# Patient Record
Sex: Male | Born: 1945 | Race: White | Hispanic: No | Marital: Single | Smoking: Former smoker
Health system: Southern US, Community
[De-identification: ages and names within clinical notes are randomized; demographics above are authoritative.]

## PROBLEM LIST (undated history)

## (undated) DIAGNOSIS — N200 Calculus of kidney: Secondary | ICD-10-CM

## (undated) DIAGNOSIS — I639 Cerebral infarction, unspecified: Secondary | ICD-10-CM

## (undated) DIAGNOSIS — I2510 Atherosclerotic heart disease of native coronary artery without angina pectoris: Secondary | ICD-10-CM

## (undated) DIAGNOSIS — I472 Ventricular tachycardia, unspecified: Secondary | ICD-10-CM

## (undated) DIAGNOSIS — I10 Essential (primary) hypertension: Secondary | ICD-10-CM

## (undated) DIAGNOSIS — Z95810 Presence of automatic (implantable) cardiac defibrillator: Secondary | ICD-10-CM

## (undated) DIAGNOSIS — Z9889 Other specified postprocedural states: Secondary | ICD-10-CM

## (undated) DIAGNOSIS — I4891 Unspecified atrial fibrillation: Secondary | ICD-10-CM

## (undated) HISTORY — PX: CARDIAC CATHETERIZATION: SHX172

## (undated) HISTORY — DX: Atherosclerotic heart disease of native coronary artery without angina pectoris: I25.10

## (undated) HISTORY — PX: CORONARY ANGIOPLASTY: SHX604

## (undated) HISTORY — PX: ICD IMPLANT: EP1208

---

## 2007-03-10 HISTORY — PX: OTHER SURGICAL HISTORY: SHX169

## 2014-02-06 DEATH — deceased

## 2014-10-16 HISTORY — PX: MITRAL VALVE REPAIR: SHX2039

## 2015-01-21 ENCOUNTER — Encounter: Payer: Self-pay | Admitting: *Deleted

## 2015-01-21 ENCOUNTER — Encounter: Payer: Medicare Other | Attending: Internal Medicine | Admitting: *Deleted

## 2015-01-21 VITALS — BP 110/60 | HR 43 | Ht 69.4 in | Wt 184.6 lb

## 2015-01-21 DIAGNOSIS — Z9889 Other specified postprocedural states: Secondary | ICD-10-CM | POA: Diagnosis not present

## 2015-01-21 NOTE — Progress Notes (Signed)
Cardiac Individual Treatment Plan  Patient Details  Name: Darius Miller MRN: [id number] Date of Birth: [birth year] Referring Provider:  Alwyn Pea, MD  Initial Encounter Date: Date: 01/21/15  Visit Diagnosis: s/p Mitral Valve repair  Patient's Home Medications on Admission:  Current outpatient prescriptions:  .  aspirin 81 MG tablet, Take 81 mg by mouth daily., Disp: , Rfl:  .  atorvastatin (LIPITOR) 40 MG tablet, Take 40 mg by mouth daily., Disp: , Rfl:  .  dabigatran (PRADAXA) 150 MG CAPS capsule, Take 150 mg by mouth 2 (two) times daily., Disp: , Rfl:  .  digoxin (LANOXIN) 0.125 MG tablet, Take 0.25 mg by mouth daily., Disp: , Rfl:  .  furosemide (LASIX) 40 MG tablet, Take 40 mg by mouth., Disp: , Rfl:  .  lisinopril (PRINIVIL,ZESTRIL) 2.5 MG tablet, Take 2.5 mg by mouth daily., Disp: , Rfl:  .  metoprolol succinate (TOPROL-XL) 25 MG 24 hr tablet, Take 12.5 mg by mouth daily., Disp: , Rfl:   Past Medical History: Past Medical History  Diagnosis Date  . Coronary artery disease     Tobacco Use: History  Smoking status  . Former Smoker -- 2.00 packs/day for 20 years  . Types: Cigarettes  Smokeless tobacco  . Not on file    Labs: Recent Review Flowsheet Data    There is no flowsheet data to display.       Exercise Target Goals: Date: 01/21/15  Exercise Program Goal: Individual exercise prescription set with THRR, safety & activity barriers. Participant demonstrates ability to understand and report RPE using BORG scale, to self-measure pulse accurately, and to acknowledge the importance of the exercise prescription.  Exercise Prescription Goal: Starting with aerobic activity 30 plus minutes a day, 3 days per week for initial exercise prescription. Provide home exercise prescription and guidelines that participant acknowledges understanding prior to discharge.  Activity Barriers & Risk Stratification:     Activity Barriers & Risk Stratification - 01/21/15  1454    Activity Barriers & Risk Stratification   Activity Barriers Arthritis   Risk Stratification High      6 Minute Walk:     6 Minute Walk      01/21/15 1342       6 Minute Walk   Phase Initial     Distance 1185 feet     Walk Time 6 minutes     Resting HR 43 bpm     Resting BP 110/60 mmHg     Max Ex. HR 84 bpm     Max Ex. BP 114/62 mmHg     RPE 11     Symptoms No        Initial Exercise Prescription:     Initial Exercise Prescription - 01/21/15 1300    Date of Initial Exercise Prescription   Date 01/21/15   Treadmill   MPH 2   Grade 0   Minutes 10   Bike   Level 0.4   Watts 15   Minutes 10   Recumbant Bike   Level 3   RPM 40   Watts 25   Minutes 15   NuStep   Level 3   Watts 25   Minutes 15   Arm Ergometer   Level 1   Watts 8   Minutes 10   Arm/Foot Ergometer   Level 4   Watts 15   Minutes 10   Cybex   Level 2   RPM 50   Minutes 10  Recumbant Elliptical   Level 1   RPM 40   Watts 10   Minutes 10   Elliptical   Level 1   Speed 3   Minutes 1   REL-XR   Level 2   Watts 35   Minutes 10   Prescription Details   Frequency (times per week) 3   Duration Progress to 30 minutes of continuous aerobic without signs/symptoms of physical distress   Intensity   THRR REST +  20   Ratings of Perceived Exertion 11-15   Progression Continue progressive overload as per policy without signs/symptoms or physical distress.   Resistance Training   Training Prescription Yes   Weight 2   Reps 10-15      Exercise Prescription Changes:   Discharge Exercise Prescription (Final Exercise Prescription Changes):   Nutrition:  Target Goals: Understanding of nutrition guidelines, daily intake of sodium 1500mg , cholesterol 200mg , calories 30% from fat and 7% or less from saturated fats, daily to have 5 or more servings of fruits and vegetables.  Biometrics:     Pre Biometrics - 01/21/15 1335    Pre Biometrics   Height 5' 9.4" (1.763 m)    Weight 184 lb 9.6 oz (83.734 kg)   Waist Circumference 39.75 inches   Hip Circumference 40 inches   Waist to Hip Ratio 0.99 %   BMI (Calculated) 27       Nutrition Therapy Plan and Nutrition Goals:     Nutrition Therapy & Goals - 01/21/15 1458    Nutrition Therapy   Drug/Food Interactions --  Darius Miller prefers not to meet individually with the Cardiac Rehab registered dietician.       Nutrition Discharge: Rate Your Plate Scores:   Nutrition Goals Re-Evaluation:   Psychosocial: Target Goals: Acknowledge presence or absence of depression, maximize coping skills, provide positive support system. Participant is able to verbalize types and ability to use techniques and skills needed for reducing stress and depression.  Initial Review & Psychosocial Screening:     Initial Psych Review & Screening - 01/21/15 1458    Initial Review   Current issues with Current Stress Concerns   Source of Stress Concerns Retirement/disability   Comments D[place] reported he was giving his wife info to relay when he was in the hospital since they were selling their house in [place] to move closer to [place] and [place]. He had to sell his mother's dining room set since moved from a 3 bedroom home to a 2 bedroom condo since he didn't feel he could keep up with the yeardwork etc. 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    Family Dynamics   Good Support System? Yes   Barriers   Psychosocial barriers to participate in program The patient should benefit from training in stress management and relaxation.   Screening Interventions   Interventions Encouraged to exercise      Quality of Life Scores:   PHQ-9:     Recent Review Flowsheet Data    Depression screen [facility] 2/9 01/21/2015   Decreased Interest 0   Down, Depressed, Hopeless 0   PHQ - 2 Score 0   Altered sleeping 0   Tired, decreased energy 3   Change in appetite 0   Feeling bad or  failure about yourself  0   Trouble concentrating 0   Moving slowly or fidgety/restless 0   Suicidal thoughts 0   PHQ-9 Score 3  Difficult doing work/chores Somewhat difficult      Psychosocial Evaluation and Intervention:   Psychosocial Re-Evaluation:   Vocational Rehabilitation: Provide vocational rehab assistance to qualifying candidates.   Vocational Rehab Evaluation & Intervention:     Vocational Rehab - 01/21/15 1405    Initial Vocational Rehab Evaluation & Intervention   Assessment shows need for Vocational Rehabilitation (p) No      Education: Education Goals: Education classes will be provided on a weekly basis, covering required topics. Participant will state understanding/return demonstration of topics presented.  Learning Barriers/Preferences:     Learning Barriers/Preferences - 01/21/15 1455    Learning Barriers/Preferences   Learning Barriers None   Learning Preferences None      Education Topics: General Nutrition Guidelines/Fats and Fiber: -Group instruction provided by verbal, written material, models and posters to present the general guidelines for heart healthy nutrition. Gives an explanation and review of dietary fats and fiber.   Controlling Sodium/Reading Food Labels: -Group verbal and written material supporting the discussion of sodium use in heart healthy nutrition. Review and explanation with models, verbal and written materials for utilization of the food label.   Exercise Physiology & Risk Factors: - Group verbal and written instruction with models to review the exercise physiology of the cardiovascular system and associated critical values. Details cardiovascular disease risk factors and the goals associated with each risk factor.   Aerobic Exercise & Resistance Training: - Gives group verbal and written discussion on the health impact of inactivity. On the components of aerobic and resistive training programs and the benefits of  this training and how to safely progress through these programs.   Flexibility, Balance, General Exercise Guidelines: - Provides group verbal and written instruction on the benefits of flexibility and balance training programs. Provides general exercise guidelines with specific guidelines to those with heart or lung disease. Demonstration and skill practice provided.   Stress Management: - Provides group verbal and written instruction about the health risks of elevated stress, cause of high stress, and healthy ways to reduce stress.   Depression: - Provides group verbal and written instruction on the correlation between heart/lung disease and depressed mood, treatment options, and the stigmas associated with seeking treatment.   Anatomy & Physiology of the Heart: - Group verbal and written instruction and models provide basic cardiac anatomy and physiology, with the coronary electrical and arterial systems. Review of: AMI, Angina, Valve disease, Heart Failure, Cardiac Arrhythmia, Pacemakers, and the ICD.   Cardiac Procedures: - Group verbal and written instruction and models to describe the testing methods done to diagnose heart disease. Reviews the outcomes of the test results. Describes the treatment choices: Medical Management, Angioplasty, or Coronary Bypass Surgery.   Cardiac Medications: - Group verbal and written instruction to review commonly prescribed medications for heart disease. Reviews the medication, class of the drug, and side effects. Includes the steps to properly store meds and maintain the prescription regimen.   Go Sex-Intimacy & Heart Disease, Get SMART - Goal Setting: - Group verbal and written instruction through game format to discuss heart disease and the return to sexual intimacy. Provides group verbal and written material to discuss and apply goal setting through the application of the S.M.A.R.T. Method.   Other Matters of the Heart: - Provides group  verbal, written materials and models to describe Heart Failure, Angina, Valve Disease, and Diabetes in the realm of heart disease. Includes description of the disease process and treatment options available to the cardiac patient.   Exercise &  Equipment Safety: - Individual verbal instruction and demonstration of equipment use and safety with use of the equipment.          Cardiac Rehab from 01/21/2015 in [facility]   Date  01/21/15   Educator  C. Amabel Stmarie,.RN   Instruction Review Code  2- meets goals/outcomes      Infection Prevention: - Provides verbal and written material to individual with discussion of infection control including proper hand washing and proper equipment cleaning during exercise session.      Cardiac Rehab from 01/21/2015 in [facility] Cardiac Rehab   Date  01/21/15   Educator  C. Woody Kronberg,RN   Instruction Review Code  2- meets goals/outcomes      Falls Prevention: - Provides verbal and written material to individual with discussion of falls prevention and safety.      Cardiac Rehab from 01/21/2015 in [facility] Cardiac [facility]   Date  01/21/15   Educator  C. EnterkinRN   Instruction Review Code  2- meets goals/outcomes      Diabetes: - Individual verbal and written instruction to review signs/symptoms of diabetes, desired ranges of glucose level fasting, after meals and with exercise. Advice that pre and post exercise glucose checks will be done for 3 sessions at entry of program.    Knowledge Questionnaire Score:     Knowledge Questionnaire Score - 01/21/15 1452    Knowledge Questionnaire Score   Pre Score 26      Personal Goals and Risk Factors at Admission:   Personal Goals and Risk Factors Review:    Personal Goals Discharge (Final Personal Goals and Risk Factors Review):     Comments:  Orientation/Medical Review appt 6 minute walk test done today with Fayrene Fearing on the telemetry monitor during that time and when we were explaining the program to  him. Spent over 30 minutes with him. Heart rate was 43 upon arrival so faxed info to Dr. Juliann Pares plus gave Fayrene Fearing a copy to carry to his MD since Trevonne reports he gets dizzy at times when he is leaning over tying his shoe etc.

## 2015-01-21 NOTE — Progress Notes (Signed)
Daily Session Note  Patient Details  Name: Darius Miller MRN: [id number] Date of Birth: January 12, 1946 Referring Provider:  Yolonda Kida, MD  Encounter Date: 01/21/2015  Check In:     Session Check In - 01/21/15 1452    Check-In   Staff Present Gerlene Burdock RN, BSN;Renee Dillard Essex MS, ACSM CEP Exercise Physiologist   ER physicians immediately available to respond to emergencies See telemetry face sheet for immediately available ER MD   Medication changes reported     No   Fall or balance concerns reported    No   Warm-up and Cool-down Not performed (comment)  6 min walk test done in the hallway.   VAD Patient? No   Pain Assessment   Currently in Pain? No/denies         Goals Met:  Personal goals reviewed No report of cardiac concerns or symptoms  Goals Unmet:  Not Applicable  Goals Comments: Orientation/Medical Review appt 6 minute walk test done today with Jeneen Rinks on the telemetry monitor during that time and when we were explaining the program to him. Spent over 30 minutes with him.    Dr. Emily Filbert is Medical Director for [facility] and [facility].

## 2015-01-21 NOTE — Patient Instructions (Signed)
Patient Instructions  Patient Details  Name: Darius Miller MRN: [id number] Date of Birth: [birth year] Referring Provider:  Alwyn Pea, MD  Below are the personal goals you chose as well as exercise and nutrition goals. Our goal is to help you keep on track towards obtaining and maintaining your goals. We will be discussing your progress on these goals with you throughout the program.  Initial Exercise Prescription:     Initial Exercise Prescription - 01/21/15 1300    Date of Initial Exercise Prescription   Date 01/21/15   Treadmill   MPH 2   Grade 0   Minutes 10   Bike   Level 0.4   Watts 15   Minutes 10   Recumbant Bike   Level 3   RPM 40   Watts 25   Minutes 15   NuStep   Level 3   Watts 25   Minutes 15   Arm Ergometer   Level 1   Watts 8   Minutes 10   Arm/Foot Ergometer   Level 4   Watts 15   Minutes 10   Cybex   Level 2   RPM 50   Minutes 10   Recumbant Elliptical   Level 1   RPM 40   Watts 10   Minutes 10   Elliptical   Level 1   Speed 3   Minutes 1   REL-XR   Level 2   Watts 35   Minutes 10   Prescription Details   Frequency (times per week) 3   Duration Progress to 30 minutes of continuous aerobic without signs/symptoms of physical distress   Intensity   THRR REST +  20   Ratings of Perceived Exertion 11-15   Progression Continue progressive overload as per policy without signs/symptoms or physical distress.   Resistance Training   Training Prescription Yes   Weight 2   Reps 10-15      Exercise Goals: Frequency: Be able to perform aerobic exercise three times per week working toward 3-5 days per week.  Intensity: Work with a perceived exertion of 11 (fairly light) - 15 (hard) as tolerated. Follow your new exercise prescription and watch for changes in prescription as you progress with the program. Changes will be reviewed with you when they are made.  Duration: You should be able to do 30 minutes of continuous aerobic  exercise in addition to a 5 minute warm-up and a 5 minute cool-down routine.  Nutrition Goals: Your personal nutrition goals will be established when you do your nutrition analysis with the dietician.  The following are nutrition guidelines to follow: Cholesterol < 200mg /day Sodium < 1500mg /day Fiber: Men over 50 yrs - 30 grams per day  Personal Goals:   Tobacco Use Initial Evaluation: History  Smoking status  . Former Smoker -- 2.00 packs/day for 20 years  . Types: Cigarettes  Smokeless tobacco  . Not on file    Copy of goals given to participant.

## 2015-01-23 ENCOUNTER — Encounter: Payer: Medicare Other | Admitting: *Deleted

## 2015-01-23 DIAGNOSIS — Z9889 Other specified postprocedural states: Secondary | ICD-10-CM | POA: Diagnosis not present

## 2015-01-23 NOTE — Progress Notes (Signed)
Daily Session Note  Patient Details  Name: Maxwel Meadowcroft MRN: [id number] Date of Birth: Mar 28, 1945 Referring Provider:  Yolonda Kida, MD  Encounter Date: 01/23/2015  Check In:     Session Check In - 01/23/15 1603    Check-In   Staff Present Candiss Norse MS, ACSM CEP Exercise Physiologist;Carroll Enterkin RN, BSN;Diane Joya Gaskins RN, BSN   ER physicians immediately available to respond to emergencies See telemetry face sheet for immediately available ER MD   Medication changes reported     No   Fall or balance concerns reported    No   Warm-up and Cool-down Performed on first and last piece of equipment   VAD Patient? No   Pain Assessment   Currently in Pain? No/denies   Pain Score 0-No pain         Goals Met:  Exercise tolerated well No report of cardiac concerns or symptoms Strength training completed today  Goals Unmet:  Not Applicable  Goals Comments:  Patient here today for education and exercise.  Tolerated session well.    Dr. Emily Filbert is Medical Director for [place] and [facility].

## 2015-01-24 DIAGNOSIS — Z9889 Other specified postprocedural states: Secondary | ICD-10-CM | POA: Diagnosis not present

## 2015-01-24 NOTE — Progress Notes (Signed)
Daily Session Note  Patient Details  Name: Darius Miller MRN: [id number] Date of Birth: Nov 17, 1945 Referring Provider:  Yolonda Kida, MD  Encounter Date: 01/24/2015  Check In:     Session Check In - 01/24/15 1609    Check-In   Staff Present Lestine Box BS, ACSM EP-C, Exercise Physiologist;Carroll Enterkin RN, BSN;Other   ER physicians immediately available to respond to emergencies See telemetry face sheet for immediately available ER MD   Medication changes reported     No   Fall or balance concerns reported    No   Warm-up and Cool-down Performed on first and last piece of equipment   VAD Patient? No   Pain Assessment   Currently in Pain? No/denies         Goals Met:  Proper associated with RPD/PD & O2 Sat Exercise tolerated well No report of cardiac concerns or symptoms Strength training completed today  Goals Unmet:  Not Applicable  Goals Comments:   Dr. Emily Filbert is Medical Director for [place] and [facility].

## 2015-01-28 ENCOUNTER — Encounter: Payer: Medicare Other | Admitting: *Deleted

## 2015-01-28 DIAGNOSIS — Z9889 Other specified postprocedural states: Secondary | ICD-10-CM | POA: Diagnosis not present

## 2015-01-28 NOTE — Progress Notes (Signed)
Daily Session Note  Patient Details  Name: Darius Miller MRN: [id number] Date of Birth: Jan 28, 1946 Referring Provider:  Yolonda Kida, MD  Encounter Date: 01/28/2015  Check In:     Session Check In - 01/28/15 1607    Check-In   Staff Present Heath Lark, RN, BSN, CCRP;Talha Iser, RN, BSN;Mary Kellie Shropshire, RN   ER physicians immediately available to respond to emergencies See telemetry face sheet for immediately available ER MD   Medication changes reported     No   Fall or balance concerns reported    No   Warm-up and Cool-down Performed on first and last piece of equipment   VAD Patient? No   Pain Assessment   Currently in Pain? No/denies         Goals Met:  Proper associated with RPD/PD & O2 Sat Exercise tolerated well  Goals Unmet:  Not Applicable  Goals Comments: MD told him to take 1/4 of Metoprolol and 1/2 of Digoxin.    Dr. Emily Filbert is Medical Director for [place] and [facility].

## 2015-01-30 ENCOUNTER — Encounter: Payer: Medicare Other | Admitting: *Deleted

## 2015-01-30 DIAGNOSIS — Z9889 Other specified postprocedural states: Secondary | ICD-10-CM

## 2015-01-30 NOTE — Progress Notes (Signed)
Daily Session Note  Patient Details  Name: Darius Miller MRN: [id number] Date of Birth: 1946-01-08 Referring Provider:  Yolonda Kida, MD  Encounter Date: 01/30/2015  Check In:     Session Check In - 01/30/15 1605    Check-In   Staff Present Gerlene Burdock, RN, BSN;Darric Plante Joya Gaskins, RN, BSN   ER physicians immediately available to respond to emergencies See telemetry face sheet for immediately available ER MD   Medication changes reported     Yes   Fall or balance concerns reported    No   Warm-up and Cool-down Performed on first and last piece of equipment   VAD Patient? No   Pain Assessment   Currently in Pain? No/denies         Goals Met:  Exercise tolerated well No report of cardiac concerns or symptoms Strength training completed today with no weights.  Patient stated the 2 pound weights bothered his leg and hip from the last session due to arthritis in his knees.    Goals Unmet:  Not Applicable  Goals Comments: Patient reports having stopped Digoxin.  MD discontinued this med.  Patient states his breathing is better at night since he started this program.    Dr. Emily Filbert is Medical Director for [place] and [facility].

## 2015-02-04 DIAGNOSIS — Z9889 Other specified postprocedural states: Secondary | ICD-10-CM | POA: Diagnosis not present

## 2015-02-04 NOTE — Progress Notes (Signed)
Daily Session Note  Patient Details  Name: Darius Miller MRN: [id number] Date of Birth: 11-Jul-1945 Referring Provider:  Yolonda Kida, MD  Encounter Date: 02/04/2015  Check In:     Session Check In - 02/04/15 1628    Check-In   Staff Present Heath Lark, RN, BSN, CCRP;Carroll Enterkin, RN, BSN;Bradee Common, BS, ACSM EP-C, Exercise Physiologist   ER physicians immediately available to respond to emergencies See telemetry face sheet for immediately available ER MD   Medication changes reported     No   Fall or balance concerns reported    No   Warm-up and Cool-down Performed on first and last piece of equipment   VAD Patient? No   Pain Assessment   Currently in Pain? No/denies         Goals Met:  Proper associated with RPD/PD & O2 Sat Exercise tolerated well No report of cardiac concerns or symptoms Strength training completed today  Goals Unmet:  Not Applicable  Goals Comments:   Dr. Emily Filbert is Medical Director for [place] and [facility].

## 2015-02-06 ENCOUNTER — Encounter: Payer: Medicare Other | Admitting: *Deleted

## 2015-02-06 DIAGNOSIS — Z9889 Other specified postprocedural states: Secondary | ICD-10-CM

## 2015-02-06 NOTE — Progress Notes (Signed)
Daily Session Note  Patient Details  Name: Darius Miller MRN: [id number] Date of Birth: February 05, 1946 Referring Provider:  Yolonda Kida, MD  Encounter Date: 02/06/2015  Check In:     Session Check In - 02/06/15 1613    Check-In   Staff Present Gerlene Burdock, RN, Drusilla Kanner, MS, ACSM CEP, Exercise Physiologist;Diane Joya Gaskins, RN, BSN   ER physicians immediately available to respond to emergencies See telemetry face sheet for immediately available ER MD   Medication changes reported     No   Fall or balance concerns reported    No   Warm-up and Cool-down Performed on first and last piece of equipment   VAD Patient? No   Pain Assessment   Currently in Pain? No/denies   Multiple Pain Sites No         Goals Met:  Independence with exercise equipment Exercise tolerated well Personal goals reviewed No report of cardiac concerns or symptoms Strength training completed today  Goals Unmet:  Not Applicable  Goals Comments: Patient completed exercise prescription and all exercise goals during rehab session. The exercise was tolerated well and the patient is progressing in the program.    Dr. Emily Filbert is Medical Director for [place] and [facility].

## 2015-02-07 ENCOUNTER — Encounter: Payer: BLUE CROSS/BLUE SHIELD | Attending: Internal Medicine

## 2015-02-07 DIAGNOSIS — Z9889 Other specified postprocedural states: Secondary | ICD-10-CM | POA: Insufficient documentation

## 2015-02-07 NOTE — Progress Notes (Signed)
Daily Session Note  Patient Details  Name: Darius Miller MRN: [id number] Date of Birth: November 03, 1945 Referring Provider:  Yolonda Kida, MD  Encounter Date: 02/07/2015  Check In:     Session Check In - 02/07/15 1631    Check-In   Staff Present Gerlene Burdock, RN, BSN;Diane Joya Gaskins, RN, BSN;Caldonia Leap, BS, ACSM EP-C, Exercise Physiologist   ER physicians immediately available to respond to emergencies See telemetry face sheet for immediately available ER MD   Medication changes reported     No   Fall or balance concerns reported    No   Warm-up and Cool-down Performed on first and last piece of equipment   VAD Patient? No   Pain Assessment   Currently in Pain? No/denies         Goals Met:  Proper associated with RPD/PD & O2 Sat Exercise tolerated well No report of cardiac concerns or symptoms Strength training completed today  Goals Unmet:  Not Applicable  Goals Comments:    Dr. Emily Filbert is Medical Director for [place] and [facility].

## 2015-02-11 DIAGNOSIS — Z9889 Other specified postprocedural states: Secondary | ICD-10-CM | POA: Diagnosis not present

## 2015-02-11 NOTE — Progress Notes (Signed)
Daily Session Note  Patient Details  Name: Darius Miller MRN: [id number] Date of Birth: April 09, 1945 Referring Provider:  Yolonda Kida, MD  Encounter Date: 02/11/2015  Check In:     Session Check In - 02/11/15 1612    Check-In   Staff Present Heath Lark, RN, BSN, CCRP;Carroll Enterkin, RN, BSN;Erum Cercone, BS, ACSM EP-C, Exercise Physiologist   ER physicians immediately available to respond to emergencies See telemetry face sheet for immediately available ER MD   Medication changes reported     No   Fall or balance concerns reported    No   Warm-up and Cool-down Performed on first and last piece of equipment   VAD Patient? No   Pain Assessment   Currently in Pain? No/denies         Goals Met:  Proper associated with RPD/PD & O2 Sat Exercise tolerated well No report of cardiac concerns or symptoms Strength training completed today  Goals Unmet:  Not Applicable  Goals Comments:    Dr. Emily Filbert is Medical Director for [facility].

## 2015-02-12 NOTE — Progress Notes (Signed)
Cardiac Individual Treatment Plan  Patient Details  Name: Darius Miller MRN: [id number] Date of Birth: Jun 09, 1945 Referring Provider:  Yolonda Kida, MD  Initial Encounter Date:    Visit Diagnosis: S/P mitral valve repair  Patient's Home Medications on Admission:  Current outpatient prescriptions:  .  aspirin 81 MG tablet, Take 81 mg by mouth daily., Disp: , Rfl:  .  atorvastatin (LIPITOR) 40 MG tablet, Take 40 mg by mouth daily., Disp: , Rfl:  .  dabigatran (PRADAXA) 150 MG CAPS capsule, Take 150 mg by mouth 2 (two) times daily., Disp: , Rfl:  .  digoxin (LANOXIN) 0.125 MG tablet, Take 0.25 mg by mouth daily., Disp: , Rfl:  .  furosemide (LASIX) 40 MG tablet, Take 40 mg by mouth., Disp: , Rfl:  .  lisinopril (PRINIVIL,ZESTRIL) 2.5 MG tablet, Take 2.5 mg by mouth daily., Disp: , Rfl:  .  metoprolol succinate (TOPROL-XL) 25 MG 24 hr tablet, Take 12.5 mg by mouth daily., Disp: , Rfl:   Past Medical History: Past Medical History  Diagnosis Date  . Coronary artery disease     Tobacco Use: History  Smoking status  . Former Smoker -- 2.00 packs/day for 20 years  . Types: Cigarettes  Smokeless tobacco  . Not on file    Labs: Recent Review Flowsheet Data    There is no flowsheet data to display.       Exercise Target Goals:    Exercise Program Goal: Individual exercise prescription set with THRR, safety & activity barriers. Participant demonstrates ability to understand and report RPE using BORG scale, to self-measure pulse accurately, and to acknowledge the importance of the exercise prescription.  Exercise Prescription Goal: Starting with aerobic activity 30 plus minutes a day, 3 days per week for initial exercise prescription. Provide home exercise prescription and guidelines that participant acknowledges understanding prior to discharge.  Activity Barriers & Risk Stratification:     Activity Barriers & Risk Stratification - 01/21/15 1454    Activity Barriers  & Risk Stratification   Activity Barriers Arthritis   Risk Stratification High      6 Minute Walk:     6 Minute Walk      01/21/15 1342       6 Minute Walk   Phase Initial     Distance 1185 feet     Walk Time 6 minutes     Resting HR 43 bpm     Resting BP 110/60 mmHg     Max Ex. HR 84 bpm     Max Ex. BP 114/62 mmHg     RPE 11     Symptoms No        Initial Exercise Prescription:     Initial Exercise Prescription - 01/21/15 1300    Date of Initial Exercise Prescription   Date 01/21/15   Treadmill   MPH 2   Grade 0   Minutes 10   Bike   Level 0.4   Watts 15   Minutes 10   Recumbant Bike   Level 3   RPM 40   Watts 25   Minutes 15   NuStep   Level 3   Watts 25   Minutes 15   Arm Ergometer   Level 1   Watts 8   Minutes 10   Arm/Foot Ergometer   Level 4   Watts 15   Minutes 10   Cybex   Level 2   RPM 50   Minutes 10  Recumbant Elliptical   Level 1   RPM 40   Watts 10   Minutes 10   Elliptical   Level 1   Speed 3   Minutes 1   REL-XR   Level 2   Watts 35   Minutes 10   Prescription Details   Frequency (times per week) 3   Duration Progress to 30 minutes of continuous aerobic without signs/symptoms of physical distress   Intensity   THRR REST +  20   Ratings of Perceived Exertion 11-15   Progression Continue progressive overload as per policy without signs/symptoms or physical distress.   Resistance Training   Training Prescription Yes   Weight 2   Reps 10-15      Exercise Prescription Changes:     Exercise Prescription Changes      01/23/15 1600 02/04/15 1437         Exercise Review   Progression  Yes      Response to Exercise   Blood Pressure (Admit)  108/74 mmHg      Blood Pressure (Exercise)  134/64 mmHg      Blood Pressure (Exit)  110/70 mmHg      Heart Rate (Admit)  86 bpm      Heart Rate (Exercise)  105 bpm      Heart Rate (Exit)  88 bpm      Rating of Perceived Exertion (Exercise)  13      Symptoms None None       Comments First day of exercise! Patient was oriented to the gym and the equipment functions and settings. Procedures and policies of the gym were outlined and explained. The patient's individual exercise prescription and treatment plan were reviewed with them. All starting workloads were established based on the results of the functional testing  done at the initial intake visit. The plan for exercise progression was also introduced and progression will be customized based on the patient's performance and goals.  Reviewed individualized exercise prescription and made increases per departmental policy. Exercise increases were discussed with the patient and they were able to perform the new work loads without issue (no signs or symptoms).      Duration Progress to 30 minutes of continuous aerobic without signs/symptoms of physical distress Progress to 50 minutes of aerobic without signs/symptoms of physical distress      Intensity Other (comment)  Rest +20 Rest + 30  Rest +20      Progression Continue progressive overload as per policy without signs/symptoms or physical distress. Continue progressive overload as per policy without signs/symptoms or physical distress.      Resistance Training   Training Prescription Yes Yes      Weight 2 2      Reps 10-15 10-15      Interval Training   Interval Training No No      Treadmill   MPH  2.5      Grade  0      Minutes  15      REL-XR   Level  4      Watts  65      Minutes  20         Discharge Exercise Prescription (Final Exercise Prescription Changes):     Exercise Prescription Changes - 02/04/15 1437    Exercise Review   Progression Yes   Response to Exercise   Blood Pressure (Admit) 108/74 mmHg   Blood Pressure (Exercise) 134/64 mmHg   Blood Pressure (Exit) 110/70  mmHg   Heart Rate (Admit) 86 bpm   Heart Rate (Exercise) 105 bpm   Heart Rate (Exit) 88 bpm   Rating of Perceived Exertion (Exercise) 13   Symptoms None   Comments  Reviewed individualized exercise prescription and made increases per departmental policy. Exercise increases were discussed with the patient and they were able to perform the new work loads without issue (no signs or symptoms).   Duration Progress to 50 minutes of aerobic without signs/symptoms of physical distress   Intensity Rest + 30  Rest +20   Progression Continue progressive overload as per policy without signs/symptoms or physical distress.   Resistance Training   Training Prescription Yes   Weight 2   Reps 10-15   Interval Training   Interval Training No   Treadmill   MPH 2.5   Grade 0   Minutes 15   REL-XR   Level 4   Watts 65   Minutes 20      Nutrition:  Target Goals: Understanding of nutrition guidelines, daily intake of sodium <156m, cholesterol <2040m calories 30% from fat and 7% or less from saturated fats, daily to have 5 or more servings of fruits and vegetables.  Biometrics:     Pre Biometrics - 01/21/15 1335    Pre Biometrics   Height 5' 9.4" (1.763 m)   Weight 184 lb 9.6 oz (83.734 kg)   Waist Circumference 39.75 inches   Hip Circumference 40 inches   Waist to Hip Ratio 0.99 %   BMI (Calculated) 27       Nutrition Therapy Plan and Nutrition Goals:     Nutrition Therapy & Goals - 01/21/15 1458    Nutrition Therapy   Drug/Food Interactions --  JaAlyssarefers not to meet individually with the Cardiac Rehab registered dietician.       Nutrition Discharge: Rate Your Plate Scores:   Nutrition Goals Re-Evaluation:   Psychosocial: Target Goals: Acknowledge presence or absence of depression, maximize coping skills, provide positive support system. Participant is able to verbalize types and ability to use techniques and skills needed for reducing stress and depression.  Initial Review & Psychosocial Screening:     Initial Psych Review & Screening - 01/21/15 1458    Initial Review   Current issues with Current Stress Concerns   Source of  Stress Concerns Retirement/disability   Comments JaAlhassaneported he was giving his wife info to relay when he was in the hospital since [place] sell his mother's dining room set since moved from a 3 bedroom home to a 2 bedroom condo since he didn't feel he could keep up with the yeardwork etc. 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    Family Dynamics   Good Support System? Yes   Barriers   Psychosocial barriers to participate in program The patient should benefit from training in stress management and relaxation.   Screening Interventions   Interventions Encouraged to exercise      Quality of Life Scores:   PHQ-9:     Recent Review Flowsheet Data    [facility] 01[facility]   Decreased Interest 0   Down, Depressed, Hopeless 0   PHQ - 2 Score 0   Altered sleeping 0   Tired, decreased energy 3   Change in appetite 0   Feeling bad or failure about  yourself  0   Trouble concentrating 0   Moving slowly or fidgety/restless 0   Suicidal thoughts 0   PHQ-9 Score 3   Difficult doing work/chores Somewhat difficult      Psychosocial Evaluation and Intervention:     Psychosocial Evaluation - 01/30/15 1639    Psychosocial Evaluation & Interventions   Interventions Stress management education;Relaxation education;Encouraged to exercise with the program and follow exercise prescription   Comments Counselor met with Mr. Pressley today for initial psychosocial evaluation.  He is a well-adjusted 69 year old who had cardiac surgery in August.  He has a strong support system with a fiance and adult children/grandchildren who live close by.  Mr. Plucinski reports having kidney stones and some pulmonary issues as well as his cardiac illness.  He states that he sleeps well for the most part and has a good appetite.  He denies a history of depression or  anxiety or current symptoms.  He states he is typically in a good mood and loves to be on the golf course.  Current stress in [place] life are [place]  His goals for this program are to lose weight, to increase his stamina and strength and increase his energy level.  Counselor will follow with Mr. Mavis as needed.    Continued Psychosocial Services Needed Yes  Mr. Petta will benefit from the psychoeducational components of this program, especially stress management and relaxation due to his recent move.  He will also benefit from meeting with the dietician for weight loss goal to be addressed.      Psychosocial Re-Evaluation:   Vocational Rehabilitation: Provide vocational rehab assistance to qualifying candidates.   Vocational Rehab Evaluation & Intervention:     Vocational Rehab - 01/21/15 1405    Initial Vocational Rehab Evaluation & Intervention   Assessment shows need for Vocational Rehabilitation (p) No      Education: Education Goals: Education classes will be provided on a weekly basis, covering required topics. Participant will state understanding/return demonstration of topics presented.  Learning Barriers/Preferences:     Learning Barriers/Preferences - 01/21/15 1455    Learning Barriers/Preferences   Learning Barriers None   Learning Preferences None      Education Topics: General Nutrition Guidelines/Fats and Fiber: -Group instruction provided by verbal, written material, models and posters to present the general guidelines for heart healthy nutrition. Gives an explanation and review of dietary fats and fiber.          Cardiac Rehab from 02/11/2015 in [facility]   Date  02/11/15   Educator  PI   Instruction Review Code  2- meets goals/outcomes      Controlling Sodium/Reading Food Labels: -Group verbal and written material supporting the discussion of sodium use in heart healthy nutrition. Review and explanation  with models, verbal and written materials for utilization of the food label.   Exercise Physiology & Risk Factors: - Group verbal and written instruction with models to review the exercise physiology of the cardiovascular system and associated critical values. Details cardiovascular disease risk factors and the goals associated with each risk factor.   Aerobic Exercise & Resistance Training: - Gives group verbal and written discussion on the health impact of inactivity. On the components of aerobic and resistive training programs and the benefits of this training and how to safely progress through these programs.   Flexibility, Balance, General Exercise Guidelines: - Provides group verbal and written instruction on the  benefits of flexibility and balance training programs. Provides general exercise guidelines with specific guidelines to those with heart or lung disease. Demonstration and skill practice provided.   Stress Management: - Provides group verbal and written instruction about the health risks of elevated stress, cause of high stress, and healthy ways to reduce stress.      Cardiac Rehab from 02/11/2015 in [facility]   Date  01/23/15   Educator  Kathreen Cornfield   Instruction Review Code  2- meets goals/outcomes      Depression: - Provides group verbal and written instruction on the correlation between heart/lung disease and depressed mood, treatment options, and the stigmas associated with seeking treatment.   Anatomy & Physiology of the Heart: - Group verbal and written instruction and models provide basic cardiac anatomy and physiology, with the coronary electrical and arterial systems. Review of: AMI, Angina, Valve disease, Heart Failure, Cardiac Arrhythmia, Pacemakers, and the ICD.      Cardiac Rehab from 02/11/2015 in [facility] Cardiac Rehab   Date  01/28/15   Educator  S. Christina Waldrop, RN   Instruction Review Code  2- meets goals/outcomes      Cardiac Procedures: - Group  verbal and written instruction and models to describe the testing methods done to diagnose heart disease. Reviews the outcomes of the test results. Describes the treatment choices: Medical Management, Angioplasty, or Coronary Bypass Surgery.   Cardiac Medications: - Group verbal and written instruction to review commonly prescribed medications for heart disease. Reviews the medication, class of the drug, and side effects. Includes the steps to properly store meds and maintain the prescription regimen.   Go Sex-Intimacy & Heart Disease, Get SMART - Goal Setting: - Group verbal and written instruction through game format to discuss heart disease and the return to sexual intimacy. Provides group verbal and written material to discuss and apply goal setting through the application of the S.M.A.R.T. Method.   Other Matters of the Heart: - Provides group verbal, written materials and models to describe Heart Failure, Angina, Valve Disease, and Diabetes in the realm of heart disease. Includes description of the disease process and treatment options available to the cardiac patient.      Cardiac Rehab from 02/11/2015 in [facility] Cardiac Rehab   Date  02/06/15   Educator  DW   Instruction Review Code  2- meets goals/outcomes      Exercise & Equipment Safety: - Individual verbal instruction and demonstration of equipment use and safety with use of the equipment.      Cardiac Rehab from 02/11/2015 in [facility] Cardiac Rehab   Date  01/21/15   Educator  C. Enterkin,.RN   Instruction Review Code  2- meets goals/outcomes      Infection Prevention: - Provides verbal and written material to individual with discussion of infection control including proper hand washing and proper equipment cleaning during exercise session.      Cardiac Rehab from 02/11/2015 in [facility] Cardiac Rehab   Date  01/21/15   Educator  C. Enterkin,RN   Instruction Review Code  2- meets goals/outcomes      Falls Prevention: - Provides  verbal and written material to individual with discussion of falls prevention and safety.      Cardiac Rehab from 02/11/2015 in [facility] Cardiac Rehab   Date  01/21/15   Educator  C. EnterkinRN   Instruction Review Code  2- meets goals/outcomes      Diabetes: - Individual verbal and written instruction to review signs/symptoms of diabetes,  desired ranges of glucose level fasting, after meals and with exercise. Advice that pre and post exercise glucose checks will be done for 3 sessions at entry of program.    Knowledge Questionnaire Score:     Knowledge Questionnaire Score - 01/21/15 1452    Knowledge Questionnaire Score   Pre Score 26      Personal Goals and Risk Factors at Admission:   Personal Goals and Risk Factors Review:      Goals and Risk Factor Review      01/30/15 1606 01/30/15 1628         Weight Management   Goals Progress/Improvement seen Yes       Comments Patient's initial weight was 184.6.  Past three sessions weight down to 183.3.  Today patient has a thick sweat shirt and weight is 185.  Patient is walking his little dogs at home several times a day.  Patient still wants to lose a few pounds.  He plans to play golf on the day after Thanksgiving.         Increase Aerobic Exercise and Physical Activity   Goals Progress/Improvement seen  Yes       Comments Patient states he feels the biggest difference when he goes to bed at night.  He states he is not as short of breath at night.         Take Less Medication   Goals Progress/Improvement seen Met       Comments  MD discontinued Digoxin and patient stopped taking Digoxin on November 22nd, 2016.         Hypertension   Progress seen toward goals  Yes      Comments  BP is well controlled.  BP 110 - 124 / 60 - 70.        Abnormal Lipids   Progress seen towards goals  Unknown      Comments  no new labs since starting Cardiac Rehab.           Personal Goals Discharge (Final Personal Goals and Risk Factors  Review):      Goals and Risk Factor Review - 01/30/15 1628    Hypertension   Progress seen toward goals Yes   Comments BP is well controlled.  BP 110 - 124 / 60 - 70.     Abnormal Lipids   Progress seen towards goals Unknown   Comments no new labs since starting Cardiac Rehab.        ITP Comments:     ITP Comments      02/12/15 1310           ITP Comments 30 day review preparation  Continue with ITP          Comments:

## 2015-02-13 ENCOUNTER — Encounter: Payer: BLUE CROSS/BLUE SHIELD | Admitting: *Deleted

## 2015-02-13 DIAGNOSIS — Z9889 Other specified postprocedural states: Secondary | ICD-10-CM | POA: Diagnosis not present

## 2015-02-13 NOTE — Addendum Note (Signed)
Addended by: Virgina Organ on: 02/13/2015 11:18 AM   Modules accepted: Orders

## 2015-02-13 NOTE — Progress Notes (Signed)
Cardiac Individual Treatment Plan  Patient Details  Name: Darius Miller MRN: [id number] Date of Birth: [birth year] Referring Provider:  Yolonda Kida, MD  Initial Encounter Date:    Visit Diagnosis: S/P mitral valve repair  Patient's Home Medications on Admission:  Current outpatient prescriptions:  .  aspirin 81 MG tablet, Take 81 mg by mouth daily., Disp: , Rfl:  .  atorvastatin (LIPITOR) 40 MG tablet, Take 40 mg by mouth daily., Disp: , Rfl:  .  dabigatran (PRADAXA) 150 MG CAPS capsule, Take 150 mg by mouth 2 (two) times daily., Disp: , Rfl:  .  digoxin (LANOXIN) 0.125 MG tablet, Take 0.25 mg by mouth daily., Disp: , Rfl:  .  furosemide (LASIX) 40 MG tablet, Take 40 mg by mouth., Disp: , Rfl:  .  lisinopril (PRINIVIL,ZESTRIL) 2.5 MG tablet, Take 2.5 mg by mouth daily., Disp: , Rfl:  .  metoprolol succinate (TOPROL-XL) 25 MG 24 hr tablet, Take 12.5 mg by mouth daily., Disp: , Rfl:   Past Medical History: Past Medical History  Diagnosis Date  . Coronary artery disease     Tobacco Use: History  Smoking status  . Former Smoker -- 2.00 packs/day for 20 years  . Types: Cigarettes  Smokeless tobacco  . Not on file    Labs: Recent Review Flowsheet Data    There is no flowsheet data to display.       Exercise Target Goals:    Exercise Program Goal: Individual exercise prescription set with THRR, safety & activity barriers. Participant demonstrates ability to understand and report RPE using BORG scale, to self-measure pulse accurately, and to acknowledge the importance of the exercise prescription.  Exercise Prescription Goal: Starting with aerobic activity 30 plus minutes a day, 3 days per week for initial exercise prescription. Provide home exercise prescription and guidelines that participant acknowledges understanding prior to discharge.  Activity Barriers & Risk Stratification:     Activity Barriers & Risk Stratification - 01/21/15 69    Activity Barriers  & Risk Stratification   Activity Barriers Arthritis   Risk Stratification High      6 Minute Walk:     6 Minute Walk      01/21/15 68       6 Minute Walk   Phase Initial     Distance 1185 feet     Walk Time 6 minutes     Resting HR 43 bpm     Resting BP 110/60 mmHg     Max Ex. HR 84 bpm     Max Ex. BP 114/62 mmHg     RPE 11     Symptoms No        Initial Exercise Prescription:     Initial Exercise Prescription - 01/21/15 69    Date of Initial Exercise Prescription   Date 01/21/15   Treadmill   MPH 2   Grade 0   Minutes 10   Bike   Level 0.4   Watts 15   Minutes 10   Recumbant Bike   Level 3   RPM 40   Watts 25   Minutes 15   NuStep   Level 3   Watts 25   Minutes 15   Arm Ergometer   Level 1   Watts 8   Minutes 10   Arm/Foot Ergometer   Level 4   Watts 15   Minutes 10   Cybex   Level 2   RPM 50   Minutes 10  Recumbant Elliptical   Level 1   RPM 40   Watts 10   Minutes 10   Elliptical   Level 1   Speed 3   Minutes 1   REL-XR   Level 2   Watts 35   Minutes 10   Prescription Details   Frequency (times per week) 3   Duration Progress to 30 minutes of continuous aerobic without signs/symptoms of physical distress   Intensity   THRR REST +  20   Ratings of Perceived Exertion 11-15   Progression Continue progressive overload as per policy without signs/symptoms or physical distress.   Resistance Training   Training Prescription Yes   Weight 2   Reps 10-15      Exercise Prescription Changes:     Exercise Prescription Changes      01/23/15 1600 02/04/15 69         Exercise Review   Progression  Yes      Response to Exercise   Blood Pressure (Admit)  108/74 mmHg      Blood Pressure (Exercise)  134/64 mmHg      Blood Pressure (Exit)  110/70 mmHg      Heart Rate (Admit)  86 bpm      Heart Rate (Exercise)  105 bpm      Heart Rate (Exit)  88 bpm      Rating of Perceived Exertion (Exercise)  13      Symptoms None None       Comments First day of exercise! Patient was oriented to the gym and the equipment functions and settings. Procedures and policies of the gym were outlined and explained. The patient's individual exercise prescription and treatment plan were reviewed with them. All starting workloads were established based on the results of the functional testing  done at the initial intake visit. The plan for exercise progression was also introduced and progression will be customized based on the patient's performance and goals.  Reviewed individualized exercise prescription and made increases per departmental policy. Exercise increases were discussed with the patient and they were able to perform the new work loads without issue (no signs or symptoms).      Duration Progress to 30 minutes of continuous aerobic without signs/symptoms of physical distress Progress to 50 minutes of aerobic without signs/symptoms of physical distress      Intensity Other (comment)  Rest +20 Rest + 30  Rest +20      Progression Continue progressive overload as per policy without signs/symptoms or physical distress. Continue progressive overload as per policy without signs/symptoms or physical distress.      Resistance Training   Training Prescription Yes Yes      Weight 2 2      Reps 10-15 10-15      Interval Training   Interval Training No No      Treadmill   MPH  2.5      Grade  0      Minutes  15      REL-XR   Level  4      Watts  65      Minutes  20         Discharge Exercise Prescription (Final Exercise Prescription Changes): 68     Exercise Prescription Changes - 02/04/15 1437    Exercise Review   Progression Yes   Response to Exercise   Blood Pressure (Admit) 108/74 mmHg   Blood Pressure (Exercise) 134/64 mmHg   Blood Pressure (Exit) 110/70  mmHg   Heart Rate (Admit) 86 bpm   Heart Rate (Exercise) 105 bpm   Heart Rate (Exit) 88 bpm   Rating of Perceived Exertion (Exercise) 13   Symptoms None   Comments  Reviewed individualized exercise prescription and made increases per departmental policy. Exercise increases were discussed with the patient and they were able to perform the new work loads without issue (no signs or symptoms).   Duration Progress to 50 minutes of aerobic without signs/symptoms of physical distress   Intensity Rest + 30  Rest +20   Progression Continue progressive overload as per policy without signs/symptoms or physical distress.   Resistance Training   Training Prescription Yes   Weight 2   Reps 10-15   Interval Training   Interval Training No   Treadmill   MPH 2.5   Grade 0   Minutes 15   REL-XR   Level 4   Watts 65   Minutes 20      Nutrition:  Target Goals: Understanding of nutrition guidelines, daily intake of sodium <156m, cholesterol <2040m calories 30% from fat and 7% or less from saturated fats, daily to have 5 or more servings of fruits and vegetables.  Biometrics:     Pre Biometrics - 01/21/15 1335    Pre Biometrics   Height 5' 9.4" (1.763 m)   Weight 184 lb 9.6 oz (83.734 kg)   Waist Circumference 39.75 inches   Hip Circumference 40 inches   Waist to Hip Ratio 0.99 %   BMI (Calculated) 27       Nutrition Therapy Plan and Nutrition Goals:     Nutrition Therapy & Goals - 01/21/15 1458    Nutrition Therapy   Drug/Food Interactions --  JaAlyssarefers not to meet individually with the Cardiac Rehab registered dietician.       Nutrition Discharge: Rate Your Plate Scores:   Nutrition Goals Re-Evaluation:   Psychosocial: Target Goals: Acknowledge presence or absence of depression, maximize coping skills, provide positive support system. Participant is able to verbalize types and ability to use techniques and skills needed for reducing stress and depression.  Initial Review & Psychosocial Screening:     Initial Psych Review & Screening - 01/21/15 1458    Initial Review   Current issues with Current Stress Concerns   Source of  Stress Concerns Retirement/disability   Comments JaAlhassaneported he was giving his wife info to relay when he was in the hospital since [place] to sell his mother's dining room set since moved from a 3 bedroom home to a 2 bedroom condo since he didn't feel he could keep up with the yeardwork etc. 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    Family Dynamics   Good Support System? Yes   Barriers   Psychosocial barriers to participate in program The patient should benefit from training in stress management and relaxation.   Screening Interventions   Interventions Encouraged to exercise      Quality of Life Scores:   PHQ-9:     Recent Review Flowsheet Data    [facility] 01/[facility]   Decreased Interest 0   Down, Depressed, Hopeless 0   PHQ - 2 Score 0   Altered sleeping 0   Tired, decreased energy 3   Change in appetite 0   Feeling bad or failure about  yourself  0   Trouble concentrating 0   Moving slowly or fidgety/restless 0   Suicidal thoughts 0   PHQ-9 Score 3   Difficult doing work/chores Somewhat difficult      Psychosocial Evaluation and Intervention:     Psychosocial Evaluation - 01/30/15 1639    Psychosocial Evaluation & Interventions   Interventions Stress management education;Relaxation education;Encouraged to exercise with the program and follow exercise prescription   Comments Counselor met with Mr. Pressley today for initial psychosocial evaluation.  He is a well-adjusted 69 year old who had cardiac surgery in August.  He has a strong support system with a fiance and adult children/grandchildren who live close by.  Mr. Plucinski reports having kidney stones and some pulmonary issues as well as his cardiac illness.  He states that he sleeps well for the most part and has a good appetite.  He denies a history of depression or  anxiety or current symptoms.  He states he is typically in a good mood and loves to be on the golf course.  Current stress in [place] life [place]  His goals for this program are to lose weight, to increase his stamina and strength and increase his energy level.  Counselor will follow with Mr. Mavis as needed.    Continued Psychosocial Services Needed Yes  Mr. Petta will benefit from the psychoeducational components of this program, especially stress management and relaxation due to his recent move.  He will also benefit from meeting with the dietician for weight loss goal to be addressed.      Psychosocial Re-Evaluation:   Vocational Rehabilitation: Provide vocational rehab assistance to qualifying candidates.   Vocational Rehab Evaluation & Intervention:     Vocational Rehab - 01/21/15 1405    Initial Vocational Rehab Evaluation & Intervention   Assessment shows need for Vocational Rehabilitation (p) No      Education: Education Goals: Education classes will be provided on a weekly basis, covering required topics. Participant will state understanding/return demonstration of topics presented.  Learning Barriers/Preferences:     Learning Barriers/Preferences - 01/21/15 1455    Learning Barriers/Preferences   Learning Barriers None   Learning Preferences None      Education Topics: General Nutrition Guidelines/Fats and Fiber: -Group instruction provided by verbal, written material, models and posters to present the general guidelines for heart healthy nutrition. Gives an explanation and review of dietary fats and fiber.          Cardiac Rehab from 02/11/2015 in [facility] Cardiac Rehab   Date  02/11/15   Educator  PI   Instruction Review Code  2- meets goals/outcomes      Controlling Sodium/Reading Food Labels: -Group verbal and written material supporting the discussion of sodium use in heart healthy nutrition. Review and explanation  with models, verbal and written materials for utilization of the food label.   Exercise Physiology & Risk Factors: - Group verbal and written instruction with models to review the exercise physiology of the cardiovascular system and associated critical values. Details cardiovascular disease risk factors and the goals associated with each risk factor.   Aerobic Exercise & Resistance Training: - Gives group verbal and written discussion on the health impact of inactivity. On the components of aerobic and resistive training programs and the benefits of this training and how to safely progress through these programs.   Flexibility, Balance, General Exercise Guidelines: - Provides group verbal and written instruction on the  benefits of flexibility and balance training programs. Provides general exercise guidelines with specific guidelines to those with heart or lung disease. Demonstration and skill practice provided.   Stress Management: - Provides group verbal and written instruction about the health risks of elevated stress, cause of high stress, and healthy ways to reduce stress.      Cardiac Rehab from 02/11/2015 in [facility] Cardiac Rehab   Date  01/23/15   Educator  Kathreen Cornfield   Instruction Review Code  2- meets goals/outcomes      Depression: - Provides group verbal and written instruction on the correlation between heart/lung disease and depressed mood, treatment options, and the stigmas associated with seeking treatment.   Anatomy & Physiology of the Heart: - Group verbal and written instruction and models provide basic cardiac anatomy and physiology, with the coronary electrical and arterial systems. Review of: AMI, Angina, Valve disease, Heart Failure, Cardiac Arrhythmia, Pacemakers, and the ICD.      Cardiac Rehab from 02/11/2015 in [facility] Cardiac Rehab   Date  01/28/15   Educator  S. Bice, RN   Instruction Review Code  2- meets goals/outcomes      Cardiac Procedures: - Group  verbal and written instruction and models to describe the testing methods done to diagnose heart disease. Reviews the outcomes of the test results. Describes the treatment choices: Medical Management, Angioplasty, or Coronary Bypass Surgery.   Cardiac Medications: - Group verbal and written instruction to review commonly prescribed medications for heart disease. Reviews the medication, class of the drug, and side effects. Includes the steps to properly store meds and maintain the prescription regimen.   Go Sex-Intimacy & Heart Disease, Get SMART - Goal Setting: - Group verbal and written instruction through game format to discuss heart disease and the return to sexual intimacy. Provides group verbal and written material to discuss and apply goal setting through the application of the S.M.A.R.T. Method.   Other Matters of the Heart: - Provides group verbal, written materials and models to describe Heart Failure, Angina, Valve Disease, and Diabetes in the realm of heart disease. Includes description of the disease process and treatment options available to the cardiac patient.      Cardiac Rehab from 02/11/2015 in [facility] Cardiac Rehab   Date  02/06/15   Educator  DW   Instruction Review Code  2- meets goals/outcomes      Exercise & Equipment Safety: - Individual verbal instruction and demonstration of equipment use and safety with use of the equipment.      Cardiac Rehab from 02/11/2015 in [facility] Cardiac Rehab   Date  01/21/15   Educator  C. Olufemi Mofield,.RN   Instruction Review Code  2- meets goals/outcomes      Infection Prevention: - Provides verbal and written material to individual with discussion of infection control including proper hand washing and proper equipment cleaning during exercise session.      Cardiac Rehab from 02/11/2015 in [facility] Cardiac Rehab   Date  01/21/15   Educator  C. Delainey Winstanley,RN   Instruction Review Code  2- meets goals/outcomes      Falls Prevention: - Provides  verbal and written material to individual with discussion of falls prevention and safety.      Cardiac Rehab from 02/11/2015 in [facility] Cardiac Rehab   Date  01/21/15   Educator  C. EnterkinRN   Instruction Review Code  2- meets goals/outcomes      Diabetes: - Individual verbal and written instruction to review signs/symptoms of diabetes,  desired ranges of glucose level fasting, after meals and with exercise. Advice that pre and post exercise glucose checks will be done for 3 sessions at entry of program.    Knowledge Questionnaire Score:     Knowledge Questionnaire Score - 01/21/15 1452    Knowledge Questionnaire Score   Pre Score 26      Personal Goals and Risk Factors at Admission:   Personal Goals and Risk Factors Review:      Goals and Risk Factor Review      01/30/15 1606 01/30/15 1628         Weight Management   Goals Progress/Improvement seen Yes       Comments Patient's initial weight was 184.6.  Past three sessions weight down to 183.3.  Today patient has a thick sweat shirt and weight is 185.  Patient is walking his little dogs at home several times a day.  Patient still wants to lose a few pounds.  He plans to play golf on the day after Thanksgiving.         Increase Aerobic Exercise and Physical Activity   Goals Progress/Improvement seen  Yes       Comments Patient states he feels the biggest difference when he goes to bed at night.  He states he is not as short of breath at night.         Take Less Medication   Goals Progress/Improvement seen Met       Comments  MD discontinued Digoxin and patient stopped taking Digoxin on November 22nd, 2016.         Hypertension   Progress seen toward goals  Yes      Comments  BP is well controlled.  BP 110 - 124 / 60 - 70.        Abnormal Lipids   Progress seen towards goals  Unknown      Comments  no new labs since starting Cardiac Rehab.           Personal Goals Discharge (Final Personal Goals and Risk Factors  Review):      Goals and Risk Factor Review - 01/30/15 1628    Hypertension   Progress seen toward goals Yes   Comments BP is well controlled.  BP 110 - 124 / 60 - 70.     Abnormal Lipids   Progress seen towards goals Unknown   Comments no new labs since starting Cardiac Rehab.        ITP Comments:     ITP Comments      02/12/15 1310           ITP Comments 30 day review preparation  Continue with ITP          Comments: 30 day Review today. "[name]" is doing well in Cardiac Rehab esp exercising on the treadmill.

## 2015-02-13 NOTE — Progress Notes (Signed)
Daily Session Note  Patient Details  Name: Darius Miller MRN: [id number] Date of Birth: 05-11-1945 Referring Provider:  Yolonda Kida, MD  Encounter Date: 02/13/2015  Check In:     Session Check In - 02/13/15 1611    Check-In   Staff Present Gerlene Burdock, RN, Drusilla Kanner, MS, ACSM CEP, Exercise Physiologist;Fadil Macmaster Joya Gaskins, RN, BSN   ER physicians immediately available to respond to emergencies See telemetry face sheet for immediately available ER MD   Medication changes reported     No   Fall or balance concerns reported    No   Warm-up and Cool-down Performed on first and last piece of equipment   VAD Patient? No   Pain Assessment   Currently in Pain? No/denies   Pain Score 0-No pain           Exercise Prescription Changes - 02/13/15 1600    Exercise Review   Progression Yes   Response to Exercise   Symptoms None   Comments Reviewed individualized exercise prescription and made increases per departmental policy. Exercise increases were discussed with the patient and they were able to perform the new work loads without issue (no signs or symptoms).   Duration Progress to 50 minutes of aerobic without signs/symptoms of physical distress   Intensity Rest + 30  Rest +20   Progression Continue progressive overload as per policy without signs/symptoms or physical distress.   Resistance Training   Training Prescription Yes   Weight 2   Reps 10-15   Interval Training   Interval Training Yes   Equipment Treadmill   Comments Start interval training 2.5 to 3 mph using 1 minute burst.     Treadmill   MPH 2.5   Grade 0   Minutes 15   REL-XR   Level 4   Watts 65   Minutes 20      Goals Met:  Exercise tolerated well Personal goals reviewed No report of cardiac concerns or symptoms Strength training completed today  Goals Unmet:  Not Applicable  Goals Comments: Patient started interval training on treadmill today with speed of 2.5 - 3 mph using 1  minute burst. Exercise increases were discussed with the patient and they were able to perform the new work loads without issue (no signs or symptoms).   Dr. Emily Filbert is Medical Director for [facility].

## 2015-02-18 DIAGNOSIS — Z9889 Other specified postprocedural states: Secondary | ICD-10-CM | POA: Diagnosis not present

## 2015-02-18 NOTE — Progress Notes (Signed)
Daily Session Note  Patient Details  Name: Darius Miller MRN: [id number] Date of Birth: 1945-11-24 Referring Provider:  Yolonda Kida, MD  Encounter Date: 02/18/2015  Check In:     Session Check In - 02/18/15 1621    Check-In   Staff Present Heath Lark, RN, BSN, CCRP;Carroll Enterkin, RN, BSN;Estellar Cadena, BS, ACSM EP-C, Exercise Physiologist   ER physicians immediately available to respond to emergencies See telemetry face sheet for immediately available ER MD   Medication changes reported     No   Fall or balance concerns reported    No   Warm-up and Cool-down Performed on first and last piece of equipment   VAD Patient? No   Pain Assessment   Currently in Pain? No/denies         Goals Met:  Proper associated with RPD/PD & O2 Sat Exercise tolerated well No report of cardiac concerns or symptoms Strength training completed today  Goals Unmet:  Not Applicable  Goals Comments:    Dr. Emily Filbert is Medical Director for [facility].

## 2015-02-20 ENCOUNTER — Encounter: Payer: BLUE CROSS/BLUE SHIELD | Admitting: *Deleted

## 2015-02-20 DIAGNOSIS — Z9889 Other specified postprocedural states: Secondary | ICD-10-CM

## 2015-02-20 NOTE — Progress Notes (Signed)
Daily Session Note  Patient Details  Name: Darius Miller MRN: [id number] Date of Birth: 02/04/1946 Referring Provider:  Yolonda Kida, MD  Encounter Date: 02/20/2015  Check In:     Session Check In - 02/20/15 1608    Check-In   Staff Present Candiss Norse, MS, ACSM CEP, Exercise Physiologist;Susanne Bice, RN, BSN, CCRP;Carroll Enterkin, RN, BSN   ER physicians immediately available to respond to emergencies See telemetry face sheet for immediately available ER MD   Medication changes reported     No   Fall or balance concerns reported    No   Warm-up and Cool-down Performed on first and last piece of equipment   Pain Assessment   Currently in Pain? No/denies         Goals Met:  Exercise tolerated well No report of cardiac concerns or symptoms Strength training completed today  Goals Unmet:  Not Applicable  Goals Comments: Doing well with exercise progression   Dr. Emily Filbert is Medical Director for [place] and [facility].

## 2015-02-21 DIAGNOSIS — Z9889 Other specified postprocedural states: Secondary | ICD-10-CM | POA: Diagnosis not present

## 2015-02-21 NOTE — Progress Notes (Signed)
Daily Session Note  Patient Details  Name: Darius Miller MRN: [id number] Date of Birth: [birth year] Referring Provider:  Yolonda Kida, MD  Encounter Date: 02/21/2015  Check In:     Session Check In - 02/21/15 1649    Check-In   Staff Present Nyoka Cowden, RN;Margorie Renner, RN, BSN;Steven Way, BS, ACSM EP-C, Exercise Physiologist   ER physicians immediately available to respond to emergencies See telemetry face sheet for immediately available ER MD   Medication changes reported     No   Fall or balance concerns reported    No   Warm-up and Cool-down Performed on first and last piece of equipment   VAD Patient? No   Pain Assessment   Currently in Pain? Yes   Pain Score 5    Pain Location Back   Pain Orientation Right   Pain Descriptors / Indicators Constant   Pain Onset More than a month ago   Pain Frequency Occasional   Aggravating Factors  Walking or sitting too long   Multiple Pain Sites Yes   2nd Pain Site   Pain Score 5   Pain Location Knee   Pain Orientation Left   Pain Descriptors / Indicators Aching   Pain Onset More than a month ago   Pain Frequency Occasional   Aggravating Factors  Too much walking at times.         Goals Met:  Proper associated with RPD/PD & O2 Sat Exercise tolerated well Personal goals reviewed  Goals Unmet:  Not Applicable  Goals Comments: " I know what to eat. I don't need to meet individually with the registered dietician [name] reports.    Dr. Emily Filbert is Medical Director for [facility].

## 2015-02-25 DIAGNOSIS — Z9889 Other specified postprocedural states: Secondary | ICD-10-CM | POA: Diagnosis not present

## 2015-02-25 NOTE — Progress Notes (Signed)
Daily Session Note  Patient Details  Name: Vega W. Yepez MRN: [id number] Date of Birth: 04/03/1945 Referring Provider:  Callwood, Dwayne D, MD  Encounter Date: 02/25/2015  Check In:     Session Check In - 02/25/15 1618    Check-In   Staff Present Susanne Bice, RN, BSN, CCRP;Carroll Enterkin, RN, BSN; , BS, ACSM EP-C, Exercise Physiologist   ER physicians immediately available to respond to emergencies See telemetry face sheet for immediately available ER MD   Medication changes reported     No   Fall or balance concerns reported    No   Warm-up and Cool-down Performed on first and last piece of equipment   VAD Patient? No   Pain Assessment   Currently in Pain? No/denies         Goals Met:  Proper associated with RPD/PD & O2 Sat Exercise tolerated well No report of cardiac concerns or symptoms Strength training completed today  Goals Unmet:  Not Applicable  Goals Comments:    Dr. Mark Miller is Medical Director for [facility] and [facility]. 

## 2015-02-27 ENCOUNTER — Encounter: Payer: BLUE CROSS/BLUE SHIELD | Admitting: *Deleted

## 2015-02-27 DIAGNOSIS — Z9889 Other specified postprocedural states: Secondary | ICD-10-CM | POA: Diagnosis not present

## 2015-02-27 NOTE — Progress Notes (Signed)
Daily Session Note  Patient Details  Name: Jessiah Steinhart MRN: [id number] Date of Birth: 11-24-1945 Referring Provider:  Yolonda Kida, MD  Encounter Date: 02/27/2015  Check In:     Session Check In - 02/27/15 1727    Check-In   Staff Present Nyoka Cowden, RN;Jahna Liebert Dillard Essex, MS, ACSM CEP, Exercise Physiologist;Diane Joya Gaskins, RN, BSN   ER physicians immediately available to respond to emergencies See telemetry face sheet for immediately available ER MD   Medication changes reported     No   Fall or balance concerns reported    No   Warm-up and Cool-down Performed on first and last piece of equipment   VAD Patient? No   Pain Assessment   Currently in Pain? No/denies   Multiple Pain Sites No         Goals Met:  Independence with exercise equipment Personal goals reviewed No report of cardiac concerns or symptoms Strength training completed today  Goals Unmet:  Not Applicable  Goals Comments: Patient completed exercise prescription and all exercise goals during rehab session. The exercise was tolerated well and the patient is progressing in the program.    Dr. Emily Filbert is Medical Director for [place] and [facility].

## 2015-03-06 ENCOUNTER — Encounter: Payer: BLUE CROSS/BLUE SHIELD | Admitting: *Deleted

## 2015-03-06 DIAGNOSIS — Z9889 Other specified postprocedural states: Secondary | ICD-10-CM

## 2015-03-06 NOTE — Progress Notes (Signed)
Daily Session Note  Patient Details  Name: Darius Miller MRN: [id number] Date of Birth: [birth year] Referring Provider:  Yolonda Kida, MD  Encounter Date: 03/06/2015  Check In:     Session Check In - 03/06/15 1611    Check-In   Staff Present Gerlene Burdock, RN, Drusilla Kanner, MS, ACSM CEP, Exercise Physiologist;Diane Joya Gaskins, RN, BSN   ER physicians immediately available to respond to emergencies See telemetry face sheet for immediately available ER MD   Medication changes reported     No   Fall or balance concerns reported    No   Warm-up and Cool-down Performed on first and last piece of equipment   VAD Patient? No   Pain Assessment   Currently in Pain? No/denies   Multiple Pain Sites No         Goals Met:  Independence with exercise equipment Exercise tolerated well No report of cardiac concerns or symptoms Strength training completed today  Goals Unmet:  Not Applicable  Goals Comments: Patient completed exercise prescription and all exercise goals during rehab session. The exercise was tolerated well and the patient is progressing in the program.    Dr. Emily Filbert is Medical Director for [place] and [facility].

## 2015-03-10 NOTE — Progress Notes (Signed)
Cardiac Individual Treatment Plan  Patient Details  Name: Darius Miller MRN: [id number] Date of Birth: October 04, 1945 Referring Provider:  Yolonda Kida, MD  Initial Encounter Date:    Visit Diagnosis: S/P mitral valve repair  Patient's Home Medications on Admission:  Current outpatient prescriptions:  .  aspirin 81 MG tablet, Take 81 mg by mouth daily., Disp: , Rfl:  .  atorvastatin (LIPITOR) 40 MG tablet, Take 40 mg by mouth daily., Disp: , Rfl:  .  dabigatran (PRADAXA) 150 MG CAPS capsule, Take 150 mg by mouth 2 (two) times daily., Disp: , Rfl:  .  digoxin (LANOXIN) 0.125 MG tablet, Take 0.25 mg by mouth daily., Disp: , Rfl:  .  furosemide (LASIX) 40 MG tablet, Take 40 mg by mouth., Disp: , Rfl:  .  lisinopril (PRINIVIL,ZESTRIL) 2.5 MG tablet, Take 2.5 mg by mouth daily., Disp: , Rfl:  .  metoprolol succinate (TOPROL-XL) 25 MG 24 hr tablet, Take 12.5 mg by mouth daily., Disp: , Rfl:   Past Medical History: Past Medical History  Diagnosis Date  . Coronary artery disease     Tobacco Use: History  Smoking status  . Former Smoker -- 2.00 packs/day for 20 years  . Types: Cigarettes  Smokeless tobacco  . Not on file    Labs: Recent Review Flowsheet Data    There is no flowsheet data to display.       Exercise Target Goals:    Exercise Program Goal: Individual exercise prescription set with THRR, safety & activity barriers. Participant demonstrates ability to understand and report RPE using BORG scale, to self-measure pulse accurately, and to acknowledge the importance of the exercise prescription.  Exercise Prescription Goal: Starting with aerobic activity 30 plus minutes a day, 3 days per week for initial exercise prescription. Provide home exercise prescription and guidelines that participant acknowledges understanding prior to discharge.  Activity Barriers & Risk Stratification:     Activity Barriers & Risk Stratification - 01/21/15 1454    Activity Barriers  & Risk Stratification   Activity Barriers Arthritis   Risk Stratification High      6 Minute Walk:     6 Minute Walk      01/21/15 1342       6 Minute Walk   Phase Initial     Distance 1185 feet     Walk Time 6 minutes     Resting HR 43 bpm     Resting BP 110/60 mmHg     Max Ex. HR 84 bpm     Max Ex. BP 114/62 mmHg     RPE 11     Symptoms No        Initial Exercise Prescription:     Initial Exercise Prescription - 01/21/15 1300    Date of Initial Exercise Prescription   Date 01/21/15   Treadmill   MPH 2   Grade 0   Minutes 10   Bike   Level 0.4   Watts 15   Minutes 10   Recumbant Bike   Level 3   RPM 40   Watts 25   Minutes 15   NuStep   Level 3   Watts 25   Minutes 15   Arm Ergometer   Level 1   Watts 8   Minutes 10   Arm/Foot Ergometer   Level 4   Watts 15   Minutes 10   Cybex   Level 2   RPM 50   Minutes 10  Recumbant Elliptical   Level 1   RPM 40   Watts 10   Minutes 10   Elliptical   Level 1   Speed 3   Minutes 1   REL-XR   Level 2   Watts 35   Minutes 10   Prescription Details   Frequency (times per week) 3   Duration Progress to 30 minutes of continuous aerobic without signs/symptoms of physical distress   Intensity   THRR REST +  20   Ratings of Perceived Exertion 11-15   Progression Continue progressive overload as per policy without signs/symptoms or physical distress.   Resistance Training   Training Prescription Yes   Weight 2   Reps 10-15      Exercise Prescription Changes:     Exercise Prescription Changes      01/23/15 1600 02/04/15 1437 02/13/15 1600 02/21/15 1421 02/27/15 1455   Exercise Review   Progression  Yes Yes  Yes   Response to Exercise   Blood Pressure (Admit)  108/74 mmHg   104/70 mmHg   Blood Pressure (Exercise)  134/64 mmHg   108/68 mmHg   Blood Pressure (Exit)  110/70 mmHg   100/60 mmHg   Heart Rate (Admit)  86 bpm   93 bpm   Heart Rate (Exercise)  105 bpm   125 bpm   Heart Rate (Exit)   88 bpm   88 bpm   Rating of Perceived Exertion (Exercise)  13   12   Symptoms None None None  None   Comments First day of exercise! Patient was oriented to the gym and the equipment functions and settings. Procedures and policies of the gym were outlined and explained. The patient's individual exercise prescription and treatment plan were reviewed with them. All starting workloads were established based on the results of the functional testing  done at the initial intake visit. The plan for exercise progression was also introduced and progression will be customized based on the patient's performance and goals.  Reviewed individualized exercise prescription and made increases per departmental policy. Exercise increases were discussed with the patient and they were able to perform the new work loads without issue (no signs or symptoms). Reviewed individualized exercise prescription and made increases per departmental policy. Exercise increases were discussed with the patient and they were able to perform the new work loads without issue (no signs or symptoms).  Darius Miller can continuously exercise for the entire class and his exercise progression will now focus on intensity. We met with him and discussed interval training and he has been incorporating it into his exercise routine on the treadmill in order to progressively overload his system and allow for continual improvement. We met with him and requested that he add 1d/wk of home exercise into his routine and we will follow up with him on what he is going to do.    Frequency     Add 1 additional day to program exercise sessions.   Duration Progress to 30 minutes of continuous aerobic without signs/symptoms of physical distress Progress to 50 minutes of aerobic without signs/symptoms of physical distress Progress to 50 minutes of aerobic without signs/symptoms of physical distress  Progress to 50 minutes of aerobic without signs/symptoms of physical distress    Intensity Other (comment)  Rest +20 Rest + 30  Rest +20 Rest + 30  Rest +20  Rest + 30   Progression Continue progressive overload as per policy without signs/symptoms or physical distress. Continue progressive overload as per  policy without signs/symptoms or physical distress. Continue progressive overload as per policy without signs/symptoms or physical distress.  Continue progressive overload as per policy without signs/symptoms or physical distress.   Resistance Training   Training Prescription Yes Yes Yes  Yes   Weight _0 Reps 10-15 10-15 10-15  10-15   Interval Training   Interval Training No No Yes  Yes   Equipment   Treadmill  Treadmill   Comments   Start interval training 2.5 to 3 mph using 1 minute burst.    2.5 to 3 mph using 1 minute burst.     Treadmill   MPH  2.5 2.5 2.5 3   Grade  0 0 0 0   Minutes  _1 REL-XR   Level  _2 Watts  65 65 65 65   Minutes  _3 Biostep-RELP   Level     4   Watts     25   Minutes     20      Discharge Exercise Prescription (Final Exercise Prescription Changes):     Exercise Prescription Changes - 02/27/15 1455    Exercise Review   Progression Yes   Response to Exercise   Blood Pressure (Admit) 104/70 mmHg   Blood Pressure (Exercise) 108/68 mmHg   Blood Pressure (Exit) 100/60 mmHg   Heart Rate (Admit) 93 bpm   Heart Rate (Exercise) 125 bpm   Heart Rate (Exit) 88 bpm   Rating of Perceived Exertion (Exercise) 12   Symptoms None   Comments Darius Miller can continuously exercise for the entire class and his exercise progression will now focus on intensity. We met with him and discussed interval training and he has been incorporating it into his exercise routine on the treadmill in order to progressively overload his system and allow for continual improvement. We met with him and requested that he add 1d/wk of home exercise into his routine and we will follow up with him on what he is going to do.    Frequency  Add 1 additional day to program exercise sessions.   Duration Progress to 50 minutes of aerobic without signs/symptoms of physical distress   Intensity Rest + 30   Progression Continue progressive overload as per policy without signs/symptoms or physical distress.   Resistance Training   Training Prescription Yes   Weight 3   Reps 10-15   Interval Training   Interval Training Yes   Equipment Treadmill   Comments 2.5 to 3 mph using 1 minute burst.     Treadmill   MPH 3   Grade 0   Minutes 15   REL-XR   Level 4   Watts 65   Minutes 20   Biostep-RELP   Level 4   Watts 25   Minutes 20      Nutrition:  Target Goals: Understanding of nutrition guidelines, daily intake of sodium <1558m, cholesterol <2025m calories 30% from fat and 7% or less from saturated fats, daily to have 5 or more servings of fruits and vegetables.  Biometrics:     Pre Biometrics - 01/21/15 1335    Pre Biometrics   Height 5' 9.4" (1.763 m)   Weight 184 lb 9.6 oz (83.734 kg)   Waist Circumference 39.75 inches   Hip Circumference 40 inches   Waist to Hip Ratio 0.99 %   BMI (Calculated) 27  Nutrition Therapy Plan and Nutrition Goals:     Nutrition Therapy & Goals - 01/21/15 1458    Nutrition Therapy   Drug/Food Interactions --  Macgregor prefers not to meet individually with the Cardiac Rehab registered dietician.       Nutrition Discharge: Rate Your Plate Scores:   Nutrition Goals Re-Evaluation:     Nutrition Goals Re-Evaluation      02/21/15 1645           Personal Goal #1 Re-Evaluation   Personal Goal #1 Darius Miller prefers not to meet individually with the registered dietician. "I know what I need to eat".           Psychosocial: Target Goals: Acknowledge presence or absence of depression, maximize coping skills, provide positive support system. Participant is able to verbalize types and ability to use techniques and skills needed for reducing stress and depression.  Initial  Review & Psychosocial Screening:     Initial Psych Review & Screening - 01/21/15 1458    Initial Review   Current issues with Current Stress Concerns   Source of Stress Concerns Retirement/disability   Comments Sumeet reported he was giving his wife info to relay when he was in the hospital since they were selling their house in [place] to move closer to [place] and where he grew up in [place], [place]. He had to sell his mother's dining room set since moved from a 3 bedroom home to a 2 bedroom condo since he didn't feel he could keep up with the yeardwork etc. 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    Family Dynamics   Good Support System? Yes   Barriers   Psychosocial barriers to participate in program The patient should benefit from training in stress management and relaxation.   Screening Interventions   Interventions Encouraged to exercise      Quality of Life Scores:   PHQ-9:     Recent Review Flowsheet Data    Depression screen [facility] 2/9 01/21/2015   Decreased Interest 0   Down, Depressed, Hopeless 0   PHQ - 2 Score 0   Altered sleeping 0   Tired, decreased energy 3   Change in appetite 0   Feeling bad or failure about yourself  0   Trouble concentrating 0   Moving slowly or fidgety/restless 0   Suicidal thoughts 0   PHQ-9 Score 3   Difficult doing work/chores Somewhat difficult      Psychosocial Evaluation and Intervention:     Psychosocial Evaluation - 01/30/15 1639    Psychosocial Evaluation & Interventions   Interventions Stress management education;Relaxation education;Encouraged to exercise with the program and follow exercise prescription   Comments Counselor met with Mr. Belisle today for initial psychosocial evaluation.  He is a well-adjusted 70 year old who had cardiac surgery in August.  He has a strong support system with a fiance and adult children/grandchildren who live close by.  Mr. Reish reports having kidney stones  and some pulmonary issues as well as his cardiac illness.  He states that he sleeps well for the most part and has a good appetite.  He denies a history of depression or anxiety or current symptoms.  He states he is typically in a good mood and loves to be on the golf [place].  Current stress in [place] life [place]  His goals for this program are to lose weight, to increase his  stamina and strength and increase his energy level.  Counselor will follow with Mr. Vansickle as needed.    Continued Psychosocial Services Needed Yes  Mr. Flink will benefit from the psychoeducational components of this program, especially stress management and relaxation due to his recent move.  He will also benefit from meeting with the dietician for weight loss goal to be addressed.      Psychosocial Re-Evaluation:     Psychosocial Re-Evaluation      02/21/15 1645           Psychosocial Re-Evaluation   Interventions Encouraged to attend Cardiac Rehabilitation for the exercise       Comments Phuoc usually states when he attends Cardiac Rehab that he is ready to "get out of here " but he stays the whole time.          Vocational Rehabilitation: Provide vocational rehab assistance to qualifying candidates.   Vocational Rehab Evaluation & Intervention:     Vocational Rehab - 01/21/15 1405    Initial Vocational Rehab Evaluation & Intervention   Assessment shows need for Vocational Rehabilitation (p) No      Education: Education Goals: Education classes will be provided on a weekly basis, covering required topics. Participant will state understanding/return demonstration of topics presented.  Learning Barriers/Preferences:     Learning Barriers/Preferences - 01/21/15 1455    Learning Barriers/Preferences   Learning Barriers None   Learning Preferences None      Education Topics: General Nutrition Guidelines/Fats and Fiber: -Group instruction provided by  verbal, written material, models and posters to present the general guidelines for heart healthy nutrition. Gives an explanation and review of dietary fats and fiber.          Cardiac Rehab from 03/06/2015 in [facility] Cardiac and Pulmonary Rehab   Date  02/11/15   Educator  PI   Instruction Review Code  2- meets goals/outcomes      Controlling Sodium/Reading Food Labels: -Group verbal and written material supporting the discussion of sodium use in heart healthy nutrition. Review and explanation with models, verbal and written materials for utilization of the food label.      Cardiac Rehab from 03/06/2015 in [facility] Cardiac and Pulmonary Rehab   Date  02/18/15   Educator  PI   Instruction Review Code  2- meets goals/outcomes      Exercise Physiology & Risk Factors: - Group verbal and written instruction with models to review the exercise physiology of the cardiovascular system and associated critical values. Details cardiovascular disease risk factors and the goals associated with each risk factor.   Aerobic Exercise & Resistance Training: - Gives group verbal and written discussion on the health impact of inactivity. On the components of aerobic and resistive training programs and the benefits of this training and how to safely progress through these programs.   Flexibility, Balance, General Exercise Guidelines: - Provides group verbal and written instruction on the benefits of flexibility and balance training programs. Provides general exercise guidelines with specific guidelines to those with heart or lung disease. Demonstration and skill practice provided.   Stress Management: - Provides group verbal and written instruction about the health risks of elevated stress, cause of high stress, and healthy ways to reduce stress.      Cardiac Rehab from 03/06/2015 in [facility] Cardiac and Pulmonary Rehab   Date  01/23/15   Educator  Kathreen Cornfield   Instruction Review Code  2- meets goals/outcomes       Depression: - Provides  group verbal and written instruction on the correlation between heart/lung disease and depressed mood, treatment options, and the stigmas associated with seeking treatment.      Cardiac Rehab from 03/06/2015 in [facility] Cardiac and Pulmonary Rehab   Date  02/20/15   Educator  Juliann Pulse C   Instruction Review Code  2- meets goals/outcomes      Anatomy & Physiology of the Heart: - Group verbal and written instruction and models provide basic cardiac anatomy and physiology, with the coronary electrical and arterial systems. Review of: AMI, Angina, Valve disease, Heart Failure, Cardiac Arrhythmia, Pacemakers, and the ICD.      Cardiac Rehab from 03/06/2015 in [facility] Cardiac and Pulmonary Rehab   Date  01/28/15   Educator  S. Manfred Laspina, RN   Instruction Review Code  2- meets goals/outcomes      Cardiac Procedures: - Group verbal and written instruction and models to describe the testing methods done to diagnose heart disease. Reviews the outcomes of the test results. Describes the treatment choices: Medical Management, Angioplasty, or Coronary Bypass Surgery.      Cardiac Rehab from 03/06/2015 in [facility] Cardiac and Pulmonary Rehab   Date  02/25/15   Educator  SB   Instruction Review Code  2- meets goals/outcomes      Cardiac Medications: - Group verbal and written instruction to review commonly prescribed medications for heart disease. Reviews the medication, class of the drug, and side effects. Includes the steps to properly store meds and maintain the prescription regimen.      Cardiac Rehab from 03/06/2015 in [facility] Cardiac and Pulmonary Rehab   Date  03/06/15   Educator  DW   Instruction Review Code  2- meets goals/outcomes      Go Sex-Intimacy & Heart Disease, Get SMART - Goal Setting: - Group verbal and written instruction through game format to discuss heart disease and the return to sexual intimacy. Provides group verbal and written material to discuss and apply goal  setting through the application of the S.M.A.R.T. Method.      Cardiac Rehab from 03/06/2015 in [facility] Cardiac and Pulmonary Rehab   Date  02/25/15   Educator  SB   Instruction Review Code  2- meets goals/outcomes      Other Matters of the Heart: - Provides group verbal, written materials and models to describe Heart Failure, Angina, Valve Disease, and Diabetes in the realm of heart disease. Includes description of the disease process and treatment options available to the cardiac patient.      Cardiac Rehab from 03/06/2015 in [facility] Cardiac and Pulmonary Rehab   Date  02/06/15   Educator  DW   Instruction Review Code  2- meets goals/outcomes      Exercise & Equipment Safety: - Individual verbal instruction and demonstration of equipment use and safety with use of the equipment.      Cardiac Rehab from 03/06/2015 in [facility] Cardiac and Pulmonary Rehab   Date  01/21/15   Educator  C. Enterkin,.RN   Instruction Review Code  2- meets goals/outcomes      Infection Prevention: - Provides verbal and written material to individual with discussion of infection control including proper hand washing and proper equipment cleaning during exercise session.      Cardiac Rehab from 03/06/2015 in [facility] Cardiac and Pulmonary Rehab   Date  01/21/15   Educator  C. Enterkin,RN   Instruction Review Code  2- meets goals/outcomes      Falls Prevention: - Provides verbal and  written material to individual with discussion of falls prevention and safety.      Cardiac Rehab from 03/06/2015 in [facility] Cardiac and Pulmonary Rehab   Date  01/21/15   Educator  C. Caro   Instruction Review Code  2- meets goals/outcomes      Diabetes: - Individual verbal and written instruction to review signs/symptoms of diabetes, desired ranges of glucose level fasting, after meals and with exercise. Advice that pre and post exercise glucose checks will be done for 3 sessions at entry of program.    Knowledge  Questionnaire Score:     Knowledge Questionnaire Score - 01/21/15 1452    Knowledge Questionnaire Score   Pre Score 26      Personal Goals and Risk Factors at Admission:   Personal Goals and Risk Factors Review:      Goals and Risk Factor Review      01/30/15 1606 01/30/15 1628 02/21/15 1642       Weight Management   Goals Progress/Improvement seen Yes  No     Comments Patient's initial weight was 184.6.  Past three sessions weight down to 183.3.  Today patient has a thick sweat shirt and weight is 185.  Patient is walking his little dogs at home several times a day.  Patient still wants to lose a few pounds.  He plans to play golf on the day after Thanksgiving.         Increase Aerobic Exercise and Physical Activity   Goals Progress/Improvement seen  Yes  Yes     Comments Patient states he feels the biggest difference when he goes to bed at night.  He states he is not as short of breath at night.    Thanos left knee hurts 5/10 and right hip is 5/10 on Biostep after using the treadmill today.     Take Less Medication   Goals Progress/Improvement seen Met  Yes     Comments  MD discontinued Digoxin and patient stopped taking Digoxin on November 22nd, 2016.    MD cut his one pill in 1/4. blood pressure today was 108/68.     Hypertension   Progress seen toward goals  Yes Yes     Comments  BP is well controlled.  BP 110 - 124 / 60 - 70.   108/68.      Abnormal Lipids   Progress seen towards goals  Unknown Unknown     Comments  no new labs since starting Cardiac Rehab.           Personal Goals Discharge (Final Personal Goals and Risk Factors Review):      Goals and Risk Factor Review - 02/21/15 1642    Weight Management   Goals Progress/Improvement seen No   Increase Aerobic Exercise and Physical Activity   Goals Progress/Improvement seen  Yes   Comments [place]oc left knee hurts 5/10 and right hip is 5/10 on Biostep after using the treadmill today.   Take Less Medication   Goals  Progress/Improvement seen Yes   Comments MD cut his one pill in 1/4. blood pressure today was 108/68.   Hypertension   Progress seen toward goals Yes   Comments 108/68.    Abnormal Lipids   Progress seen towards goals Unknown      ITP Comments:     ITP Comments      02/12/15 1310 03/10/15 1422         ITP Comments 30 day review preparation  Continue with ITP Ready  for 30 day review.  Continue with ITP         Comments:

## 2015-03-13 ENCOUNTER — Encounter: Payer: BLUE CROSS/BLUE SHIELD | Attending: Internal Medicine | Admitting: *Deleted

## 2015-03-13 DIAGNOSIS — Z9889 Other specified postprocedural states: Secondary | ICD-10-CM | POA: Insufficient documentation

## 2015-03-13 NOTE — Progress Notes (Signed)
Daily Session Note  Patient Details  Name: Darius Miller MRN: [id number] Date of Birth: 06-11-1945 Referring Provider:  Yolonda Kida, MD  Encounter Date: 03/13/2015  Check In:     Session Check In - 03/13/15 1615    Check-In   Staff Present Gerlene Burdock, RN, Drusilla Kanner, MS, ACSM CEP, Exercise Physiologist;Michaelina Blandino Joya Gaskins, RN, BSN   ER physicians immediately available to respond to emergencies See telemetry face sheet for immediately available ER MD   Medication changes reported     No   Fall or balance concerns reported    No   Warm-up and Cool-down Performed on first and last piece of equipment   VAD Patient? No   Pain Assessment   Currently in Pain? No/denies         Goals Met:  Independence with exercise equipment Exercise tolerated well No report of cardiac concerns or symptoms Strength training completed today  Goals Unmet:  Not Applicable  Goals Comments: Patient completed exercise prescription and all exercise goals during rehab session. The exercise was tolerated well and the patient is progressing in the program.    Dr. Emily Filbert is Medical Director for [place] and [facility].

## 2015-03-13 NOTE — Addendum Note (Signed)
Addended by: Rudy Jew on: 03/13/2015 11:44 AM   Modules accepted: Orders

## 2015-03-14 DIAGNOSIS — Z9889 Other specified postprocedural states: Secondary | ICD-10-CM

## 2015-03-14 NOTE — Progress Notes (Signed)
Daily Session Note  Patient Details  Name: Loc Feinstein MRN: [id number] Date of Birth: [birth year] Referring Provider:  Yolonda Kida, MD  Encounter Date: 03/14/2015  Check In:     Session Check In - 03/14/15 1616    Check-In   Staff Present Gerlene Burdock, RN, BSN;Steven Way, BS, ACSM EP-C, Exercise Physiologist;Diane Joya Gaskins, RN, BSN   ER physicians immediately available to respond to emergencies See telemetry face sheet for immediately available ER MD   Medication changes reported     No   Fall or balance concerns reported    No   Warm-up and Cool-down Performed on first and last piece of equipment   Pain Assessment   Currently in Pain? No/denies         Goals Met:  Proper associated with RPD/PD & O2 Sat Exercise tolerated well Personal goals reviewed  Goals Unmet:  Not Applicable  Goals Comments:    Dr. Emily Filbert is Medical Director for [place] and [facility].

## 2015-03-20 ENCOUNTER — Encounter: Payer: BLUE CROSS/BLUE SHIELD | Admitting: *Deleted

## 2015-03-20 DIAGNOSIS — Z9889 Other specified postprocedural states: Secondary | ICD-10-CM

## 2015-03-20 NOTE — Progress Notes (Signed)
Daily Session Note  Patient Details  Name: Darius Miller MRN: [id number] Date of Birth: 24-Oct-1945 Referring Provider:  Yolonda Kida, MD  Encounter Date: 03/20/2015  Check In:     Session Check In - 03/20/15 1626    Check-In   Staff Present Gerlene Burdock, RN, Drusilla Kanner, MS, ACSM CEP, Exercise Physiologist;Jaidee Stipe Joya Gaskins, RN, BSN   ER physicians immediately available to respond to emergencies See telemetry face sheet for immediately available ER MD   Medication changes reported     No   Fall or balance concerns reported    No   Warm-up and Cool-down Performed on first and last piece of equipment   VAD Patient? No   Pain Assessment   Currently in Pain? No/denies         Goals Met:  Independence with exercise equipment Exercise tolerated well No report of cardiac concerns or symptoms Strength training completed today  Goals Unmet:  Not Applicable  Goals Comments: Patient completed exercise prescription and all exercise goals during rehab session. The exercise was tolerated well and the patient is progressing in the program.    Dr. Emily Filbert is Medical Director for [facility].

## 2015-03-21 ENCOUNTER — Encounter: Payer: BLUE CROSS/BLUE SHIELD | Admitting: *Deleted

## 2015-03-21 DIAGNOSIS — Z9889 Other specified postprocedural states: Secondary | ICD-10-CM | POA: Diagnosis not present

## 2015-03-21 NOTE — Progress Notes (Signed)
Daily Session Note  Patient Details  Name: Issai Werling MRN: [id number] Date of Birth: [birth year] Referring Provider:  Yolonda Kida, MD  Encounter Date: 03/21/2015  Check In:     Session Check In - 03/21/15 1649    Check-In   Staff Present Gerlene Burdock, RN, BSN;Diane Joya Gaskins, RN, BSN;Steven Way, BS, ACSM EP-C, Exercise Physiologist   ER physicians immediately available to respond to emergencies See telemetry face sheet for immediately available ER MD   Medication changes reported     No   Fall or balance concerns reported    No   Warm-up and Cool-down Performed on first and last piece of equipment   VAD Patient? No   Pain Assessment   Currently in Pain? No/denies         Goals Met:  Proper associated with RPD/PD & O2 Sat Exercise tolerated well  Goals Unmet:  Not Applicable  Goals Comments:    Dr. Emily Filbert is Medical Director for [facility].

## 2015-03-25 DIAGNOSIS — Z9889 Other specified postprocedural states: Secondary | ICD-10-CM

## 2015-03-25 NOTE — Progress Notes (Signed)
Daily Session Note  Patient Details  Name: [name] MRN: [id number] Date of Birth: May 27, 1945 Referring Provider:  Yolonda Kida, MD  Encounter Date: 03/25/2015  Check In:     Session Check In - 03/25/15 1616    Check-In   Staff Present Heath Lark, RN, BSN, CCRP;Renee Dillard [place], MS, ACSM CEP, Exercise Physiologist;Iisha Soyars, BS, ACSM EP-C, Exercise Physiologist   ER physicians immediately available to respond to emergencies See telemetry face sheet for immediately available ER MD   Medication changes reported     No   Fall or balance concerns reported    No   Warm-up and Cool-down Performed on first and last piece of equipment   VAD Patient? No   Pain Assessment   Currently in Pain? No/denies         Goals Met:  Proper associated with RPD/PD & O2 Sat Exercise tolerated well No report of cardiac concerns or symptoms Strength training completed today  Goals Unmet:  Not Applicable  Goals Comments:   Dr. Emily Filbert is Medical Director for [facility].

## 2015-03-27 ENCOUNTER — Encounter: Payer: BLUE CROSS/BLUE SHIELD | Admitting: *Deleted

## 2015-03-27 DIAGNOSIS — Z9889 Other specified postprocedural states: Secondary | ICD-10-CM | POA: Diagnosis not present

## 2015-03-27 NOTE — Progress Notes (Signed)
Cardiac Individual Treatment Plan  Patient Details  Name: Darius Miller MRN: [id number] Date of Birth: Jun 09, 1945 Referring Provider:  Yolonda Kida, MD  Initial Encounter Date:    Visit Diagnosis: S/P mitral valve repair  Patient's Home Medications on Admission:  Current outpatient prescriptions:  .  aspirin 81 MG tablet, Take 81 mg by mouth daily., Disp: , Rfl:  .  atorvastatin (LIPITOR) 40 MG tablet, Take 40 mg by mouth daily., Disp: , Rfl:  .  dabigatran (PRADAXA) 150 MG CAPS capsule, Take 150 mg by mouth 2 (two) times daily., Disp: , Rfl:  .  digoxin (LANOXIN) 0.125 MG tablet, Take 0.25 mg by mouth daily., Disp: , Rfl:  .  furosemide (LASIX) 40 MG tablet, Take 40 mg by mouth., Disp: , Rfl:  .  lisinopril (PRINIVIL,ZESTRIL) 2.5 MG tablet, Take 2.5 mg by mouth daily., Disp: , Rfl:  .  metoprolol succinate (TOPROL-XL) 25 MG 24 hr tablet, Take 12.5 mg by mouth daily., Disp: , Rfl:   Past Medical History: Past Medical History  Diagnosis Date  . Coronary artery disease     Tobacco Use: History  Smoking status  . Former Smoker -- 2.00 packs/day for 20 years  . Types: Cigarettes  Smokeless tobacco  . Not on file    Labs: Recent Review Flowsheet Data    There is no flowsheet data to display.       Exercise Target Goals:    Exercise Program Goal: Individual exercise prescription set with THRR, safety & activity barriers. Participant demonstrates ability to understand and report RPE using BORG scale, to self-measure pulse accurately, and to acknowledge the importance of the exercise prescription.  Exercise Prescription Goal: Starting with aerobic activity 30 plus minutes a day, 3 days per week for initial exercise prescription. Provide home exercise prescription and guidelines that participant acknowledges understanding prior to discharge.  Activity Barriers & Risk Stratification:     Activity Barriers & Risk Stratification - 01/21/15 1454    Activity Barriers  & Risk Stratification   Activity Barriers Arthritis   Risk Stratification High      6 Minute Walk:     6 Minute Walk      01/21/15 1342       6 Minute Walk   Phase Initial     Distance 1185 feet     Walk Time 6 minutes     Resting HR 43 bpm     Resting BP 110/60 mmHg     Max Ex. HR 84 bpm     Max Ex. BP 114/62 mmHg     RPE 11     Symptoms No        Initial Exercise Prescription:     Initial Exercise Prescription - 01/21/15 1300    Date of Initial Exercise Prescription   Date 01/21/15   Treadmill   MPH 2   Grade 0   Minutes 10   Bike   Level 0.4   Watts 15   Minutes 10   Recumbant Bike   Level 3   RPM 40   Watts 25   Minutes 15   NuStep   Level 3   Watts 25   Minutes 15   Arm Ergometer   Level 1   Watts 8   Minutes 10   Arm/Foot Ergometer   Level 4   Watts 15   Minutes 10   Cybex   Level 2   RPM 50   Minutes 10  Recumbant Elliptical   Level 1   RPM 40   Watts 10   Minutes 10   Elliptical   Level 1   Speed 3   Minutes 1   REL-XR   Level 2   Watts 35   Minutes 10   Prescription Details   Frequency (times per week) 3   Duration Progress to 30 minutes of continuous aerobic without signs/symptoms of physical distress   Intensity   THRR REST +  20   Ratings of Perceived Exertion 11-15   Progression Continue progressive overload as per policy without signs/symptoms or physical distress.   Resistance Training   Training Prescription Yes   Weight 2   Reps 10-15      Exercise Prescription Changes:     Exercise Prescription Changes      01/23/15 1600 02/04/15 1437 02/13/15 1600 02/21/15 1421 02/27/15 1455   Exercise Review   Progression  Yes Yes  Yes   Response to Exercise   Blood Pressure (Admit)  108/74 mmHg   104/70 mmHg   Blood Pressure (Exercise)  134/64 mmHg   108/68 mmHg   Blood Pressure (Exit)  110/70 mmHg   100/60 mmHg   Heart Rate (Admit)  86 bpm   93 bpm   Heart Rate (Exercise)  105 bpm   125 bpm   Heart Rate (Exit)   88 bpm   88 bpm   Rating of Perceived Exertion (Exercise)  13   12   Symptoms None None None  None   Comments First day of exercise! Patient was oriented to the gym and the equipment functions and settings. Procedures and policies of the gym were outlined and explained. The patient's individual exercise prescription and treatment plan were reviewed with them. All starting workloads were established based on the results of the functional testing  done at the initial intake visit. The plan for exercise progression was also introduced and progression will be customized based on the patient's performance and goals.  Reviewed individualized exercise prescription and made increases per departmental policy. Exercise increases were discussed with the patient and they were able to perform the new work loads without issue (no signs or symptoms). Reviewed individualized exercise prescription and made increases per departmental policy. Exercise increases were discussed with the patient and they were able to perform the new work loads without issue (no signs or symptoms).  Darius Miller can continuously exercise for the entire class and his exercise progression will now focus on intensity. We met with him and discussed interval training and he has been incorporating it into his exercise routine on the treadmill in order to progressively overload his system and allow for continual improvement. We met with him and requested that he add 1d/wk of home exercise into his routine and we will follow up with him on what he is going to do.    Frequency     Add 1 additional day to program exercise sessions.   Duration Progress to 30 minutes of continuous aerobic without signs/symptoms of physical distress Progress to 50 minutes of aerobic without signs/symptoms of physical distress Progress to 50 minutes of aerobic without signs/symptoms of physical distress  Progress to 50 minutes of aerobic without signs/symptoms of physical distress    Intensity Other (comment)  Rest +20 Rest + 30  Rest +20 Rest + 30  Rest +20  Rest + 30   Progression Continue progressive overload as per policy without signs/symptoms or physical distress. Continue progressive overload as per  policy without signs/symptoms or physical distress. Continue progressive overload as per policy without signs/symptoms or physical distress.  Continue progressive overload as per policy without signs/symptoms or physical distress.   Resistance Training   Training Prescription Yes Yes Yes  Yes   Weight _0 Reps 10-15 10-15 10-15  10-15   Interval Training   Interval Training No No Yes  Yes   Equipment   Treadmill  Treadmill   Comments   Start interval training 2.5 to 3 mph using 1 minute burst.    2.5 to 3 mph using 1 minute burst.     Treadmill   MPH  2.5 2.5 2.5 3   Grade  0 0 0 0   Minutes  _1 REL-XR   Level  _2 Watts  65 65 65 65   Minutes  _3 Biostep-RELP   Level     4   Watts     25   Minutes     20     03/25/15 4098           Exercise Review   Progression Yes       Response to Exercise   Blood Pressure (Admit) 104/60 mmHg       Blood Pressure (Exercise) 132/76 mmHg       Blood Pressure (Exit) 122/62 mmHg       Heart Rate (Admit) 67 bpm       Heart Rate (Exercise) 108 bpm       Heart Rate (Exit) 60 bpm       Rating of Perceived Exertion (Exercise) 12       Symptoms None       Comments Darius Miller progressing is slowing slightly due to him being at close to peak workloads for him. He is walking well on the TM and has gained strength in his legs from the walking and stepping machines. He continues to walk at home with his dogs and has great attendance in  class.       Frequency Add 2 additional days to program exercise sessions.       Duration Progress to 50 minutes of aerobic without signs/symptoms of physical distress       Intensity Rest + 30       Progression Continue progressive overload as per policy without  signs/symptoms or physical distress.       Resistance Training   Training Prescription Yes       Weight 3       Reps 10-15       Interval Training   Interval Training Yes       Equipment Treadmill       Comments 2.5 to 3 mph using 1 minute burst.         Treadmill   MPH 3       Grade 0       Minutes 15       REL-XR   Level 4       Watts 65       Minutes 20       Biostep-RELP   Level 4       Watts 35       Minutes 20          Discharge Exercise Prescription (Final Exercise Prescription Changes):     Exercise Prescription Changes - 03/25/15 1191  Exercise Review   Progression Yes   Response to Exercise   Blood Pressure (Admit) 104/60 mmHg   Blood Pressure (Exercise) 132/76 mmHg   Blood Pressure (Exit) 122/62 mmHg   Heart Rate (Admit) 67 bpm   Heart Rate (Exercise) 108 bpm   Heart Rate (Exit) 60 bpm   Rating of Perceived Exertion (Exercise) 12   Symptoms None   Comments Darius Miller progressing is slowing slightly due to him being at close to peak workloads for him. He is walking well on the TM and has gained strength in his legs from the walking and stepping machines. He continues to walk at home with his dogs and has great attendance in  class.   Frequency Add 2 additional days to program exercise sessions.   Duration Progress to 50 minutes of aerobic without signs/symptoms of physical distress   Intensity Rest + 30   Progression Continue progressive overload as per policy without signs/symptoms or physical distress.   Resistance Training   Training Prescription Yes   Weight 3   Reps 10-15   Interval Training   Interval Training Yes   Equipment Treadmill   Comments 2.5 to 3 mph using 1 minute burst.     Treadmill   MPH 3   Grade 0   Minutes 15   REL-XR   Level 4   Watts 65   Minutes 20   Biostep-RELP   Level 4   Watts 35   Minutes 20      Nutrition:  Target Goals: Understanding of nutrition guidelines, daily intake of sodium <1537m, cholesterol <2046m  calories 30% from fat and 7% or less from saturated fats, daily to have 5 or more servings of fruits and vegetables.  Biometrics:     Pre Biometrics - 01/21/15 1335    Pre Biometrics   Height 5' 9.4" (1.763 m)   Weight 184 lb 9.6 oz (83.734 kg)   Waist Circumference 39.75 inches   Hip Circumference 40 inches   Waist to Hip Ratio 0.99 %   BMI (Calculated) 27       Nutrition Therapy Plan and Nutrition Goals:     Nutrition Therapy & Goals - 01/21/15 1458    Nutrition Therapy   Drug/Food Interactions --  Darius Miller not to meet individually with the Cardiac Rehab registered dietician.       Nutrition Discharge: Rate Your Plate Scores:   Nutrition Goals Re-Evaluation:     Nutrition Goals Re-Evaluation      02/21/15 1645           Personal Goal #1 Re-Evaluation   Personal Goal #1 JiLaverna Peacerefers not to meet individually with the registered dietician. "I know what I need to eat".           Psychosocial: Target Goals: Acknowledge presence or absence of depression, maximize coping skills, provide positive support system. Participant is able to verbalize types and ability to use techniques and skills needed for reducing stress and depression.  Initial Review & Psychosocial Screening:     Initial Psych Review & Screening - 01/21/15 1458    Initial Review   Current issues with Current Stress Concerns   Source of Stress Concerns Retirement/disability   Comments Darius Miller he was giving his wife info to relay when he was in the hospital since [place] his mother's dining room set since moved  from a 3 bedroom home to a 2 bedroom condo since he didn't feel he could keep up with the yeardwork etc. 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    Family Dynamics   Good Support System? Yes   Barriers   Psychosocial barriers to  participate in program The patient should benefit from training in stress management and relaxation.   Screening Interventions   Interventions Encouraged to exercise      Quality of Life Scores:   PHQ-9:     Recent Review Flowsheet Data    Depression screen [facility] 2/9 01/21/2015   Decreased Interest 0   Down, Depressed, Hopeless 0   PHQ - 2 Score 0   Altered sleeping 0   Tired, decreased energy 3   Change in appetite 0   Feeling bad or failure about yourself  0   Trouble concentrating 0   Moving slowly or fidgety/restless 0   Suicidal thoughts 0   PHQ-9 Score 3   Difficult doing work/chores Somewhat difficult      Psychosocial Evaluation and Intervention:     Psychosocial Evaluation - 01/30/15 1639    Psychosocial Evaluation & Interventions   Interventions Stress management education;Relaxation education;Encouraged to exercise with the program and follow exercise prescription   Comments Counselor met with Darius Miller today for initial psychosocial evaluation.  He is a well-adjusted 70 year old who had cardiac surgery in August.  He has a strong support system with a fiance and adult children/grandchildren who live close by.  Darius Miller reports having kidney stones and some pulmonary issues as well as his cardiac illness.  He states that he sleeps well for the most part and has a good appetite.  He denies a history of depression or anxiety or current symptoms.  He states he is typically in a good mood and loves to be on the golf course.  Current stress in [place] life [place]  His goals for this program are to lose weight, to increase his stamina and strength and increase his energy level.  Counselor will follow with Darius Miller as needed.    Continued Psychosocial Services Needed Yes  Darius Miller will benefit from the psychoeducational components of this program, especially stress management and relaxation due to his recent move.  He will also  benefit from meeting with the dietician for weight loss goal to be addressed.      Psychosocial Re-Evaluation:     Psychosocial Re-Evaluation      02/21/15 1645 03/21/15 1650 03/27/15 1628       Psychosocial Re-Evaluation   Interventions Encouraged to attend Cardiac Rehabilitation for the exercise       Comments Darius Miller usually states when he attends Cardiac Rehab that he is ready to "get out of here " but he stays the whole time. Darius Miller stated he felt better today and did some honey do list things like hang a shelf. Darius Miller is ready to graduate. He is willing to do his post paperwork Depression score.         Vocational Rehabilitation: Provide vocational rehab assistance to qualifying candidates.   Vocational Rehab Evaluation & Intervention:     Vocational Rehab - 01/21/15 1405    Initial Vocational Rehab Evaluation & Intervention   Assessment shows need for Vocational Rehabilitation (p) No      Education: Education Goals: Education classes will be provided on a weekly basis, covering required  topics. Participant will state understanding/return demonstration of topics presented.  Learning Barriers/Preferences:     Learning Barriers/Preferences - 01/21/15 1455    Learning Barriers/Preferences   Learning Barriers None   Learning Preferences None      Education Topics: General Nutrition Guidelines/Fats and Fiber: -Group instruction provided by verbal, written material, models and posters to present the general guidelines for heart healthy nutrition. Gives an explanation and review of dietary fats and fiber.          Cardiac Rehab from 03/25/2015 in [facility] Cardiac and Pulmonary Rehab   Date  02/11/15   Educator  PI   Instruction Review Code  2- meets goals/outcomes      Controlling Sodium/Reading Food Labels: -Group verbal and written material supporting the discussion of sodium use in heart healthy nutrition. Review and explanation with models, verbal and written  materials for utilization of the food label.      Cardiac Rehab from 03/25/2015 in [facility] Cardiac and Pulmonary Rehab   Date  02/18/15   Educator  PI   Instruction Review Code  2- meets goals/outcomes      Exercise Physiology & Risk Factors: - Group verbal and written instruction with models to review the exercise physiology of the cardiovascular system and associated critical values. Details cardiovascular disease risk factors and the goals associated with each risk factor.      Cardiac Rehab from 03/25/2015 in [facility] Cardiac and Pulmonary Rehab   Date  03/13/15   Educator  [place]   Instruction Review Code  2- meets goals/outcomes      Aerobic Exercise & Resistance Training: - Gives group verbal and written discussion on the health impact of inactivity. On the components of aerobic and resistive training programs and the benefits of this training and how to safely progress through these programs.      Cardiac Rehab from 03/25/2015 in [facility] Cardiac and Pulmonary Rehab   Date  03/25/15   Educator  RM   Instruction Review Code  2- meets goals/outcomes      Flexibility, Balance, General Exercise Guidelines: - Provides group verbal and written instruction on the benefits of flexibility and balance training programs. Provides general exercise guidelines with specific guidelines to those with heart or lung disease. Demonstration and skill practice provided.      Cardiac Rehab from 03/25/2015 in [facility] Cardiac and Pulmonary Rehab   Date  03/25/15   Educator  RM   Instruction Review Code  2- meets goals/outcomes      Stress Management: - Provides group verbal and written instruction about the health risks of elevated stress, cause of high stress, and healthy ways to reduce stress.      Cardiac Rehab from 03/25/2015 in [facility] Cardiac and Pulmonary Rehab   Date  03/20/15   Educator  Kathreen Cornfield   Instruction Review Code  2- meets goals/outcomes      Depression: - Provides group verbal and  written instruction on the correlation between heart/lung disease and depressed mood, treatment options, and the stigmas associated with seeking treatment.      Cardiac Rehab from 03/25/2015 in [facility] Cardiac and Pulmonary Rehab   Date  02/20/15   Educator  Juliann Pulse C   Instruction Review Code  2- meets goals/outcomes      Anatomy & Physiology of the Heart: - Group verbal and written instruction and models provide basic cardiac anatomy and physiology, with the coronary electrical and arterial systems. Review of: AMI, Angina, Valve disease, Heart Failure, Cardiac  Arrhythmia, Pacemakers, and the ICD.      Cardiac Rehab from 03/25/2015 in [facility] Cardiac and Pulmonary Rehab   Date  01/28/15   Educator  S. Bice, RN   Instruction Review Code  2- meets goals/outcomes      Cardiac Procedures: - Group verbal and written instruction and models to describe the testing methods done to diagnose heart disease. Reviews the outcomes of the test results. Describes the treatment choices: Medical Management, Angioplasty, or Coronary Bypass Surgery.      Cardiac Rehab from 03/25/2015 in [facility] Cardiac and Pulmonary Rehab   Date  02/25/15   Educator  SB   Instruction Review Code  2- meets goals/outcomes      Cardiac Medications: - Group verbal and written instruction to review commonly prescribed medications for heart disease. Reviews the medication, class of the drug, and side effects. Includes the steps to properly store meds and maintain the prescription regimen.      Cardiac Rehab from 03/25/2015 in [facility] Cardiac and Pulmonary Rehab   Date  03/06/15   Educator  DW   Instruction Review Code  2- meets goals/outcomes      Go Sex-Intimacy & Heart Disease, Get SMART - Goal Setting: - Group verbal and written instruction through game format to discuss heart disease and the return to sexual intimacy. Provides group verbal and written material to discuss and apply goal setting through the application of the S.M.A.R.T.  Method.      Cardiac Rehab from 03/25/2015 in [facility] Cardiac and Pulmonary Rehab   Date  02/25/15   Educator  SB   Instruction Review Code  2- meets goals/outcomes      Other Matters of the Heart: - Provides group verbal, written materials and models to describe Heart Failure, Angina, Valve Disease, and Diabetes in the realm of heart disease. Includes description of the disease process and treatment options available to the cardiac patient.      Cardiac Rehab from 03/25/2015 in [facility] Cardiac and Pulmonary Rehab   Date  02/06/15   Educator  DW   Instruction Review Code  2- meets goals/outcomes      Exercise & Equipment Safety: - Individual verbal instruction and demonstration of equipment use and safety with use of the equipment.      Cardiac Rehab from 03/25/2015 in [facility] Cardiac and Pulmonary Rehab   Date  01/21/15   Educator  C. Emilyann Banka,.RN   Instruction Review Code  2- meets goals/outcomes      Infection Prevention: - Provides verbal and written material to individual with discussion of infection control including proper hand washing and proper equipment cleaning during exercise session.      Cardiac Rehab from 03/25/2015 in [facility] Cardiac and Pulmonary Rehab   Date  01/21/15   Educator  C. Dorlis Judice,RN   Instruction Review Code  2- meets goals/outcomes      Falls Prevention: - Provides verbal and written material to individual with discussion of falls prevention and safety.      Cardiac Rehab from 03/25/2015 in [facility] Cardiac and Pulmonary Rehab   Date  01/21/15   Educator  [place]   Instruction Review Code  2- meets goals/outcomes      Diabetes: - Individual verbal and written instruction to review signs/symptoms of diabetes, desired ranges of glucose level fasting, after meals and with exercise. Advice that pre and post exercise glucose checks will be done for 3 sessions at entry of program.    Knowledge Questionnaire Score:  Knowledge Questionnaire Score -  01/21/15 1452    Knowledge Questionnaire Score   Pre Score 26      Personal Goals and Risk Factors at Admission:   Personal Goals and Risk Factors Review:      Goals and Risk Factor Review      01/30/15 1606 01/30/15 1628 02/21/15 1642 03/20/15 1810     Weight Management   Goals Progress/Improvement seen Yes  No No    Comments Patient's initial weight was 184.6.  Past three sessions weight down to 183.3.  Today patient has a thick sweat shirt and weight is 185.  Patient is walking his little dogs at home several times a day.  Patient still wants to lose a few pounds.  He plans to play golf on the day after Thanksgiving.     Patient's weight today was 186.  Patient states he has been eating way to much recently due to the holiday season.   Patient does want to lose a couple more pounds, as he feels most comfortable around 184.      Increase Aerobic Exercise and Physical Activity   Goals Progress/Improvement seen  Yes  Yes Yes    Comments Patient states he feels the biggest difference when he goes to bed at night.  He states he is not as short of breath at night.    Trayveon left knee hurts 5/10 and right hip is 5/10 on Biostep after using the treadmill today. Patient states he feels better since he started exercising in the Cardiac Rehab program. Darius Miller stated, "I feel better and I am less short of breath."  Patient stated he can now bend over and tie his shoe without getting short of breath when before that was not the case.      Take Less Medication   Goals Progress/Improvement seen Met  Yes Met    Comments  MD discontinued Digoxin and patient stopped taking Digoxin on November 22nd, 2016.    MD cut his one pill in 1/4. blood pressure today was 108/68. Patient stated he is no longer on digoxin, as the MD stopped that med.      Hypertension   Progress seen toward goals  Yes Yes Yes    Comments  BP is well controlled.  BP 110 - 124 / 60 - 70.   108/68.  BP at check-in today 104/60 and exit BP  102/60.  BP while walking on TM 116/64.      Abnormal Lipids   Progress seen towards goals  Unknown Unknown Unknown    Comments  no new labs since starting Cardiac Rehab.    No new labs since starting Cardiac Rehab.  Patient stated he went to doctor to have his blood drawn this past week, but staff were unable to draw blood.  Patient states he needs to go back to have blood drawn.         Personal Goals Discharge (Final Personal Goals and Risk Factors Review):      Goals and Risk Factor Review - 03/20/15 1810    Weight Management   Goals Progress/Improvement seen No   Comments Patient's weight today was 186.  Patient states he has been eating way to much recently due to the holiday season.   Patient does want to lose a couple more pounds, as he feels most comfortable around 184.     Increase Aerobic Exercise and Physical Activity   Goals Progress/Improvement seen  Yes   Comments Patient states he  feels better since he started exercising in the Cardiac Rehab program. Darius Miller stated, "I feel better and I am less short of breath."  Patient stated he can now bend over and tie his shoe without getting short of breath when before that was not the case.     Take Less Medication   Goals Progress/Improvement seen Met   Comments Patient stated he is no longer on digoxin, as the MD stopped that med.     Hypertension   Progress seen toward goals Yes   Comments BP at check-in today 104/60 and exit BP 102/60.  BP while walking on TM 116/64.     Abnormal Lipids   Progress seen towards goals Unknown   Comments No new labs since starting Cardiac Rehab.  Patient stated he went to doctor to have his blood drawn this past week, but staff were unable to draw blood.  Patient states he needs to go back to have blood drawn.        ITP Comments:     ITP Comments      02/12/15 1310 03/10/15 1422         ITP Comments 30 day review preparation  Continue with ITP Ready for 30 day review.  Continue with ITP          Comments: Amante did his post 6 minute walk today. He will take his post questionarres home today.

## 2015-03-27 NOTE — Progress Notes (Signed)
Daily Session Note  Patient Details  Name: Loel Betancur MRN: [id number] Date of Birth: [birth year] Referring Provider:  Yolonda Kida, MD  Encounter Date: 03/27/2015  Check In:     Session Check In - 03/27/15 1623    Check-In   Staff Present Gerlene Burdock, RN, Drusilla Kanner, MS, ACSM CEP, Exercise Physiologist;Diane Joya Gaskins, RN, BSN   ER physicians immediately available to respond to emergencies See telemetry face sheet for immediately available ER MD   Medication changes reported     No   Fall or balance concerns reported    No   Warm-up and Cool-down Performed on first and last piece of equipment   VAD Patient? No   Pain Assessment   Currently in Pain? No/denies         Goals Met:  Proper associated with RPD/PD & O2 Sat Exercise tolerated well  Goals Unmet:  Not Applicable  Goals Comments:    Dr. Emily Filbert is Medical Director for [facility].

## 2015-04-01 NOTE — Progress Notes (Signed)
Incomplete Session Note  Patient Details  Name: Darius Miller MRN: [id number] Date of Birth: Oct 22, 1945 Referring Provider:  Alwyn Pea, MD  Loralyn Freshwater did not complete his rehab session.  Chanetta Marshall came to class stating that he has not been able to sleep in his bed the past 3 nights, because he could not breath.  He then followed with a a statement that he ran out of his fluid pills 4 days ago and was to pick them up tonight.  He had rales in both bases when I listened to his breath sounds.  He was sent straight to the pharmacy to get his prescription and to take today's dose. He was advised to call MD or 911 if needed for increased SOB or any other symptoms. Cora Collum RN CCRP

## 2015-04-01 NOTE — Progress Notes (Signed)
Cardiac Individual Treatment Plan  Patient Details  Name: Darius Miller MRN: [id number] Date of Birth: 01/24/1946 Referring Provider:  Yolonda Kida, MD  Initial Encounter Date:    Visit Diagnosis: No diagnosis found.  Patient's Home Medications on Admission:  Current outpatient prescriptions:  .  aspirin 81 MG tablet, Take 81 mg by mouth daily., Disp: , Rfl:  .  atorvastatin (LIPITOR) 40 MG tablet, Take 40 mg by mouth daily., Disp: , Rfl:  .  dabigatran (PRADAXA) 150 MG CAPS capsule, Take 150 mg by mouth 2 (two) times daily., Disp: , Rfl:  .  digoxin (LANOXIN) 0.125 MG tablet, Take 0.25 mg by mouth daily., Disp: , Rfl:  .  furosemide (LASIX) 40 MG tablet, Take 40 mg by mouth., Disp: , Rfl:  .  lisinopril (PRINIVIL,ZESTRIL) 2.5 MG tablet, Take 2.5 mg by mouth daily., Disp: , Rfl:  .  metoprolol succinate (TOPROL-XL) 25 MG 24 hr tablet, Take 12.5 mg by mouth daily., Disp: , Rfl:   Past Medical History: Past Medical History  Diagnosis Date  . Coronary artery disease     Tobacco Use: History  Smoking status  . Former Smoker -- 2.00 packs/day for 20 years  . Types: Cigarettes  Smokeless tobacco  . Not on file    Labs: Recent Review Flowsheet Data    There is no flowsheet data to display.       Exercise Target Goals:    Exercise Program Goal: Individual exercise prescription set with THRR, safety & activity barriers. Participant demonstrates ability to understand and report RPE using BORG scale, to self-measure pulse accurately, and to acknowledge the importance of the exercise prescription.  Exercise Prescription Goal: Starting with aerobic activity 30 plus minutes a day, 3 days per week for initial exercise prescription. Provide home exercise prescription and guidelines that participant acknowledges understanding prior to discharge.  Activity Barriers & Risk Stratification:     Activity Barriers & Risk Stratification - 01/21/15 1454    Activity Barriers &  Risk Stratification   Activity Barriers Arthritis   Risk Stratification High      6 Minute Walk:     6 Minute Walk      01/21/15 1342 03/27/15 1632     6 Minute Walk   Phase Initial Discharge    Distance 1185 feet 1260 feet    Distance % Change  6 %    Walk Time 6 minutes 6 minutes    Resting HR 43 bpm 81 bpm    Resting BP 110/60 mmHg 126/66 mmHg    Max Ex. HR 84 bpm 117 bpm    Max Ex. BP 114/62 mmHg 130/70 mmHg    RPE 11 11    Symptoms No No       Initial Exercise Prescription:     Initial Exercise Prescription - 01/21/15 1300    Date of Initial Exercise Prescription   Date 01/21/15   Treadmill   MPH 2   Grade 0   Minutes 10   Bike   Level 0.4   Watts 15   Minutes 10   Recumbant Bike   Level 3   RPM 40   Watts 25   Minutes 15   NuStep   Level 3   Watts 25   Minutes 15   Arm Ergometer   Level 1   Watts 8   Minutes 10   Arm/Foot Ergometer   Level 4   Watts 15   Minutes 10   Cybex  Level 2   RPM 50   Minutes 10   Recumbant Elliptical   Level 1   RPM 40   Watts 10   Minutes 10   Elliptical   Level 1   Speed 3   Minutes 1   REL-XR   Level 2   Watts 35   Minutes 10   Prescription Details   Frequency (times per week) 3   Duration Progress to 30 minutes of continuous aerobic without signs/symptoms of physical distress   Intensity   THRR REST +  20   Ratings of Perceived Exertion 11-15   Progression Continue progressive overload as per policy without signs/symptoms or physical distress.   Resistance Training   Training Prescription Yes   Weight 2   Reps 10-15      Exercise Prescription Changes:     Exercise Prescription Changes      01/23/15 1600 02/04/15 1437 02/13/15 1600 02/21/15 1421 02/27/15 1455   Exercise Review   Progression  Yes Yes  Yes   Response to Exercise   Blood Pressure (Admit)  108/74 mmHg   104/70 mmHg   Blood Pressure (Exercise)  134/64 mmHg   108/68 mmHg   Blood Pressure (Exit)  110/70 mmHg   100/60 mmHg    Heart Rate (Admit)  86 bpm   93 bpm   Heart Rate (Exercise)  105 bpm   125 bpm   Heart Rate (Exit)  88 bpm   88 bpm   Rating of Perceived Exertion (Exercise)  13   12   Symptoms None None None  None   Comments First day of exercise! Patient was oriented to the gym and the equipment functions and settings. Procedures and policies of the gym were outlined and explained. The patient's individual exercise prescription and treatment plan were reviewed with them. All starting workloads were established based on the results of the functional testing  done at the initial intake visit. The plan for exercise progression was also introduced and progression will be customized based on the patient's performance and goals.  Reviewed individualized exercise prescription and made increases per departmental policy. Exercise increases were discussed with the patient and they were able to perform the new work loads without issue (no signs or symptoms). Reviewed individualized exercise prescription and made increases per departmental policy. Exercise increases were discussed with the patient and they were able to perform the new work loads without issue (no signs or symptoms).  Darius Miller can continuously exercise for the entire class and his exercise progression will now focus on intensity. We met with him and discussed interval training and he has been incorporating it into his exercise routine on the treadmill in order to progressively overload his system and allow for continual improvement. We met with him and requested that he add 1d/wk of home exercise into his routine and we will follow up with him on what he is going to do.    Frequency     Add 1 additional day to program exercise sessions.   Duration Progress to 30 minutes of continuous aerobic without signs/symptoms of physical distress Progress to 50 minutes of aerobic without signs/symptoms of physical distress Progress to 50 minutes of aerobic without signs/symptoms of  physical distress  Progress to 50 minutes of aerobic without signs/symptoms of physical distress   Intensity Other (comment)  Rest +20 Rest + 30  Rest +20 Rest + 30  Rest +20  Rest + 30   Progression Continue progressive overload as  per policy without signs/symptoms or physical distress. Continue progressive overload as per policy without signs/symptoms or physical distress. Continue progressive overload as per policy without signs/symptoms or physical distress.  Continue progressive overload as per policy without signs/symptoms or physical distress.   Resistance Training   Training Prescription Yes Yes Yes  Yes   Weight _0 Reps 10-15 10-15 10-15  10-15   Interval Training   Interval Training No No Yes  Yes   Equipment   Treadmill  Treadmill   Comments   Start interval training 2.5 to 3 mph using 1 minute burst.    2.5 to 3 mph using 1 minute burst.     Treadmill   MPH  2.5 2.5 2.5 3   Grade  0 0 0 0   Minutes  _1 REL-XR   Level  _2 Watts  65 65 65 65   Minutes  _3 Biostep-RELP   Level     4   Watts     25   Minutes     20     03/25/15 0651 03/27/15 1600         Exercise Review   Progression Yes Yes      Response to Exercise   Blood Pressure (Admit) 104/60 mmHg       Blood Pressure (Exercise) 132/76 mmHg       Blood Pressure (Exit) 122/62 mmHg       Heart Rate (Admit) 67 bpm       Heart Rate (Exercise) 108 bpm       Heart Rate (Exit) 60 bpm       Rating of Perceived Exertion (Exercise) 12       Symptoms None None      Comments Jimmy's progressing is slowing slightly due to him being at close to peak workloads for him. He is walking well on the TM and has gained strength in his legs from the walking and stepping machines. He continues to walk at home with his dogs and has great attendance in  class. Patient is approaching graduation of the program and home exercise plans were discussed. Details of the patient's exercise prescription and  what they need to do in order to continue the prescription and progress with exercise were outlined and the patient verbalized understanding. The patient plans to complete all exercise at the fitness center at [place] 2 additional days to program exercise sessions. Add 2 additional days to program exercise sessions.      Duration Progress to 50 minutes of aerobic without signs/symptoms of physical distress Progress to 50 minutes of aerobic without signs/symptoms of physical distress      Intensity Rest + 30 Rest + 30      Progression Continue progressive overload as per policy without signs/symptoms or physical distress. Continue progressive overload as per policy without signs/symptoms or physical distress.      Resistance Training   Training Prescription Yes Yes      Weight 3 3      Reps 10-15 10-15      Interval Training   Interval Training Yes Yes      Equipment Treadmill Treadmill      Comments 2.5 to 3 mph using 1 minute burst.   2.5 to 3 mph using 1 minute burst.  Treadmill   MPH 3 3      Grade 0 0      Minutes 15 15      REL-XR   Level 4 4      Watts 65 65      Minutes 20 20      Biostep-RELP   Level 4 4      Watts 35 35      Minutes 20 20      Home Exercise Plan   Plans to continue exercise at  [facility]         Discharge Exercise Prescription (Final Exercise Prescription Changes):     Exercise Prescription Changes - 03/27/15 1600    Exercise Review   Progression Yes   Response to Exercise   Symptoms None   Comments Patient is approaching graduation of the program and home exercise plans were discussed. Details of the patient's exercise prescription and what they need to do in order to continue the prescription and progress with exercise were outlined and the patient verbalized understanding. The patient plans to complete all exercise at the fitness center at [place] 2 additional days to program exercise sessions.    Duration Progress to 50 minutes of aerobic without signs/symptoms of physical distress   Intensity Rest + 30   Progression Continue progressive overload as per policy without signs/symptoms or physical distress.   Resistance Training   Training Prescription Yes   Weight 3   Reps 10-15   Interval Training   Interval Training Yes   Equipment Treadmill   Comments 2.5 to 3 mph using 1 minute burst.     Treadmill   MPH 3   Grade 0   Minutes 15   REL-XR   Level 4   Watts 65   Minutes 20   Biostep-RELP   Level 4   Watts 35   Minutes 20   Home Exercise Plan   Plans to continue exercise at [facility]      Nutrition:  Target Goals: Understanding of nutrition guidelines, daily intake of sodium '1500mg'$ , cholesterol '200mg'$ , calories 30% from fat and 7% or less from saturated fats, daily to have 5 or more servings of fruits and vegetables.  Biometrics:     Pre Biometrics - 01/21/15 1335    Pre Biometrics   Height 5' 9.4" (1.763 m)   Weight 184 lb 9.6 oz (83.734 kg)   Waist Circumference 39.75 inches   Hip Circumference 40 inches   Waist to Hip Ratio 0.99 %   BMI (Calculated) 27       Nutrition Therapy Plan and Nutrition Goals:     Nutrition Therapy & Goals - 03/27/15 1649    Personal Nutrition Goals   Personal Goal #1 Darius Miller said his nutrition is doing well and that he is maintaining a heart healthy diet      Nutrition Discharge: Rate Your Plate Scores:   Nutrition Goals Re-Evaluation:     Nutrition Goals Re-Evaluation      02/21/15 1645 04/01/15 1449         Personal Goal #1 Re-Evaluation   Personal Goal #1 Darius Miller prefers not to meet individually with the registered dietician. "I know what I need to eat".        Goal Progress Seen  Yes         Psychosocial: Target Goals: Acknowledge presence or absence of depression, maximize coping skills, provide positive support system. Participant is able to  verbalize types and ability to use techniques and  skills needed for reducing stress and depression.  Initial Review & Psychosocial Screening:     Initial Psych Review & Screening - 01/21/15 1458    Initial Review   Current issues with Current Stress Concerns   Source of Stress Concerns Retirement/disability   Comments Kien reported he was giving his wife info to relay when he was in the hospital since they were selling their house in [place] to move closer to [place] and where he grew up in [place], [place]. He had to sell his mother's dining room set since moved from a 3 bedroom home to a 2 bedroom condo since he didn't feel he could keep up with the yeardwork etc. 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    Family Dynamics   Good Support System? Yes   Barriers   Psychosocial barriers to participate in program The patient should benefit from training in stress management and relaxation.   Screening Interventions   Interventions Encouraged to exercise      Quality of Life Scores:   PHQ-9:     Recent Review Flowsheet Data    Depression screen [facility] 2/9 01/21/2015   Decreased Interest 0   Down, Depressed, Hopeless 0   PHQ - 2 Score 0   Altered sleeping 0   Tired, decreased energy 3   Change in appetite 0   Feeling bad or failure about yourself  0   Trouble concentrating 0   Moving slowly or fidgety/restless 0   Suicidal thoughts 0   PHQ-9 Score 3   Difficult doing work/chores Somewhat difficult      Psychosocial Evaluation and Intervention:     Psychosocial Evaluation - 01/30/15 1639    Psychosocial Evaluation & Interventions   Interventions Stress management education;Relaxation education;Encouraged to exercise with the program and follow exercise prescription   Comments Counselor met with Mr. Hashimi today for initial psychosocial evaluation.  He is a well-adjusted 70 year old who had cardiac surgery in August.  He has a strong support system with a fiance and adult children/grandchildren  who live close by.  Mr. Sales reports having kidney stones and some pulmonary issues as well as his cardiac illness.  He states that he sleeps well for the most part and has a good appetite.  He denies a history of depression or anxiety or current symptoms.  He states he is typically in a good mood and loves to be on the golf course.  Current stress in [place] life [place]  His goals for this program are to lose weight, to increase his stamina and strength and increase his energy level.  Counselor will follow with Mr. Pasternak as needed.    Continued Psychosocial Services Needed Yes  Mr. Guaman will benefit from the psychoeducational components of this program, especially stress management and relaxation due to his recent move.  He will also benefit from meeting with the dietician for weight loss goal to be addressed.      Psychosocial Re-Evaluation:     Psychosocial Re-Evaluation      02/21/15 1645 03/21/15 1650 03/27/15 1628       Psychosocial Re-Evaluation   Interventions Encouraged to attend Cardiac Rehabilitation for the exercise       Comments Lem usually states when he attends Cardiac Rehab that he is ready to "get out of here " but he stays the whole  time. Keary stated he felt better today and did some honey do list things like hang a shelf. Julious is ready to graduate. He is willing to do his post paperwork Depression score.         Vocational Rehabilitation: Provide vocational rehab assistance to qualifying candidates.   Vocational Rehab Evaluation & Intervention:     Vocational Rehab - 01/21/15 1405    Initial Vocational Rehab Evaluation & Intervention   Assessment shows need for Vocational Rehabilitation (p) No      Education: Education Goals: Education classes will be provided on a weekly basis, covering required topics. Participant will state understanding/return demonstration of topics presented.  Learning  Barriers/Preferences:     Learning Barriers/Preferences - 01/21/15 1455    Learning Barriers/Preferences   Learning Barriers None   Learning Preferences None      Education Topics: General Nutrition Guidelines/Fats and Fiber: -Group instruction provided by verbal, written material, models and posters to present the general guidelines for heart healthy nutrition. Gives an explanation and review of dietary fats and fiber.          Cardiac Rehab from 03/25/2015 in [facility] Cardiac and Pulmonary Rehab   Date  02/11/15   Educator  PI   Instruction Review Code  2- meets goals/outcomes      Controlling Sodium/Reading Food Labels: -Group verbal and written material supporting the discussion of sodium use in heart healthy nutrition. Review and explanation with models, verbal and written materials for utilization of the food label.      Cardiac Rehab from 03/25/2015 in [facility] Cardiac and Pulmonary Rehab   Date  02/18/15   Educator  PI   Instruction Review Code  2- meets goals/outcomes      Exercise Physiology & Risk Factors: - Group verbal and written instruction with models to review the exercise physiology of the cardiovascular system and associated critical values. Details cardiovascular disease risk factors and the goals associated with each risk factor.      Cardiac Rehab from 03/25/2015 in [facility] Cardiac and Pulmonary Rehab   Date  03/13/15   Educator  [place]   Instruction Review Code  2- meets goals/outcomes      Aerobic Exercise & Resistance Training: - Gives group verbal and written discussion on the health impact of inactivity. On the components of aerobic and resistive training programs and the benefits of this training and how to safely progress through these programs.      Cardiac Rehab from 03/25/2015 in [facility] Cardiac and Pulmonary Rehab   Date  03/25/15   Educator  RM   Instruction Review Code  2- meets goals/outcomes      Flexibility, Balance, General Exercise  Guidelines: - Provides group verbal and written instruction on the benefits of flexibility and balance training programs. Provides general exercise guidelines with specific guidelines to those with heart or lung disease. Demonstration and skill practice provided.      Cardiac Rehab from 03/25/2015 in [facility] Cardiac and Pulmonary Rehab   Date  03/25/15   Educator  RM   Instruction Review Code  2- meets goals/outcomes      Stress Management: - Provides group verbal and written instruction about the health risks of elevated stress, cause of high stress, and healthy ways to reduce stress.      Cardiac Rehab from 03/25/2015 in [facility] Cardiac and Pulmonary Rehab   Date  03/20/15   Educator  Kathreen Cornfield   Instruction Review Code  2- meets goals/outcomes  Depression: - Provides group verbal and written instruction on the correlation between heart/lung disease and depressed mood, treatment options, and the stigmas associated with seeking treatment.      Cardiac Rehab from 03/25/2015 in [facility] Cardiac and Pulmonary Rehab   Date  02/20/15   Educator  Juliann Pulse C   Instruction Review Code  2- meets goals/outcomes      Anatomy & Physiology of the Heart: - Group verbal and written instruction and models provide basic cardiac anatomy and physiology, with the coronary electrical and arterial systems. Review of: AMI, Angina, Valve disease, Heart Failure, Cardiac Arrhythmia, Pacemakers, and the ICD.      Cardiac Rehab from 03/25/2015 in [facility] Cardiac and Pulmonary Rehab   Date  01/28/15   Educator  S. Bice, RN   Instruction Review Code  2- meets goals/outcomes      Cardiac Procedures: - Group verbal and written instruction and models to describe the testing methods done to diagnose heart disease. Reviews the outcomes of the test results. Describes the treatment choices: Medical Management, Angioplasty, or Coronary Bypass Surgery.      Cardiac Rehab from 03/25/2015 in [facility] Cardiac and Pulmonary Rehab   Date   02/25/15   Educator  SB   Instruction Review Code  2- meets goals/outcomes      Cardiac Medications: - Group verbal and written instruction to review commonly prescribed medications for heart disease. Reviews the medication, class of the drug, and side effects. Includes the steps to properly store meds and maintain the prescription regimen.      Cardiac Rehab from 03/25/2015 in [facility] Cardiac and Pulmonary Rehab   Date  03/06/15   Educator  DW   Instruction Review Code  2- meets goals/outcomes      Go Sex-Intimacy & Heart Disease, Get SMART - Goal Setting: - Group verbal and written instruction through game format to discuss heart disease and the return to sexual intimacy. Provides group verbal and written material to discuss and apply goal setting through the application of the S.M.A.R.T. Method.      Cardiac Rehab from 03/25/2015 in [facility] Cardiac and Pulmonary Rehab   Date  02/25/15   Educator  SB   Instruction Review Code  2- meets goals/outcomes      Other Matters of the Heart: - Provides group verbal, written materials and models to describe Heart Failure, Angina, Valve Disease, and Diabetes in the realm of heart disease. Includes description of the disease process and treatment options available to the cardiac patient.      Cardiac Rehab from 03/25/2015 in [facility] Cardiac and Pulmonary Rehab   Date  02/06/15   Educator  DW   Instruction Review Code  2- meets goals/outcomes      Exercise & Equipment Safety: - Individual verbal instruction and demonstration of equipment use and safety with use of the equipment.      Cardiac Rehab from 03/25/2015 in [facility] Cardiac and Pulmonary Rehab   Date  01/21/15   Educator  C. Yassin Scales,.RN   Instruction Review Code  2- meets goals/outcomes      Infection Prevention: - Provides verbal and written material to individual with discussion of infection control including proper hand washing and proper equipment cleaning during exercise session.       Cardiac Rehab from 03/25/2015 in [facility] Cardiac and Pulmonary Rehab   Date  01/21/15   Educator  C. Kambree Krauss,RN   Instruction Review Code  2- meets goals/outcomes      Falls Prevention: -  Provides verbal and written material to individual with discussion of falls prevention and safety.      Cardiac Rehab from 03/25/2015 in [facility] Cardiac and Pulmonary Rehab   Date  01/21/15   Educator  C. [place]   Instruction Review Code  2- meets goals/outcomes      Diabetes: - Individual verbal and written instruction to review signs/symptoms of diabetes, desired ranges of glucose level fasting, after meals and with exercise. Advice that pre and post exercise glucose checks will be done for 3 sessions at entry of program.    Knowledge Questionnaire Score:     Knowledge Questionnaire Score - 01/21/15 1452    Knowledge Questionnaire Score   Pre Score 26      Personal Goals and Risk Factors at Admission:   Personal Goals and Risk Factors Review:      Goals and Risk Factor Review      01/30/15 1606 01/30/15 1628 02/21/15 1642 03/20/15 1810 04/01/15 1449   Weight Management   Goals Progress/Improvement seen Yes  No No    Comments Patient's initial weight was 184.6.  Past three sessions weight down to 183.3.  Today patient has a thick sweat shirt and weight is 185.  Patient is walking his little dogs at home several times a day.  Patient still wants to lose a few pounds.  He plans to play golf on the day after Thanksgiving.     Patient's weight today was 186.  Patient states he has been eating way to much recently due to the holiday season.   Patient does want to lose a couple more pounds, as he feels most comfortable around 184.      Increase Aerobic Exercise and Physical Activity   Goals Progress/Improvement seen  Yes  Yes Yes    Comments Patient states he feels the biggest difference when he goes to bed at night.  He states he is not as short of breath at night.    Joy left knee hurts 5/10  and right hip is 5/10 on Biostep after using the treadmill today. Patient states he feels better since he started exercising in the Cardiac Rehab program. Darius Miller stated, "I feel better and I am less short of breath."  Patient stated he can now bend over and tie his shoe without getting short of breath when before that was not the case.      Take Less Medication   Goals Progress/Improvement seen Met  Yes Met    Comments  MD discontinued Digoxin and patient stopped taking Digoxin on November 22nd, 2016.    MD cut his one pill in 1/4. blood pressure today was 108/68. Patient stated he is no longer on digoxin, as the MD stopped that med.      Hypertension   Progress seen toward goals  Yes Yes Yes Yes   Comments  BP is well controlled.  BP 110 - 124 / 60 - 70.   108/68.  BP at check-in today 104/60 and exit BP 102/60.  BP while walking on TM 116/64.      Abnormal Lipids   Progress seen towards goals  Unknown Unknown Unknown Unknown   Comments  no new labs since starting Cardiac Rehab.    No new labs since starting Cardiac Rehab.  Patient stated he went to doctor to have his blood drawn this past week, but staff were unable to draw blood.  Patient states he needs to go back to have blood drawn.  Personal Goals Discharge (Final Personal Goals and Risk Factors Review):      Goals and Risk Factor Review - 04/01/15 1449    Hypertension   Progress seen toward goals Yes   Abnormal Lipids   Progress seen towards goals Unknown      ITP Comments:     ITP Comments      02/12/15 1310 03/10/15 1422         ITP Comments 30 day review preparation  Continue with ITP Ready for 30 day review.  Continue with ITP         Comments: Ready to graduate

## 2015-04-01 NOTE — Patient Instructions (Addendum)
Discharge Instructions  Patient Details  Name: Darius Miller MRN: [id number] Date of Birth: 1945/04/06 Referring Provider:  Alwyn Pea, MD   Number of Visits:   Reason for Discharge:  Patient reached a stable level of exercise. Patient independent in their exercise.  Smoking History:  History  Smoking status  . Former Smoker -- 2.00 packs/day for 20 years  . Types: Cigarettes  Smokeless tobacco  . Not on file    Diagnosis:  No diagnosis found.  Initial Exercise Prescription:     Initial Exercise Prescription - 01/21/15 1300    Date of Initial Exercise Prescription   Date 01/21/15   Treadmill   MPH 2   Grade 0   Minutes 10   Bike   Level 0.4   Watts 15   Minutes 10   Recumbant Bike   Level 3   RPM 40   Watts 25   Minutes 15   NuStep   Level 3   Watts 25   Minutes 15   Arm Ergometer   Level 1   Watts 8   Minutes 10   Arm/Foot Ergometer   Level 4   Watts 15   Minutes 10   Cybex   Level 2   RPM 50   Minutes 10   Recumbant Elliptical   Level 1   RPM 40   Watts 10   Minutes 10   Elliptical   Level 1   Speed 3   Minutes 1   REL-XR   Level 2   Watts 35   Minutes 10   Prescription Details   Frequency (times per week) 3   Duration Progress to 30 minutes of continuous aerobic without signs/symptoms of physical distress   Intensity   THRR REST +  20   Ratings of Perceived Exertion 11-15   Progression Continue progressive overload as per policy without signs/symptoms or physical distress.   Resistance Training   Training Prescription Yes   Weight 2   Reps 10-15      Discharge Exercise Prescription (Final Exercise Prescription Changes):     Exercise Prescription Changes - 03/27/15 1600    Exercise Review   Progression Yes   Response to Exercise   Symptoms None   Comments Patient is approaching graduation of the program and home exercise plans were discussed. Details of the patient's exercise prescription and what they need to  do in order to continue the prescription and progress with exercise were outlined and the patient verbalized understanding. The patient plans to complete all exercise at the fitness center at [facility]   Frequency Add 2 additional days to program exercise sessions.   Duration Progress to 50 minutes of aerobic without signs/symptoms of physical distress   Intensity Rest + 30   Progression Continue progressive overload as per policy without signs/symptoms or physical distress.   Resistance Training   Training Prescription Yes   Weight 3   Reps 10-15   Interval Training   Interval Training Yes   Equipment Treadmill   Comments 2.5 to 3 mph using 1 minute burst.     Treadmill   MPH 3   Grade 0   Minutes 15   REL-XR   Level 4   Watts 65   Minutes 20   Biostep-RELP   Level 4   Watts 35   Minutes 20   Home Exercise Plan   Plans to continue exercise at [facility]      Functional Capacity:  6 Minute Walk      01/21/15 1342 03/27/15 1632     6 Minute Walk   Phase Initial Discharge    Distance 1185 feet 1260 feet    Distance % Change  6 %    Walk Time 6 minutes 6 minutes    Resting HR 43 bpm 81 bpm    Resting BP 110/60 mmHg 126/66 mmHg    Max Ex. HR 84 bpm 117 bpm    Max Ex. BP 114/62 mmHg 130/70 mmHg    RPE 11 11    Symptoms No No       Quality of Life:   Personal Goals: Goals established at orientation with interventions provided to work toward goal.    Personal Goals Discharge:     Goals and Risk Factor Review - 04/01/15 1449    Hypertension   Progress seen toward goals Yes   Abnormal Lipids   Progress seen towards goals Unknown      Nutrition & Weight - Outcomes:     Pre Biometrics - 01/21/15 1335    Pre Biometrics   Height 5' 9.4" (1.763 m)   Weight 184 lb 9.6 oz (83.734 kg)   Waist Circumference 39.75 inches   Hip Circumference 40 inches   Waist to Hip Ratio 0.99 %   BMI (Calculated) 27       Nutrition:     Nutrition Therapy &  Goals - 03/27/15 1649    Personal Nutrition Goals   Personal Goal #1 Chanetta Marshall said his nutrition is doing well and that he is maintaining a heart healthy diet      Nutrition Discharge:   Education Questionnaire Score:     Knowledge Questionnaire Score - 01/21/15 1452    Knowledge Questionnaire Score   Pre Score 26      Goals reviewed with patient; copy given to patient. Discharge Instructions  Patient Details  Name: Darius Miller MRN: [id number] Date of Birth: Aug 13, 1945 Referring Provider:  Alwyn Pea, MD   Number of Visits:   Reason for Discharge:  Patient reached a stable level of exercise. Patient independent in their exercise.  Smoking History:  History  Smoking status  . Former Smoker -- 2.00 packs/day for 20 years  . Types: Cigarettes  Smokeless tobacco  . Not on file    Diagnosis:  No diagnosis found.  Initial Exercise Prescription:     Initial Exercise Prescription - 01/21/15 1300    Date of Initial Exercise Prescription   Date 01/21/15   Treadmill   MPH 2   Grade 0   Minutes 10   Bike   Level 0.4   Watts 15   Minutes 10   Recumbant Bike   Level 3   RPM 40   Watts 25   Minutes 15   NuStep   Level 3   Watts 25   Minutes 15   Arm Ergometer   Level 1   Watts 8   Minutes 10   Arm/Foot Ergometer   Level 4   Watts 15   Minutes 10   Cybex   Level 2   RPM 50   Minutes 10   Recumbant Elliptical   Level 1   RPM 40   Watts 10   Minutes 10   Elliptical   Level 1   Speed 3   Minutes 1   REL-XR   Level 2   Watts 35   Minutes 10   Prescription Details   Frequency (  times per week) 3   Duration Progress to 30 minutes of continuous aerobic without signs/symptoms of physical distress   Intensity   THRR REST +  20   Ratings of Perceived Exertion 11-15   Progression Continue progressive overload as per policy without signs/symptoms or physical distress.   Resistance Training   Training Prescription Yes   Weight 2   Reps  10-15      Discharge Exercise Prescription (Final Exercise Prescription Changes):     Exercise Prescription Changes - 03/27/15 1600    Exercise Review   Progression Yes   Response to Exercise   Symptoms None   Comments Patient is approaching graduation of the program and home exercise plans were discussed. Details of the patient's exercise prescription and what they need to do in order to continue the prescription and progress with exercise were outlined and the patient verbalized understanding. The patient plans to complete all exercise at the fitness center at [facility]   Frequency Add 2 additional days to program exercise sessions.   Duration Progress to 50 minutes of aerobic without signs/symptoms of physical distress   Intensity Rest + 30   Progression Continue progressive overload as per policy without signs/symptoms or physical distress.   Resistance Training   Training Prescription Yes   Weight 3   Reps 10-15   Interval Training   Interval Training Yes   Equipment Treadmill   Comments 2.5 to 3 mph using 1 minute burst.     Treadmill   MPH 3   Grade 0   Minutes 15   REL-XR   Level 4   Watts 65   Minutes 20   Biostep-RELP   Level 4   Watts 35   Minutes 20   Home Exercise Plan   Plans to continue exercise at [facility]      Functional Capacity:     6 Minute Walk      01/21/15 1342 03/27/15 1632     6 Minute Walk   Phase Initial Discharge    Distance 1185 feet 1260 feet    Distance % Change  6 %    Walk Time 6 minutes 6 minutes    Resting HR 43 bpm 81 bpm    Resting BP 110/60 mmHg 126/66 mmHg    Max Ex. HR 84 bpm 117 bpm    Max Ex. BP 114/62 mmHg 130/70 mmHg    RPE 11 11    Symptoms No No       Quality of Life:     Quality of Life - 04/01/15 1823    Quality of Life Scores   Health/Function Post 23.8 %   Health/Function % Change 31 %   Socioeconomic Post 27.33 %   Socioeconomic % Change 8 %   Psych/Spiritual Post 28.93 %   Psych/Spiritual  % Change 5 %   Family Post 30 %   Family % Change 35 %   GLOBAL Post 26.47 %   GLOBAL % Change 19 %      Personal Goals: Goals established at orientation with interventions provided to work toward goal.    Personal Goals Discharge:     Goals and Risk Factor Review - 04/01/15 1449    Hypertension   Progress seen toward goals Yes   Abnormal Lipids   Progress seen towards goals Unknown      Nutrition & Weight - Outcomes:     Pre Biometrics - 01/21/15 1335    Pre Biometrics  Height 5' 9.4" (1.763 m)   Weight 184 lb 9.6 oz (83.734 kg)   Waist Circumference 39.75 inches   Hip Circumference 40 inches   Waist to Hip Ratio 0.99 %   BMI (Calculated) 27       Nutrition:     Nutrition Therapy & Goals - 03/27/15 1649    Personal Nutrition Goals   Personal Goal #1 Chanetta Marshall said his nutrition is doing well and that he is maintaining a heart healthy diet      Nutrition Discharge:     Rate Your Plate - 69/62/95 1822    Rate Your Plate Scores   Post Score 71   Post Score % 78.8 %      Education Questionnaire Score:     Knowledge Questionnaire Score - 04/01/15 1822    Knowledge Questionnaire Score   Post Score 23      Goals reviewed with patient; copy given to patient.

## 2015-04-01 NOTE — Progress Notes (Signed)
Cardiac Individual Treatment Plan  Patient Details  Name: Darius Miller MRN: [id number] Date of Birth: 01/24/1946 Referring Provider:  Yolonda Kida, MD  Initial Encounter Date:    Visit Diagnosis: No diagnosis found.  Patient's Home Medications on Admission:  Current outpatient prescriptions:  .  aspirin 81 MG tablet, Take 81 mg by mouth daily., Disp: , Rfl:  .  atorvastatin (LIPITOR) 40 MG tablet, Take 40 mg by mouth daily., Disp: , Rfl:  .  dabigatran (PRADAXA) 150 MG CAPS capsule, Take 150 mg by mouth 2 (two) times daily., Disp: , Rfl:  .  digoxin (LANOXIN) 0.125 MG tablet, Take 0.25 mg by mouth daily., Disp: , Rfl:  .  furosemide (LASIX) 40 MG tablet, Take 40 mg by mouth., Disp: , Rfl:  .  lisinopril (PRINIVIL,ZESTRIL) 2.5 MG tablet, Take 2.5 mg by mouth daily., Disp: , Rfl:  .  metoprolol succinate (TOPROL-XL) 25 MG 24 hr tablet, Take 12.5 mg by mouth daily., Disp: , Rfl:   Past Medical History: Past Medical History  Diagnosis Date  . Coronary artery disease     Tobacco Use: History  Smoking status  . Former Smoker -- 2.00 packs/day for 20 years  . Types: Cigarettes  Smokeless tobacco  . Not on file    Labs: Recent Review Flowsheet Data    There is no flowsheet data to display.       Exercise Target Goals:    Exercise Program Goal: Individual exercise prescription set with THRR, safety & activity barriers. Participant demonstrates ability to understand and report RPE using BORG scale, to self-measure pulse accurately, and to acknowledge the importance of the exercise prescription.  Exercise Prescription Goal: Starting with aerobic activity 30 plus minutes a day, 3 days per week for initial exercise prescription. Provide home exercise prescription and guidelines that participant acknowledges understanding prior to discharge.  Activity Barriers & Risk Stratification:     Activity Barriers & Risk Stratification - 01/21/15 1454    Activity Barriers &  Risk Stratification   Activity Barriers Arthritis   Risk Stratification High      6 Minute Walk:     6 Minute Walk      01/21/15 1342 03/27/15 1632     6 Minute Walk   Phase Initial Discharge    Distance 1185 feet 1260 feet    Distance % Change  6 %    Walk Time 6 minutes 6 minutes    Resting HR 43 bpm 81 bpm    Resting BP 110/60 mmHg 126/66 mmHg    Max Ex. HR 84 bpm 117 bpm    Max Ex. BP 114/62 mmHg 130/70 mmHg    RPE 11 11    Symptoms No No       Initial Exercise Prescription:     Initial Exercise Prescription - 01/21/15 1300    Date of Initial Exercise Prescription   Date 01/21/15   Treadmill   MPH 2   Grade 0   Minutes 10   Bike   Level 0.4   Watts 15   Minutes 10   Recumbant Bike   Level 3   RPM 40   Watts 25   Minutes 15   NuStep   Level 3   Watts 25   Minutes 15   Arm Ergometer   Level 1   Watts 8   Minutes 10   Arm/Foot Ergometer   Level 4   Watts 15   Minutes 10   Cybex  Level 2   RPM 50   Minutes 10   Recumbant Elliptical   Level 1   RPM 40   Watts 10   Minutes 10   Elliptical   Level 1   Speed 3   Minutes 1   REL-XR   Level 2   Watts 35   Minutes 10   Prescription Details   Frequency (times per week) 3   Duration Progress to 30 minutes of continuous aerobic without signs/symptoms of physical distress   Intensity   THRR REST +  20   Ratings of Perceived Exertion 11-15   Progression Continue progressive overload as per policy without signs/symptoms or physical distress.   Resistance Training   Training Prescription Yes   Weight 2   Reps 10-15      Exercise Prescription Changes:     Exercise Prescription Changes      01/23/15 1600 02/04/15 1437 02/13/15 1600 02/21/15 1421 02/27/15 1455   Exercise Review   Progression  Yes Yes  Yes   Response to Exercise   Blood Pressure (Admit)  108/74 mmHg   104/70 mmHg   Blood Pressure (Exercise)  134/64 mmHg   108/68 mmHg   Blood Pressure (Exit)  110/70 mmHg   100/60 mmHg    Heart Rate (Admit)  86 bpm   93 bpm   Heart Rate (Exercise)  105 bpm   125 bpm   Heart Rate (Exit)  88 bpm   88 bpm   Rating of Perceived Exertion (Exercise)  13   12   Symptoms None None None  None   Comments First day of exercise! Patient was oriented to the gym and the equipment functions and settings. Procedures and policies of the gym were outlined and explained. The patient's individual exercise prescription and treatment plan were reviewed with them. All starting workloads were established based on the results of the functional testing  done at the initial intake visit. The plan for exercise progression was also introduced and progression will be customized based on the patient's performance and goals.  Reviewed individualized exercise prescription and made increases per departmental policy. Exercise increases were discussed with the patient and they were able to perform the new work loads without issue (no signs or symptoms). Reviewed individualized exercise prescription and made increases per departmental policy. Exercise increases were discussed with the patient and they were able to perform the new work loads without issue (no signs or symptoms).  Darius Miller can continuously exercise for the entire class and his exercise progression will now focus on intensity. We met with him and discussed interval training and he has been incorporating it into his exercise routine on the treadmill in order to progressively overload his system and allow for continual improvement. We met with him and requested that he add 1d/wk of home exercise into his routine and we will follow up with him on what he is going to do.    Frequency     Add 1 additional day to program exercise sessions.   Duration Progress to 30 minutes of continuous aerobic without signs/symptoms of physical distress Progress to 50 minutes of aerobic without signs/symptoms of physical distress Progress to 50 minutes of aerobic without signs/symptoms of  physical distress  Progress to 50 minutes of aerobic without signs/symptoms of physical distress   Intensity Other (comment)  Rest +20 Rest + 30  Rest +20 Rest + 30  Rest +20  Rest + 30   Progression Continue progressive overload as  per policy without signs/symptoms or physical distress. Continue progressive overload as per policy without signs/symptoms or physical distress. Continue progressive overload as per policy without signs/symptoms or physical distress.  Continue progressive overload as per policy without signs/symptoms or physical distress.   Resistance Training   Training Prescription Yes Yes Yes  Yes   Weight _0 Reps 10-15 10-15 10-15  10-15   Interval Training   Interval Training No No Yes  Yes   Equipment   Treadmill  Treadmill   Comments   Start interval training 2.5 to 3 mph using 1 minute burst.    2.5 to 3 mph using 1 minute burst.     Treadmill   MPH  2.5 2.5 2.5 3   Grade  0 0 0 0   Minutes  _1 REL-XR   Level  _2 Watts  65 65 65 65   Minutes  _3 Biostep-RELP   Level     4   Watts     25   Minutes     20     03/25/15 0651 03/27/15 1600         Exercise Review   Progression Yes Yes      Response to Exercise   Blood Pressure (Admit) 104/60 mmHg       Blood Pressure (Exercise) 132/76 mmHg       Blood Pressure (Exit) 122/62 mmHg       Heart Rate (Admit) 67 bpm       Heart Rate (Exercise) 108 bpm       Heart Rate (Exit) 60 bpm       Rating of Perceived Exertion (Exercise) 12       Symptoms None None      Comments Darius Miller's progressing is slowing slightly due to him being at close to peak workloads for him. He is walking well on the TM and has gained strength in his legs from the walking and stepping machines. He continues to walk at home with his dogs and has great attendance in  class. Patient is approaching graduation of the program and home exercise plans were discussed. Details of the patient's exercise prescription and  what they need to do in order to continue the prescription and progress with exercise were outlined and the patient verbalized understanding. The patient plans to complete all exercise at the fitness center at [place] 2 additional days to program exercise sessions. Add 2 additional days to program exercise sessions.      Duration Progress to 50 minutes of aerobic without signs/symptoms of physical distress Progress to 50 minutes of aerobic without signs/symptoms of physical distress      Intensity Rest + 30 Rest + 30      Progression Continue progressive overload as per policy without signs/symptoms or physical distress. Continue progressive overload as per policy without signs/symptoms or physical distress.      Resistance Training   Training Prescription Yes Yes      Weight 3 3      Reps 10-15 10-15      Interval Training   Interval Training Yes Yes      Equipment Treadmill Treadmill      Comments 2.5 to 3 mph using 1 minute burst.   2.5 to 3 mph using 1 minute burst.  Treadmill   MPH 3 3      Grade 0 0      Minutes 15 15      REL-XR   Level 4 4      Watts 65 65      Minutes 20 20      Biostep-RELP   Level 4 4      Watts 35 35      Minutes 20 20      Home Exercise Plan   Plans to continue exercise at  [facility]         Discharge Exercise Prescription (Final Exercise Prescription Changes):     Exercise Prescription Changes - 03/27/15 1600    Exercise Review   Progression Yes   Response to Exercise   Symptoms None   Comments Patient is approaching graduation of the program and home exercise plans were discussed. Details of the patient's exercise prescription and what they need to do in order to continue the prescription and progress with exercise were outlined and the patient verbalized understanding. The patient plans to complete all exercise at the fitness center at [place] 2 additional days to program exercise sessions.    Duration Progress to 50 minutes of aerobic without signs/symptoms of physical distress   Intensity Rest + 30   Progression Continue progressive overload as per policy without signs/symptoms or physical distress.   Resistance Training   Training Prescription Yes   Weight 3   Reps 10-15   Interval Training   Interval Training Yes   Equipment Treadmill   Comments 2.5 to 3 mph using 1 minute burst.     Treadmill   MPH 3   Grade 0   Minutes 15   REL-XR   Level 4   Watts 65   Minutes 20   Biostep-RELP   Level 4   Watts 35   Minutes 20   Home Exercise Plan   Plans to continue exercise at [facility]      Nutrition:  Target Goals: Understanding of nutrition guidelines, daily intake of sodium <1548m, cholesterol <2033m calories 30% from fat and 7% or less from saturated fats, daily to have 5 or more servings of fruits and vegetables.  Biometrics:     Pre Biometrics - 01/21/15 1335    Pre Biometrics   Height 5' 9.4" (1.763 m)   Weight 184 lb 9.6 oz (83.734 kg)   Waist Circumference 39.75 inches   Hip Circumference 40 inches   Waist to Hip Ratio 0.99 %   BMI (Calculated) 27       Nutrition Therapy Plan and Nutrition Goals:     Nutrition Therapy & Goals - 03/27/15 1649    Personal Nutrition Goals   Personal Goal #1 JiLaverna Peaceaid his nutrition is doing well and that he is maintaining a heart healthy diet      Nutrition Discharge: Rate Your Plate Scores:     Rate Your Plate - 0144/01/0287253  Rate Your Plate Scores   Post Score 71   Post Score % 78.8 %      Nutrition Goals Re-Evaluation:     Nutrition Goals Re-Evaluation      02/21/15 1645 04/01/15 1449         Personal Goal #1 Re-Evaluation   Personal Goal #1 JiLaverna Peacerefers not to meet individually with the registered dietician. "I know what I need to eat".        Goal  Progress Seen  Yes         Psychosocial: Target Goals: Acknowledge presence or absence of depression, maximize coping skills,  provide positive support system. Participant is able to verbalize types and ability to use techniques and skills needed for reducing stress and depression.  Initial Review & Psychosocial Screening:     Initial Psych Review & Screening - 01/21/15 1458    Initial Review   Current issues with Current Stress Concerns   Source of Stress Concerns Retirement/disability   Comments Darius Miller reported he was giving his wife info to relay when he was in the hospital since they were selling their house in [place] to move closer to [place] and where he grew up in [place], [place]. He had to sell his mother's dining room set since moved from a 3 bedroom home to a 2 bedroom condo since he didn't feel he could keep up with the yeardwork etc. 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    Family Dynamics   Good Support System? Yes   Barriers   Psychosocial barriers to participate in program The patient should benefit from training in stress management and relaxation.   Screening Interventions   Interventions Encouraged to exercise      Quality of Life Scores:     Quality of Life - 04/01/15 1823    Quality of Life Scores   Health/Function Post 23.8 %   Health/Function % Change 31 %   Socioeconomic Post 27.33 %   Socioeconomic % Change 8 %   Psych/Spiritual Post 28.93 %   Psych/Spiritual % Change 5 %   Family Post 30 %   Family % Change 35 %   GLOBAL Post 26.47 %   GLOBAL % Change 19 %      PHQ-9:     Recent Review Flowsheet Data    Depression screen [facility] 2/9 04/01/2015 01/21/2015   Decreased Interest 0 0   Down, Depressed, Hopeless 0 0   PHQ - 2 Score 0 0   Altered sleeping 1 0   Tired, decreased energy 1 3   Change in appetite 0 0   Feeling bad or failure about yourself  0 0   Trouble concentrating 0 0   Moving slowly or fidgety/restless 0 0   Suicidal thoughts 0 0   PHQ-9 Score 2 3   Difficult doing work/chores Not difficult at all Somewhat difficult       Psychosocial Evaluation and Intervention:     Psychosocial Evaluation - 01/30/15 1639    Psychosocial Evaluation & Interventions   Interventions Stress management education;Relaxation education;Encouraged to exercise with the program and follow exercise prescription   Comments Counselor met with Darius Miller today for initial psychosocial evaluation.  He is a well-adjusted 70 year old who had cardiac surgery in August.  He has a strong support system with a fiance and adult children/grandchildren who live close by.  Darius Miller reports having kidney stones and some pulmonary issues as well as his cardiac illness.  He states that he sleeps well for the most part and has a good appetite.  He denies a history of depression or anxiety or current symptoms.  He states he is typically in a good mood and loves to be on the golf course.  Current stress in [place] life [place]  His goals for this program are to lose weight, to increase his stamina and strength and  increase his energy level.  Counselor will follow with Darius Miller as needed.    Continued Psychosocial Services Needed Yes  Darius Miller will benefit from the psychoeducational components of this program, especially stress management and relaxation due to his recent move.  He will also benefit from meeting with the dietician for weight loss goal to be addressed.      Psychosocial Re-Evaluation:     Psychosocial Re-Evaluation      02/21/15 1645 03/21/15 1650 03/27/15 1628       Psychosocial Re-Evaluation   Interventions Encouraged to attend Cardiac Rehabilitation for the exercise       Comments Darius Miller usually states when he attends Cardiac Rehab that he is ready to "get out of here " but he stays the whole time. Darius Miller stated he felt better today and did some honey do list things like hang a shelf. Darius Miller is ready to graduate. He is willing to do his post paperwork Depression score.         Vocational  Rehabilitation: Provide vocational rehab assistance to qualifying candidates.   Vocational Rehab Evaluation & Intervention:     Vocational Rehab - 01/21/15 1405    Initial Vocational Rehab Evaluation & Intervention   Assessment shows need for Vocational Rehabilitation (p) No      Education: Education Goals: Education classes will be provided on a weekly basis, covering required topics. Participant will state understanding/return demonstration of topics presented.  Learning Barriers/Preferences:     Learning Barriers/Preferences - 01/21/15 1455    Learning Barriers/Preferences   Learning Barriers None   Learning Preferences None      Education Topics: General Nutrition Guidelines/Fats and Fiber: -Group instruction provided by verbal, written material, models and posters to present the general guidelines for heart healthy nutrition. Gives an explanation and review of dietary fats and fiber.          Cardiac Rehab from 03/25/2015 in [facility] Cardiac and Pulmonary Rehab   Date  02/11/15   Educator  PI   Instruction Review Code  2- meets goals/outcomes      Controlling Sodium/Reading Food Labels: -Group verbal and written material supporting the discussion of sodium use in heart healthy nutrition. Review and explanation with models, verbal and written materials for utilization of the food label.      Cardiac Rehab from 03/25/2015 in [facility] Cardiac and Pulmonary Rehab   Date  02/18/15   Educator  PI   Instruction Review Code  2- meets goals/outcomes      Exercise Physiology & Risk Factors: - Group verbal and written instruction with models to review the exercise physiology of the cardiovascular system and associated critical values. Details cardiovascular disease risk factors and the goals associated with each risk factor.      Cardiac Rehab from 03/25/2015 in [facility] Cardiac and Pulmonary Rehab   Date  03/13/15   Educator  [place]   Instruction Review Code  2- meets  goals/outcomes      Aerobic Exercise & Resistance Training: - Gives group verbal and written discussion on the health impact of inactivity. On the components of aerobic and resistive training programs and the benefits of this training and how to safely progress through these programs.      Cardiac Rehab from 03/25/2015 in [facility] Cardiac and Pulmonary Rehab   Date  03/25/15   Educator  RM   Instruction Review Code  2- meets goals/outcomes      Flexibility, Balance, General Exercise Guidelines: - Provides group verbal and  written instruction on the benefits of flexibility and balance training programs. Provides general exercise guidelines with specific guidelines to those with heart or lung disease. Demonstration and skill practice provided.      Cardiac Rehab from 03/25/2015 in [facility] Cardiac and Pulmonary Rehab   Date  03/25/15   Educator  RM   Instruction Review Code  2- meets goals/outcomes      Stress Management: - Provides group verbal and written instruction about the health risks of elevated stress, cause of high stress, and healthy ways to reduce stress.      Cardiac Rehab from 03/25/2015 in [facility] Cardiac and Pulmonary Rehab   Date  03/20/15   Educator  Kathreen Cornfield   Instruction Review Code  2- meets goals/outcomes      Depression: - Provides group verbal and written instruction on the correlation between heart/lung disease and depressed mood, treatment options, and the stigmas associated with seeking treatment.      Cardiac Rehab from 03/25/2015 in [facility] Cardiac and Pulmonary Rehab   Date  02/20/15   Educator  Juliann Pulse C   Instruction Review Code  2- meets goals/outcomes      Anatomy & Physiology of the Heart: - Group verbal and written instruction and models provide basic cardiac anatomy and physiology, with the coronary electrical and arterial systems. Review of: AMI, Angina, Valve disease, Heart Failure, Cardiac Arrhythmia, Pacemakers, and the ICD.      Cardiac Rehab from  03/25/2015 in [facility] Cardiac and Pulmonary Rehab   Date  01/28/15   Educator  S. Bice, RN   Instruction Review Code  2- meets goals/outcomes      Cardiac Procedures: - Group verbal and written instruction and models to describe the testing methods done to diagnose heart disease. Reviews the outcomes of the test results. Describes the treatment choices: Medical Management, Angioplasty, or Coronary Bypass Surgery.      Cardiac Rehab from 03/25/2015 in [facility] Cardiac and Pulmonary Rehab   Date  02/25/15   Educator  SB   Instruction Review Code  2- meets goals/outcomes      Cardiac Medications: - Group verbal and written instruction to review commonly prescribed medications for heart disease. Reviews the medication, class of the drug, and side effects. Includes the steps to properly store meds and maintain the prescription regimen.      Cardiac Rehab from 03/25/2015 in [facility] Cardiac and Pulmonary Rehab   Date  03/06/15   Educator  DW   Instruction Review Code  2- meets goals/outcomes      Go Sex-Intimacy & Heart Disease, Get SMART - Goal Setting: - Group verbal and written instruction through game format to discuss heart disease and the return to sexual intimacy. Provides group verbal and written material to discuss and apply goal setting through the application of the S.M.A.R.T. Method.      Cardiac Rehab from 03/25/2015 in [facility] Cardiac and Pulmonary Rehab   Date  02/25/15   Educator  SB   Instruction Review Code  2- meets goals/outcomes      Other Matters of the Heart: - Provides group verbal, written materials and models to describe Heart Failure, Angina, Valve Disease, and Diabetes in the realm of heart disease. Includes description of the disease process and treatment options available to the cardiac patient.      Cardiac Rehab from 03/25/2015 in [facility] Cardiac and Pulmonary Rehab   Date  02/06/15   Educator  DW   Instruction Review Code  2- meets  goals/outcomes      Exercise &  Equipment Safety: - Individual verbal instruction and demonstration of equipment use and safety with use of the equipment.      Cardiac Rehab from 03/25/2015 in [facility] Cardiac and Pulmonary Rehab   Date  01/21/15   Educator  C. Noela Brothers,.RN   Instruction Review Code  2- meets goals/outcomes      Infection Prevention: - Provides verbal and written material to individual with discussion of infection control including proper hand washing and proper equipment cleaning during exercise session.      Cardiac Rehab from 03/25/2015 in [facility] Cardiac and Pulmonary Rehab   Date  01/21/15   Educator  C. Harace Mccluney,RN   Instruction Review Code  2- meets goals/outcomes      Falls Prevention: - Provides verbal and written material to individual with discussion of falls prevention and safety.      Cardiac Rehab from 03/25/2015 in [facility] Cardiac and Pulmonary Rehab   Date  01/21/15   Educator  C. [place]   Instruction Review Code  2- meets goals/outcomes      Diabetes: - Individual verbal and written instruction to review signs/symptoms of diabetes, desired ranges of glucose level fasting, after meals and with exercise. Advice that pre and post exercise glucose checks will be done for 3 sessions at entry of program.    Knowledge Questionnaire Score:     Knowledge Questionnaire Score - 04/01/15 1822    Knowledge Questionnaire Score   Post Score 23      Personal Goals and Risk Factors at Admission:   Personal Goals and Risk Factors Review:      Goals and Risk Factor Review      01/30/15 1606 01/30/15 1628 02/21/15 1642 03/20/15 1810 04/01/15 1449   Weight Management   Goals Progress/Improvement seen Yes  No No    Comments Patient's initial weight was 184.6.  Past three sessions weight down to 183.3.  Today patient has a thick sweat shirt and weight is 185.  Patient is walking his little dogs at home several times a day.  Patient still wants to lose a few pounds.  He plans to play golf on the  day after Thanksgiving.     Patient's weight today was 186.  Patient states he has been eating way to much recently due to the holiday season.   Patient does want to lose a couple more pounds, as he feels most comfortable around 184.      Increase Aerobic Exercise and Physical Activity   Goals Progress/Improvement seen  Yes  Yes Yes    Comments Patient states he feels the biggest difference when he goes to bed at night.  He states he is not as short of breath at night.    Darius Miller left knee hurts 5/10 and right hip is 5/10 on Biostep after using the treadmill today. Patient states he feels better since he started exercising in the Cardiac Rehab program. Darius Miller stated, "I feel better and I am less short of breath."  Patient stated he can now bend over and tie his shoe without getting short of breath when before that was not the case.      Take Less Medication   Goals Progress/Improvement seen Met  Yes Met    Comments  MD discontinued Digoxin and patient stopped taking Digoxin on November 22nd, 2016.    MD cut his one pill in 1/4. blood pressure today was 108/68. Patient stated he is no longer on digoxin,  as the MD stopped that med.      Hypertension   Progress seen toward goals  Yes Yes Yes Yes   Comments  BP is well controlled.  BP 110 - 124 / 60 - 70.   108/68.  BP at check-in today 104/60 and exit BP 102/60.  BP while walking on TM 116/64.      Abnormal Lipids   Progress seen towards goals  Unknown Unknown Unknown Unknown   Comments  no new labs since starting Cardiac Rehab.    No new labs since starting Cardiac Rehab.  Patient stated he went to doctor to have his blood drawn this past week, but staff were unable to draw blood.  Patient states he needs to go back to have blood drawn.         Personal Goals Discharge (Final Personal Goals and Risk Factors Review):      Goals and Risk Factor Review - 04/01/15 1449    Hypertension   Progress seen toward goals Yes   Abnormal Lipids   Progress seen  towards goals Unknown      ITP Comments:     ITP Comments      02/12/15 1310 03/10/15 1422         ITP Comments 30 day review preparation  Continue with ITP Ready for 30 day review.  Continue with ITP         Comments: Didn't sleep at all last night so did not stay to even hear education today.

## 2015-04-04 DIAGNOSIS — Z9889 Other specified postprocedural states: Secondary | ICD-10-CM

## 2015-04-04 NOTE — Progress Notes (Signed)
Daily Session Note  Patient Details  Name: Desman Polak MRN: [id number] Date of Birth: 1945/08/01 Referring Provider:  Yolonda Kida, MD  Encounter Date: 04/04/2015  Check In:     Session Check In - 04/04/15 1606    Check-In   Staff Present Gerlene Burdock, RN, BSN;Diane Joya Gaskins, RN, BSN;Adel Burch, BS, ACSM EP-C, Exercise Physiologist   ER physicians immediately available to respond to emergencies See telemetry face sheet for immediately available ER MD   Medication changes reported     No   Fall or balance concerns reported    No   Warm-up and Cool-down Performed on first and last piece of equipment   VAD Patient? No   Pain Assessment   Currently in Pain? No/denies         Goals Met:  Proper associated with RPD/PD & O2 Sat Exercise tolerated well No report of cardiac concerns or symptoms Strength training completed today  Goals Unmet:  Not Applicable  Goals Comments:    Dr. Emily Filbert is Medical Director for [facility] and [facility].

## 2015-04-05 NOTE — Progress Notes (Signed)
Cardiac Individual Treatment Plan  Patient Details  Name: Darius Miller MRN: [id number] Date of Birth: 02-22-1946 Referring Provider:  Yolonda Kida, MD  Initial Encounter Date:    Visit Diagnosis: S/P mitral valve repair  Patient's Home Medications on Admission:  Current outpatient prescriptions:  .  aspirin 81 MG tablet, Take 81 mg by mouth daily., Disp: , Rfl:  .  atorvastatin (LIPITOR) 40 MG tablet, Take 40 mg by mouth daily., Disp: , Rfl:  .  dabigatran (PRADAXA) 150 MG CAPS capsule, Take 150 mg by mouth 2 (two) times daily., Disp: , Rfl:  .  digoxin (LANOXIN) 0.125 MG tablet, Take 0.25 mg by mouth daily., Disp: , Rfl:  .  furosemide (LASIX) 40 MG tablet, Take 40 mg by mouth., Disp: , Rfl:  .  lisinopril (PRINIVIL,ZESTRIL) 2.5 MG tablet, Take 2.5 mg by mouth daily., Disp: , Rfl:  .  metoprolol succinate (TOPROL-XL) 25 MG 24 hr tablet, Take 12.5 mg by mouth daily., Disp: , Rfl:   Past Medical History: Past Medical History  Diagnosis Date  . Coronary artery disease     Tobacco Use: History  Smoking status  . Former Smoker -- 2.00 packs/day for 20 years  . Types: Cigarettes  Smokeless tobacco  . Not on file    Labs: Recent Review Flowsheet Data    There is no flowsheet data to display.       Exercise Target Goals:    Exercise Program Goal: Individual exercise prescription set with THRR, safety & activity barriers. Participant demonstrates ability to understand and report RPE using BORG scale, to self-measure pulse accurately, and to acknowledge the importance of the exercise prescription.  Exercise Prescription Goal: Starting with aerobic activity 30 plus minutes a day, 3 days per week for initial exercise prescription. Provide home exercise prescription and guidelines that participant acknowledges understanding prior to discharge.  Activity Barriers & Risk Stratification:     Activity Barriers & Risk Stratification - 01/21/15 1454    Activity Barriers  & Risk Stratification   Activity Barriers Arthritis   Risk Stratification High      6 Minute Walk:     6 Minute Walk      01/21/15 1342 03/27/15 1632     6 Minute Walk   Phase Initial Discharge    Distance 1185 feet 1260 feet    Distance % Change  6 %    Walk Time 6 minutes 6 minutes    Resting HR 43 bpm 81 bpm    Resting BP 110/60 mmHg 126/66 mmHg    Max Ex. HR 84 bpm 117 bpm    Max Ex. BP 114/62 mmHg 130/70 mmHg    RPE 11 11    Symptoms No No       Initial Exercise Prescription:     Initial Exercise Prescription - 01/21/15 1300    Date of Initial Exercise Prescription   Date 01/21/15   Treadmill   MPH 2   Grade 0   Minutes 10   Bike   Level 0.4   Watts 15   Minutes 10   Recumbant Bike   Level 3   RPM 40   Watts 25   Minutes 15   NuStep   Level 3   Watts 25   Minutes 15   Arm Ergometer   Level 1   Watts 8   Minutes 10   Arm/Foot Ergometer   Level 4   Watts 15   Minutes 10  Cybex   Level 2   RPM 50   Minutes 10   Recumbant Elliptical   Level 1   RPM 40   Watts 10   Minutes 10   Elliptical   Level 1   Speed 3   Minutes 1   REL-XR   Level 2   Watts 35   Minutes 10   Prescription Details   Frequency (times per week) 3   Duration Progress to 30 minutes of continuous aerobic without signs/symptoms of physical distress   Intensity   THRR REST +  20   Ratings of Perceived Exertion 11-15   Progression Continue progressive overload as per policy without signs/symptoms or physical distress.   Resistance Training   Training Prescription Yes   Weight 2   Reps 10-15      Exercise Prescription Changes:     Exercise Prescription Changes      01/23/15 1600 02/04/15 1437 02/13/15 1600 02/21/15 1421 02/27/15 1455   Exercise Review   Progression  Yes Yes  Yes   Response to Exercise   Blood Pressure (Admit)  108/74 mmHg   104/70 mmHg   Blood Pressure (Exercise)  134/64 mmHg   108/68 mmHg   Blood Pressure (Exit)  110/70 mmHg   100/60 mmHg    Heart Rate (Admit)  86 bpm   93 bpm   Heart Rate (Exercise)  105 bpm   125 bpm   Heart Rate (Exit)  88 bpm   88 bpm   Rating of Perceived Exertion (Exercise)  13   12   Symptoms None None None  None   Comments First day of exercise! Patient was oriented to the gym and the equipment functions and settings. Procedures and policies of the gym were outlined and explained. The patient's individual exercise prescription and treatment plan were reviewed with them. All starting workloads were established based on the results of the functional testing  done at the initial intake visit. The plan for exercise progression was also introduced and progression will be customized based on the patient's performance and goals.  Reviewed individualized exercise prescription and made increases per departmental policy. Exercise increases were discussed with the patient and they were able to perform the new work loads without issue (no signs or symptoms). Reviewed individualized exercise prescription and made increases per departmental policy. Exercise increases were discussed with the patient and they were able to perform the new work loads without issue (no signs or symptoms).  [name] can continuously exercise for the entire class and his exercise progression will now focus on intensity. We met with him and discussed interval training and he has been incorporating it into his exercise routine on the treadmill in order to progressively overload his system and allow for continual improvement. We met with him and requested that he add 1d/wk of home exercise into his routine and we will follow up with him on what he is going to do.    Frequency     Add 1 additional day to program exercise sessions.   Duration Progress to 30 minutes of continuous aerobic without signs/symptoms of physical distress Progress to 50 minutes of aerobic without signs/symptoms of physical distress Progress to 50 minutes of aerobic without signs/symptoms  of physical distress  Progress to 50 minutes of aerobic without signs/symptoms of physical distress   Intensity Other (comment)  Rest +20 Rest + 30  Rest +20 Rest + 30  Rest +20  Rest + 30   Progression Continue  progressive overload as per policy without signs/symptoms or physical distress. Continue progressive overload as per policy without signs/symptoms or physical distress. Continue progressive overload as per policy without signs/symptoms or physical distress.  Continue progressive overload as per policy without signs/symptoms or physical distress.   Resistance Training   Training Prescription Yes Yes Yes  Yes   Weight '2 2 2  3   '$ Reps 10-15 10-15 10-15  10-15   Interval Training   Interval Training No No Yes  Yes   Equipment   Treadmill  Treadmill   Comments   Start interval training 2.5 to 3 mph using 1 minute burst.    2.5 to 3 mph using 1 minute burst.     Treadmill   MPH  2.5 2.5 2.5 3   Grade  0 0 0 0   Minutes  '15 15 15 15   '$ REL-XR   Level  '4 4 4 4   '$ Watts  65 65 65 65   Minutes  '20 20 20 20   '$ Biostep-RELP   Level     4   Watts     25   Minutes     20     03/25/15 0651 03/27/15 1600         Exercise Review   Progression Yes Yes      Response to Exercise   Blood Pressure (Admit) 104/60 mmHg       Blood Pressure (Exercise) 132/76 mmHg       Blood Pressure (Exit) 122/62 mmHg       Heart Rate (Admit) 67 bpm       Heart Rate (Exercise) 108 bpm       Heart Rate (Exit) 60 bpm       Rating of Perceived Exertion (Exercise) 12       Symptoms None None      Comments Jimmy's progressing is slowing slightly due to him being at close to peak workloads for him. He is walking well on the TM and has gained strength in his legs from the walking and stepping machines. He continues to walk at home with his dogs and has great attendance in  class. Patient is approaching graduation of the program and home exercise plans were discussed. Details of the patient's exercise prescription and  what they need to do in order to continue the prescription and progress with exercise were outlined and the patient verbalized understanding. The patient plans to complete all exercise at the fitness center at [place] 2 additional days to program exercise sessions. Add 2 additional days to program exercise sessions.      Duration Progress to 50 minutes of aerobic without signs/symptoms of physical distress Progress to 50 minutes of aerobic without signs/symptoms of physical distress      Intensity Rest + 30 Rest + 30      Progression Continue progressive overload as per policy without signs/symptoms or physical distress. Continue progressive overload as per policy without signs/symptoms or physical distress.      Resistance Training   Training Prescription Yes Yes      Weight 3 3      Reps 10-15 10-15      Interval Training   Interval Training Yes Yes      Equipment Treadmill Treadmill      Comments 2.5 to 3 mph using 1 minute burst.   2.5 to 3 mph using 1 minute burst.  Treadmill   MPH 3 3      Grade 0 0      Minutes 15 15      REL-XR   Level 4 4      Watts 65 65      Minutes 20 20      Biostep-RELP   Level 4 4      Watts 35 35      Minutes 20 20      Home Exercise Plan   Plans to continue exercise at  [facility]         Discharge Exercise Prescription (Final Exercise Prescription Changes):     Exercise Prescription Changes - 03/27/15 1600    Exercise Review   Progression Yes   Response to Exercise   Symptoms None   Comments Patient is approaching graduation of the program and home exercise plans were discussed. Details of the patient's exercise prescription and what they need to do in order to continue the prescription and progress with exercise were outlined and the patient verbalized understanding. The patient plans to complete all exercise at the fitness center at [place] 2 additional days to program exercise sessions.    Duration Progress to 50 minutes of aerobic without signs/symptoms of physical distress   Intensity Rest + 30   Progression Continue progressive overload as per policy without signs/symptoms or physical distress.   Resistance Training   Training Prescription Yes   Weight 3   Reps 10-15   Interval Training   Interval Training Yes   Equipment Treadmill   Comments 2.5 to 3 mph using 1 minute burst.     Treadmill   MPH 3   Grade 0   Minutes 15   REL-XR   Level 4   Watts 65   Minutes 20   Biostep-RELP   Level 4   Watts 35   Minutes 20   Home Exercise Plan   Plans to continue exercise at [facility]      Nutrition:  Target Goals: Understanding of nutrition guidelines, daily intake of sodium <1548m, cholesterol <2033m calories 30% from fat and 7% or less from saturated fats, daily to have 5 or more servings of fruits and vegetables.  Biometrics:     Pre Biometrics - 01/21/15 1335    Pre Biometrics   Height 5' 9.4" (1.763 m)   Weight 184 lb 9.6 oz (83.734 kg)   Waist Circumference 39.75 inches   Hip Circumference 40 inches   Waist to Hip Ratio 0.99 %   BMI (Calculated) 27       Nutrition Therapy Plan and Nutrition Goals:     Nutrition Therapy & Goals - 03/27/15 1649    Personal Nutrition Goals   Personal Goal #1 JiLaverna Peaceaid his nutrition is doing well and that he is maintaining a heart healthy diet      Nutrition Discharge: Rate Your Plate Scores:     Rate Your Plate - 0144/01/0287253  Rate Your Plate Scores   Post Score 71   Post Score % 78.8 %      Nutrition Goals Re-Evaluation:     Nutrition Goals Re-Evaluation      02/21/15 1645 04/01/15 1449         Personal Goal #1 Re-Evaluation   Personal Goal #1 JiLaverna Peacerefers not to meet individually with the registered dietician. "I know what I need to eat".        Goal  Progress Seen  Yes         Psychosocial: Target Goals: Acknowledge presence or absence of depression, maximize coping skills,  provide positive support system. Participant is able to verbalize types and ability to use techniques and skills needed for reducing stress and depression.  Initial Review & Psychosocial Screening:     Initial Psych Review & Screening - 01/21/15 1458    Initial Review   Current issues with Current Stress Concerns   Source of Stress Concerns Retirement/disability   Comments Prajwal reported he was giving his wife info to relay when he was in the hospital since they were selling their house in [place] to move closer to [place] and where he grew up in [place], [place]. He had to sell his mother's dining room set since moved from a 3 bedroom home to a 2 bedroom condo since he didn't feel he could keep up with the yeardwork etc. 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    Family Dynamics   Good Support System? Yes   Barriers   Psychosocial barriers to participate in program The patient should benefit from training in stress management and relaxation.   Screening Interventions   Interventions Encouraged to exercise      Quality of Life Scores:     Quality of Life - 04/01/15 1823    Quality of Life Scores   Health/Function Post 23.8 %   Health/Function % Change 31 %   Socioeconomic Post 27.33 %   Socioeconomic % Change 8 %   Psych/Spiritual Post 28.93 %   Psych/Spiritual % Change 5 %   Family Post 30 %   Family % Change 35 %   GLOBAL Post 26.47 %   GLOBAL % Change 19 %      PHQ-9:     Recent Review Flowsheet Data    Depression screen [facility] 2/9 04/01/2015 01/21/2015   Decreased Interest 0 0   Down, Depressed, Hopeless 0 0   PHQ - 2 Score 0 0   Altered sleeping 1 0   Tired, decreased energy 1 3   Change in appetite 0 0   Feeling bad or failure about yourself  0 0   Trouble concentrating 0 0   Moving slowly or fidgety/restless 0 0   Suicidal thoughts 0 0   PHQ-9 Score 2 3   Difficult doing work/chores Not difficult at all Somewhat difficult       Psychosocial Evaluation and Intervention:     Psychosocial Evaluation - 01/30/15 1639    Psychosocial Evaluation & Interventions   Interventions Stress management education;Relaxation education;Encouraged to exercise with the program and follow exercise prescription   Comments Counselor met with Mr. Phifer today for initial psychosocial evaluation.  He is a well-adjusted 70 year old who had cardiac surgery in August.  He has a strong support system with a fiance and adult children/grandchildren who live close by.  Mr. Lisenbee reports having kidney stones and some pulmonary issues as well as his cardiac illness.  He states that he sleeps well for the most part and has a good appetite.  He denies a history of depression or anxiety or current symptoms.  He states he is typically in a good mood and loves to be on the golf course.  Current stress in [place] life are [place]  His goals for this program are to lose weight, to increase his stamina and strength and  increase his energy level.  Counselor will follow with Mr. Sweeden as needed.    Continued Psychosocial Services Needed Yes  Mr. Nunnery will benefit from the psychoeducational components of this program, especially stress management and relaxation due to his recent move.  He will also benefit from meeting with the dietician for weight loss goal to be addressed.      Psychosocial Re-Evaluation:     Psychosocial Re-Evaluation      02/21/15 1645 03/21/15 1650 03/27/15 1628       Psychosocial Re-Evaluation   Interventions Encouraged to attend Cardiac Rehabilitation for the exercise       Comments Moussa usually states when he attends Cardiac Rehab that he is ready to "get out of here " but he stays the whole time. Vegas stated he felt better today and did some honey do list things like hang a shelf. Cedar is ready to graduate. He is willing to do his post paperwork Depression score.         Vocational  Rehabilitation: Provide vocational rehab assistance to qualifying candidates.   Vocational Rehab Evaluation & Intervention:     Vocational Rehab - 01/21/15 1405    Initial Vocational Rehab Evaluation & Intervention   Assessment shows need for Vocational Rehabilitation (p) No      Education: Education Goals: Education classes will be provided on a weekly basis, covering required topics. Participant will state understanding/return demonstration of topics presented.  Learning Barriers/Preferences:     Learning Barriers/Preferences - 01/21/15 1455    Learning Barriers/Preferences   Learning Barriers None   Learning Preferences None      Education Topics: General Nutrition Guidelines/Fats and Fiber: -Group instruction provided by verbal, written material, models and posters to present the general guidelines for heart healthy nutrition. Gives an explanation and review of dietary fats and fiber.          Cardiac Rehab from 03/25/2015 in [facility] Cardiac and Pulmonary Rehab   Date  02/11/15   Educator  PI   Instruction Review Code  2- meets goals/outcomes      Controlling Sodium/Reading Food Labels: -Group verbal and written material supporting the discussion of sodium use in heart healthy nutrition. Review and explanation with models, verbal and written materials for utilization of the food label.      Cardiac Rehab from 03/25/2015 in [facility] Cardiac and Pulmonary Rehab   Date  02/18/15   Educator  PI   Instruction Review Code  2- meets goals/outcomes      Exercise Physiology & Risk Factors: - Group verbal and written instruction with models to review the exercise physiology of the cardiovascular system and associated critical values. Details cardiovascular disease risk factors and the goals associated with each risk factor.      Cardiac Rehab from 03/25/2015 in [facility] Cardiac and Pulmonary Rehab   Date  03/13/15   Educator  [place]   Instruction Review Code  2- meets  goals/outcomes      Aerobic Exercise & Resistance Training: - Gives group verbal and written discussion on the health impact of inactivity. On the components of aerobic and resistive training programs and the benefits of this training and how to safely progress through these programs.      Cardiac Rehab from 03/25/2015 in [facility] Cardiac and Pulmonary Rehab   Date  03/25/15   Educator  RM   Instruction Review Code  2- meets goals/outcomes      Flexibility, Balance, General Exercise Guidelines: - Provides group verbal and  written instruction on the benefits of flexibility and balance training programs. Provides general exercise guidelines with specific guidelines to those with heart or lung disease. Demonstration and skill practice provided.      Cardiac Rehab from 03/25/2015 in [facility] Cardiac and Pulmonary Rehab   Date  03/25/15   Educator  RM   Instruction Review Code  2- meets goals/outcomes      Stress Management: - Provides group verbal and written instruction about the health risks of elevated stress, cause of high stress, and healthy ways to reduce stress.      Cardiac Rehab from 03/25/2015 in [facility] Cardiac and Pulmonary Rehab   Date  03/20/15   Educator  Kathreen Cornfield   Instruction Review Code  2- meets goals/outcomes      Depression: - Provides group verbal and written instruction on the correlation between heart/lung disease and depressed mood, treatment options, and the stigmas associated with seeking treatment.      Cardiac Rehab from 03/25/2015 in [facility] Cardiac and Pulmonary Rehab   Date  02/20/15   Educator  Juliann Pulse C   Instruction Review Code  2- meets goals/outcomes      Anatomy & Physiology of the Heart: - Group verbal and written instruction and models provide basic cardiac anatomy and physiology, with the coronary electrical and arterial systems. Review of: AMI, Angina, Valve disease, Heart Failure, Cardiac Arrhythmia, Pacemakers, and the ICD.      Cardiac Rehab from  03/25/2015 in [facility] Cardiac and Pulmonary Rehab   Date  01/28/15   Educator  S. Bice, RN   Instruction Review Code  2- meets goals/outcomes      Cardiac Procedures: - Group verbal and written instruction and models to describe the testing methods done to diagnose heart disease. Reviews the outcomes of the test results. Describes the treatment choices: Medical Management, Angioplasty, or Coronary Bypass Surgery.      Cardiac Rehab from 03/25/2015 in [facility] Cardiac and Pulmonary Rehab   Date  02/25/15   Educator  SB   Instruction Review Code  2- meets goals/outcomes      Cardiac Medications: - Group verbal and written instruction to review commonly prescribed medications for heart disease. Reviews the medication, class of the drug, and side effects. Includes the steps to properly store meds and maintain the prescription regimen.      Cardiac Rehab from 03/25/2015 in [facility] Cardiac and Pulmonary Rehab   Date  03/06/15   Educator  DW   Instruction Review Code  2- meets goals/outcomes      Go Sex-Intimacy & Heart Disease, Get SMART - Goal Setting: - Group verbal and written instruction through game format to discuss heart disease and the return to sexual intimacy. Provides group verbal and written material to discuss and apply goal setting through the application of the S.M.A.R.T. Method.      Cardiac Rehab from 03/25/2015 in [facility] Cardiac and Pulmonary Rehab   Date  02/25/15   Educator  SB   Instruction Review Code  2- meets goals/outcomes      Other Matters of the Heart: - Provides group verbal, written materials and models to describe Heart Failure, Angina, Valve Disease, and Diabetes in the realm of heart disease. Includes description of the disease process and treatment options available to the cardiac patient.      Cardiac Rehab from 03/25/2015 in [facility] Cardiac and Pulmonary Rehab   Date  02/06/15   Educator  DW   Instruction Review Code  2- meets  goals/outcomes      Exercise &  Equipment Safety: - Individual verbal instruction and demonstration of equipment use and safety with use of the equipment.      Cardiac Rehab from 03/25/2015 in [facility] Cardiac and Pulmonary Rehab   Date  01/21/15   Educator  C. Enterkin,.RN   Instruction Review Code  2- meets goals/outcomes      Infection Prevention: - Provides verbal and written material to individual with discussion of infection control including proper hand washing and proper equipment cleaning during exercise session.      Cardiac Rehab from 03/25/2015 in [facility] Cardiac and Pulmonary Rehab   Date  01/21/15   Educator  C. Enterkin,RN   Instruction Review Code  2- meets goals/outcomes      Falls Prevention: - Provides verbal and written material to individual with discussion of falls prevention and safety.      Cardiac Rehab from 03/25/2015 in [facility] Cardiac and Pulmonary Rehab   Date  01/21/15   Educator  C. Emison   Instruction Review Code  2- meets goals/outcomes      Diabetes: - Individual verbal and written instruction to review signs/symptoms of diabetes, desired ranges of glucose level fasting, after meals and with exercise. Advice that pre and post exercise glucose checks will be done for 3 sessions at entry of program.    Knowledge Questionnaire Score:     Knowledge Questionnaire Score - 04/01/15 1822    Knowledge Questionnaire Score   Post Score 23      Personal Goals and Risk Factors at Admission:   Personal Goals and Risk Factors Review:      Goals and Risk Factor Review      01/30/15 1606 01/30/15 1628 02/21/15 1642 03/20/15 1810 04/01/15 1449   Weight Management   Goals Progress/Improvement seen Yes  No No    Comments Patient's initial weight was 184.6.  Past three sessions weight down to 183.3.  Today patient has a thick sweat shirt and weight is 185.  Patient is walking his little dogs at home several times a day.  Patient still wants to lose a few pounds.  He plans to play golf on the  day after Thanksgiving.     Patient's weight today was 186.  Patient states he has been eating way to much recently due to the holiday season.   Patient does want to lose a couple more pounds, as he feels most comfortable around 184.      Increase Aerobic Exercise and Physical Activity   Goals Progress/Improvement seen  Yes  Yes Yes    Comments Patient states he feels the biggest difference when he goes to bed at night.  He states he is not as short of breath at night.    Nadim left knee hurts 5/10 and right hip is 5/10 on Biostep after using the treadmill today. Patient states he feels better since he started exercising in the Cardiac Rehab program. [name] stated, "I feel better and I am less short of breath."  Patient stated he can now bend over and tie his shoe without getting short of breath when before that was not the case.      Take Less Medication   Goals Progress/Improvement seen Met  Yes Met    Comments  MD discontinued Digoxin and patient stopped taking Digoxin on November 22nd, 2016.    MD cut his one pill in 1/4. blood pressure today was 108/68. Patient stated he is no longer on digoxin,  as the MD stopped that med.      Hypertension   Progress seen toward goals  Yes Yes Yes Yes   Comments  BP is well controlled.  BP 110 - 124 / 60 - 70.   108/68.  BP at check-in today 104/60 and exit BP 102/60.  BP while walking on TM 116/64.      Abnormal Lipids   Progress seen towards goals  Unknown Unknown Unknown Unknown   Comments  no new labs since starting Cardiac Rehab.    No new labs since starting Cardiac Rehab.  Patient stated he went to doctor to have his blood drawn this past week, but staff were unable to draw blood.  Patient states he needs to go back to have blood drawn.         Personal Goals Discharge (Final Personal Goals and Risk Factors Review):      Goals and Risk Factor Review - 04/01/15 1449    Hypertension   Progress seen toward goals Yes   Abnormal Lipids   Progress seen  towards goals Unknown      ITP Comments:     ITP Comments      02/12/15 1310 03/10/15 1422 04/05/15 1254       ITP Comments 30 day review preparation  Continue with ITP Ready for 30 day review.  Continue with ITP Ready for  30 day review.  Continue with ITP.         Comments:

## 2015-04-08 NOTE — Progress Notes (Signed)
Discharge Summary  Patient Details  Name: Darius Miller MRN: [id number] Date of Birth: 04-07-1945 Referring Provider:  Yolonda Kida, MD   Number of Visits:   Reason for Discharge:  Patient reached a stable level of exercise. Patient independent in their exercise.  Smoking History:  History  Smoking status  . Former Smoker -- 2.00 packs/day for 20 years  . Types: Cigarettes  Smokeless tobacco  . Not on file    Diagnosis:  S/P mitral valve repair  ADL UCSD:   Initial Exercise Prescription:     Initial Exercise Prescription - 01/21/15 1300    Date of Initial Exercise Prescription   Date 01/21/15   Treadmill   MPH 2   Grade 0   Minutes 10   Bike   Level 0.4   Watts 15   Minutes 10   Recumbant Bike   Level 3   RPM 40   Watts 25   Minutes 15   NuStep   Level 3   Watts 25   Minutes 15   Arm Ergometer   Level 1   Watts 8   Minutes 10   Arm/Foot Ergometer   Level 4   Watts 15   Minutes 10   Cybex   Level 2   RPM 50   Minutes 10   Recumbant Elliptical   Level 1   RPM 40   Watts 10   Minutes 10   Elliptical   Level 1   Speed 3   Minutes 1   REL-XR   Level 2   Watts 35   Minutes 10   Prescription Details   Frequency (times per week) 3   Duration Progress to 30 minutes of continuous aerobic without signs/symptoms of physical distress   Intensity   THRR REST +  20   Ratings of Perceived Exertion 11-15   Progression Continue progressive overload as per policy without signs/symptoms or physical distress.   Resistance Training   Training Prescription Yes   Weight 2   Reps 10-15      Discharge Exercise Prescription (Final Exercise Prescription Changes):     Exercise Prescription Changes - 03/27/15 1600    Exercise Review   Progression Yes   Response to Exercise   Symptoms None   Comments Patient is approaching graduation of the program and home exercise plans were discussed. Details of the patient's exercise prescription and what  they need to do in order to continue the prescription and progress with exercise were outlined and the patient verbalized understanding. The patient plans to complete all exercise at the fitness center at [place] 2 additional days to program exercise sessions.   Duration Progress to 50 minutes of aerobic without signs/symptoms of physical distress   Intensity Rest + 30   Progression Continue progressive overload as per policy without signs/symptoms or physical distress.   Resistance Training   Training Prescription Yes   Weight 3   Reps 10-15   Interval Training   Interval Training Yes   Equipment Treadmill   Comments 2.5 to 3 mph using 1 minute burst.     Treadmill   MPH 3   Grade 0   Minutes 15   REL-XR   Level 4   Watts 65   Minutes 20   Biostep-RELP   Level 4   Watts 35   Minutes 20   Home Exercise Plan   Plans to continue exercise at [facility]  Functional Capacity:     6 Minute Walk      01/21/15 1342 03/27/15 1632     6 Minute Walk   Phase Initial Discharge    Distance 1185 feet 1260 feet    Distance % Change  6 %    Walk Time 6 minutes 6 minutes    Resting HR 43 bpm 81 bpm    Resting BP 110/60 mmHg 126/66 mmHg    Max Ex. HR 84 bpm 117 bpm    Max Ex. BP 114/62 mmHg 130/70 mmHg    RPE 11 11    Symptoms No No       Psychological, QOL, Others - Outcomes: PHQ 2/9: Depression screen [facility] 2/9 04/01/2015 01/21/2015  Decreased Interest 0 0  Down, Depressed, Hopeless 0 0  PHQ - 2 Score 0 0  Altered sleeping 1 0  Tired, decreased energy 1 3  Change in appetite 0 0  Feeling bad or failure about yourself  0 0  Trouble concentrating 0 0  Moving slowly or fidgety/restless 0 0  Suicidal thoughts 0 0  PHQ-9 Score 2 3  Difficult doing work/chores Not difficult at all Somewhat difficult    Quality of Life:     Quality of Life - 04/01/15 1823    Quality of Life Scores   Health/Function Post 23.8 %   Health/Function % Change 31 %    Socioeconomic Post 27.33 %   Socioeconomic % Change 8 %   Psych/Spiritual Post 28.93 %   Psych/Spiritual % Change 5 %   Family Post 30 %   Family % Change 35 %   GLOBAL Post 26.47 %   GLOBAL % Change 19 %      Personal Goals: Goals established at orientation with interventions provided to work toward goal.    Personal Goals Discharge:     Goals and Risk Factor Review      01/30/15 1606 01/30/15 1628 02/21/15 1642 03/20/15 1810 04/01/15 1449   Weight Management   Goals Progress/Improvement seen Yes  No No    Comments Patient's initial weight was 184.6.  Past three sessions weight down to 183.3.  Today patient has a thick sweat shirt and weight is 185.  Patient is walking his little dogs at home several times a day.  Patient still wants to lose a few pounds.  He plans to play golf on the day after Thanksgiving.     Patient's weight today was 186.  Patient states he has been eating way to much recently due to the holiday season.   Patient does want to lose a couple more pounds, as he feels most comfortable around 184.      Increase Aerobic Exercise and Physical Activity   Goals Progress/Improvement seen  Yes  Yes Yes    Comments Patient states he feels the biggest difference when he goes to bed at night.  He states he is not as short of breath at night.    Estle left knee hurts 5/10 and right hip is 5/10 on Biostep after using the treadmill today. Patient states he feels better since he started exercising in the Cardiac Rehab program. [name] stated, "I feel better and I am less short of breath."  Patient stated he can now bend over and tie his shoe without getting short of breath when before that was not the case.      Take Less Medication   Goals Progress/Improvement seen Met  Yes Met    Comments  MD discontinued Digoxin and patient stopped taking Digoxin on November 22nd, 2016.    MD cut his one pill in 1/4. blood pressure today was 108/68. Patient stated he is no longer on digoxin, as the  MD stopped that med.      Hypertension   Progress seen toward goals  Yes Yes Yes Yes   Comments  BP is well controlled.  BP 110 - 124 / 60 - 70.   108/68.  BP at check-in today 104/60 and exit BP 102/60.  BP while walking on TM 116/64.      Abnormal Lipids   Progress seen towards goals  Unknown Unknown Unknown Unknown   Comments  no new labs since starting Cardiac Rehab.    No new labs since starting Cardiac Rehab.  Patient stated he went to doctor to have his blood drawn this past week, but staff were unable to draw blood.  Patient states he needs to go back to have blood drawn.         Nutrition & Weight - Outcomes:     Pre Biometrics - 01/21/15 1335    Pre Biometrics   Height 5' 9.4" (1.763 m)   Weight 184 lb 9.6 oz (83.734 kg)   Waist Circumference 39.75 inches   Hip Circumference 40 inches   Waist to Hip Ratio 0.99 %   BMI (Calculated) 27       Nutrition:     Nutrition Therapy & Goals - 03/27/15 1649    Personal Nutrition Goals   Personal Goal #1 [name] said his nutrition is doing well and that he is maintaining a heart healthy diet      Nutrition Discharge:     Rate Your Plate - 84/53/64 6803    Rate Your Plate Scores   Post Score 71   Post Score % 78.8 %      Education Questionnaire Score:     Knowledge Questionnaire Score - 04/01/15 1822    Knowledge Questionnaire Score   Post Score 23      Goals reviewed with patient; copy given to patient.

## 2015-04-10 ENCOUNTER — Encounter: Payer: Medicare HMO | Attending: Internal Medicine

## 2015-04-10 DIAGNOSIS — Z9889 Other specified postprocedural states: Secondary | ICD-10-CM | POA: Insufficient documentation

## 2015-04-10 NOTE — Addendum Note (Signed)
Addended by: Rudy Jew on: 04/10/2015 10:45 AM   Modules accepted: Orders

## 2015-04-11 ENCOUNTER — Encounter: Payer: Medicare HMO | Admitting: *Deleted

## 2015-04-11 VITALS — BP 80/60

## 2015-04-11 DIAGNOSIS — Z9889 Other specified postprocedural states: Secondary | ICD-10-CM | POA: Diagnosis present

## 2015-04-11 NOTE — Progress Notes (Signed)
Discharge Summary  Patient Details  Name: Darius Miller MRN: [id number] Date of Birth: 02-09-1946 Referring Provider:  Alwyn Pea, MD   Number of Visits: 69  Reason for Discharge:  Patient reached a stable level of exercise. Patient independent in their exercise.  Smoking History:  History  Smoking status  . Former Smoker -- 2.00 packs/day for 20 years  . Types: Cigarettes  Smokeless tobacco  . Not on file    Diagnosis:  S/P mitral valve repair - Plan: CARDIAC REHAB 30 DAY REVIEW  ADL UCSD:   Initial Exercise Prescription:     Initial Exercise Prescription - 01/21/15 1300    Date of Initial Exercise Prescription   Date 01/21/15   Treadmill   MPH 2   Grade 0   Minutes 10   Bike   Level 0.4   Watts 15   Minutes 10   Recumbant Bike   Level 3   RPM 40   Watts 25   Minutes 15   NuStep   Level 3   Watts 25   Minutes 15   Arm Ergometer   Level 1   Watts 8   Minutes 10   Arm/Foot Ergometer   Level 4   Watts 15   Minutes 10   Cybex   Level 2   RPM 50   Minutes 10   Recumbant Elliptical   Level 1   RPM 40   Watts 10   Minutes 10   Elliptical   Level 1   Speed 3   Minutes 1   REL-XR   Level 2   Watts 35   Minutes 10   Prescription Details   Frequency (times per week) 3   Duration Progress to 30 minutes of continuous aerobic without signs/symptoms of physical distress   Intensity   THRR REST +  20   Ratings of Perceived Exertion 11-15   Progression Continue progressive overload as per policy without signs/symptoms or physical distress.   Resistance Training   Training Prescription Yes   Weight 2   Reps 10-15      Discharge Exercise Prescription (Final Exercise Prescription Changes):     Exercise Prescription Changes - 03/27/15 1600    Exercise Review   Progression Yes   Response to Exercise   Symptoms None   Comments Patient is approaching graduation of the program and home exercise plans were discussed. Details of the  patient's exercise prescription and what they need to do in order to continue the prescription and progress with exercise were outlined and the patient verbalized understanding. The patient plans to complete all exercise at the fitness center at [facility]   Frequency Add 2 additional days to program exercise sessions.   Duration Progress to 50 minutes of aerobic without signs/symptoms of physical distress   Intensity Rest + 30   Progression Continue progressive overload as per policy without signs/symptoms or physical distress.   Resistance Training   Training Prescription Yes   Weight 3   Reps 10-15   Interval Training   Interval Training Yes   Equipment Treadmill   Comments 2.5 to 3 mph using 1 minute burst.     Treadmill   MPH 3   Grade 0   Minutes 15   REL-XR   Level 4   Watts 65   Minutes 20   Biostep-RELP   Level 4   Watts 35   Minutes 20   Home Exercise Plan   Plans to continue  exercise at [facility]      Functional Capacity:     6 Minute Walk      01/21/15 1342 03/27/15 1632     6 Minute Walk   Phase Initial Discharge    Distance 1185 feet 1260 feet    Distance % Change  6 %    Walk Time 6 minutes 6 minutes    Resting HR 43 bpm 81 bpm    Resting BP 110/60 mmHg 126/66 mmHg    Max Ex. HR 84 bpm 117 bpm    Max Ex. BP 114/62 mmHg 130/70 mmHg    RPE 11 11    Symptoms No No       Psychological, QOL, Others - Outcomes: PHQ 2/9: Depression screen [facility] 2/9 04/01/2015 01/21/2015  Decreased Interest 0 0  Down, Depressed, Hopeless 0 0  PHQ - 2 Score 0 0  Altered sleeping 1 0  Tired, decreased energy 1 3  Change in appetite 0 0  Feeling bad or failure about yourself  0 0  Trouble concentrating 0 0  Moving slowly or fidgety/restless 0 0  Suicidal thoughts 0 0  PHQ-9 Score 2 3  Difficult doing work/chores Not difficult at all Somewhat difficult    Quality of Life:     Quality of Life - 04/01/15 1823    Quality of Life Scores   Health/Function Post  23.8 %   Health/Function % Change 31 %   Socioeconomic Post 27.33 %   Socioeconomic % Change 8 %   Psych/Spiritual Post 28.93 %   Psych/Spiritual % Change 5 %   Family Post 30 %   Family % Change 35 %   GLOBAL Post 26.47 %   GLOBAL % Change 19 %      Personal Goals: Goals established at orientation with interventions provided to work toward goal.    Personal Goals Discharge:     Goals and Risk Factor Review      01/30/15 1606 01/30/15 1628 02/21/15 1642 03/20/15 1810 04/01/15 1449   Weight Management   Goals Progress/Improvement seen Yes  No No    Comments Patient's initial weight was 184.6.  Past three sessions weight down to 183.3.  Today patient has a thick sweat shirt and weight is 185.  Patient is walking his little dogs at home several times a day.  Patient still wants to lose a few pounds.  He plans to play golf on the day after Thanksgiving.     Patient's weight today was 186.  Patient states he has been eating way to much recently due to the holiday season.   Patient does want to lose a couple more pounds, as he feels most comfortable around 184.      Increase Aerobic Exercise and Physical Activity   Goals Progress/Improvement seen  Yes  Yes Yes    Comments Patient states he feels the biggest difference when he goes to bed at night.  He states he is not as short of breath at night.    Broedy left knee hurts 5/10 and right hip is 5/10 on Biostep after using the treadmill today. Patient states he feels better since he started exercising in the Cardiac Rehab program. Chanetta Marshall stated, "I feel better and I am less short of breath."  Patient stated he can now bend over and tie his shoe without getting short of breath when before that was not the case.      Take Less Medication   Goals Progress/Improvement  seen Met  Yes Met    Comments  MD discontinued Digoxin and patient stopped taking Digoxin on November 22nd, 2016.    MD cut his one pill in 1/4. blood pressure today was 108/68. Patient  stated he is no longer on digoxin, as the MD stopped that med.      Hypertension   Progress seen toward goals  Yes Yes Yes Yes   Comments  BP is well controlled.  BP 110 - 124 / 60 - 70.   108/68.  BP at check-in today 104/60 and exit BP 102/60.  BP while walking on TM 116/64.      Abnormal Lipids   Progress seen towards goals  Unknown Unknown Unknown Unknown   Comments  no new labs since starting Cardiac Rehab.    No new labs since starting Cardiac Rehab.  Patient stated he went to doctor to have his blood drawn this past week, but staff were unable to draw blood.  Patient states he needs to go back to have blood drawn.        04/11/15 1654           Weight Management   Goals Progress/Improvement seen Yes       Increase Aerobic Exercise and Physical Activity   Goals Progress/Improvement seen  Yes       Comments [name] is going to exercise in our [facility] gym. He may exercise somewhere that has a pool in the next few months also.        Take Less Medication   Goals Progress/Improvement seen Yes       Hypertension   Progress seen toward goals Yes       Abnormal Lipids   Progress seen towards goals Unknown       Comments [name] will follow up with his MD about his blood work.           Nutrition & Weight - Outcomes:     Pre Biometrics - 01/21/15 1335    Pre Biometrics   Height 5' 9.4" (1.763 m)   Weight 184 lb 9.6 oz (83.734 kg)   Waist Circumference 39.75 inches   Hip Circumference 40 inches   Waist to Hip Ratio 0.99 %   BMI (Calculated) 27       Nutrition:     Nutrition Therapy & Goals - 03/27/15 1649    Personal Nutrition Goals   Personal Goal #1 [name] said his nutrition is doing well and that he is maintaining a heart healthy diet      Nutrition Discharge:     Rate Your Plate - 30/73/54 3014    Rate Your Plate Scores   Post Score 71   Post Score % 78.8 %      Education Questionnaire Score:     Knowledge Questionnaire Score - 04/01/15 1822     Knowledge Questionnaire Score   Post Score 23      Goals reviewed with patient; copy given to patient.

## 2015-04-11 NOTE — Progress Notes (Signed)
Daily Session Note  Patient Details  Name: Cledis Sohn MRN: [id number] Date of Birth: February 28, 1946 Referring Provider:  Yolonda Kida, MD  Encounter Date: 04/11/2015  Check In:     Session Check In - 04/11/15 1652    Check-In   Staff Present Gerlene Burdock, RN, BSN;Diane Joya Gaskins, RN, BSN;Susanne Bice, RN, BSN, [place]   ER physicians immediately available to respond to emergencies See telemetry face sheet for immediately available ER MD   Medication changes reported     No   Fall or balance concerns reported    No  [name] reports he was a little dizzy today after doing the breathing test at [facility] MD office today.         Goals Met:  Proper associated with RPD/PD & O2 Sat  Goals Unmet:  BP  Goals Comments: [name] said he didn't feel well today. His blood pressure after exercise was 80/68 and given 2 glasses of water and blood pressure only came up to 90/60. Given 3 more glasses of water and blood pressure finally up to 102/60.    Dr. Emily Filbert is Medical Director for [place] and [facility].

## 2016-09-21 ENCOUNTER — Encounter: Payer: Self-pay | Admitting: Emergency Medicine

## 2016-09-21 ENCOUNTER — Emergency Department: Payer: Medicare HMO

## 2016-09-21 ENCOUNTER — Emergency Department
Admission: EM | Admit: 2016-09-21 | Discharge: 2016-09-21 | Disposition: A | Discharge status: Home / Self Care | Payer: Medicare HMO | Attending: Emergency Medicine | Admitting: Emergency Medicine

## 2016-09-21 DIAGNOSIS — Z87891 Personal history of nicotine dependence: Secondary | ICD-10-CM | POA: Insufficient documentation

## 2016-09-21 DIAGNOSIS — M542 Cervicalgia: Secondary | ICD-10-CM | POA: Diagnosis not present

## 2016-09-21 DIAGNOSIS — Z79899 Other long term (current) drug therapy: Secondary | ICD-10-CM | POA: Insufficient documentation

## 2016-09-21 DIAGNOSIS — Z7901 Long term (current) use of anticoagulants: Secondary | ICD-10-CM | POA: Diagnosis not present

## 2016-09-21 DIAGNOSIS — Z7982 Long term (current) use of aspirin: Secondary | ICD-10-CM | POA: Insufficient documentation

## 2016-09-21 DIAGNOSIS — R079 Chest pain, unspecified: Secondary | ICD-10-CM | POA: Insufficient documentation

## 2016-09-21 DIAGNOSIS — I2510 Atherosclerotic heart disease of native coronary artery without angina pectoris: Secondary | ICD-10-CM | POA: Diagnosis not present

## 2016-09-21 HISTORY — DX: Cerebral infarction, unspecified: I63.9

## 2016-09-21 LAB — TROPONIN I

## 2016-09-21 LAB — BASIC METABOLIC PANEL
Anion gap: 10 (ref 5–15)
BUN: 16 mg/dL (ref 6–20)
CALCIUM: 9.5 mg/dL (ref 8.9–10.3)
CO2: 29 mmol/L (ref 22–32)
CREATININE: 1.13 mg/dL (ref 0.61–1.24)
Chloride: 103 mmol/L (ref 101–111)
GFR calc Af Amer: >60 mL/min (ref >60)
GFR calc non Af Amer: >60 mL/min (ref >60)
Glucose, Bld: 103 mg/dL — ABNORMAL HIGH (ref 65–99)
Potassium: 3.5 mmol/L (ref 3.5–5.1)
SODIUM: 142 mmol/L (ref 135–145)

## 2016-09-21 LAB — CBC
HCT: 47.2 % (ref 40.0–52.0)
Hemoglobin: 15.7 g/dL (ref 13.0–18.0)
MCH: 30.9 pg (ref 26.0–34.0)
MCHC: 33.2 g/dL (ref 32.0–36.0)
MCV: 92.9 fL (ref 80.0–100.0)
PLATELETS: 198 10*3/uL (ref 150–440)
RBC: 5.08 MIL/uL (ref 4.40–5.90)
RDW: 14.7 % — AB (ref 11.5–14.5)
WBC: 6.5 10*3/uL (ref 3.8–10.6)

## 2016-09-21 NOTE — ED Notes (Signed)
AAOx3.  Skin warm and dry.  NAD 

## 2016-09-21 NOTE — Discharge Instructions (Signed)
Please seek medical attention for any high fevers, chest pain, shortness of breath, change in behavior, persistent vomiting, bloody stool or any other new or concerning symptoms.  

## 2016-09-21 NOTE — ED Triage Notes (Addendum)
Pt reports intermittent left side neck pain and intermittent chest tightness for several weeks. Pt denies severity of symptoms but wife vocally disagrees with pt in triage. Pt reports history of stroke and stents in heart. Pt sent here for evaluation of low BP at [facility]. KC reported 88/52.

## 2016-09-21 NOTE — ED Provider Notes (Signed)
[facility] Emergency Department Provider Note  ____________________________________________   I have reviewed the triage vital signs and the nursing notes.   HISTORY  Chief Complaint Left neck pain  History limited by: Not Limited   HPI Darius Miller is a 70 y.o. male who presents to the emergency department today because of concerns for neck pain. Is located on the left side. It is been present for the past 3 days. It has been constant. He denies any trauma or unusual activity prior to the pain starting. He has not had any increased shortness breath over his baseline with the pain. No fevers or diaphoresis. Patient was able to play golf yesterday with it.    Past Medical History:  Diagnosis Date  . Coronary artery disease   . Stroke [facility])     There are no active problems to display for this patient.   Past Surgical History:  Procedure Laterality Date  . CARDIAC CATHETERIZATION    . CORONARY ANGIOPLASTY    . Coronary stent 2009  2009  . MITRAL VALVE REPAIR  October 16, 2014    Prior to Admission medications   Medication Sig Start Date End Date Taking? Authorizing Provider  aspirin 81 MG tablet Take 81 mg by mouth daily.    [provider]  atorvastatin (LIPITOR) 40 MG tablet Take 40 mg by mouth daily.    [provider]  dabigatran (PRADAXA) 150 MG CAPS capsule Take 150 mg by mouth 2 (two) times daily.    [provider]  digoxin (LANOXIN) 0.125 MG tablet Take 0.25 mg by mouth daily.    [provider]  furosemide (LASIX) 40 MG tablet Take 40 mg by mouth.    [provider]  lisinopril (PRINIVIL,ZESTRIL) 2.5 MG tablet Take 2.5 mg by mouth daily.    [provider]  metoprolol succinate (TOPROL-XL) 25 MG 24 hr tablet Take 12.5 mg by mouth daily.    [provider]    Allergies Patient has no known allergies.  No family history on file.  Social History Social History  Substance  Use Topics  . Smoking status: Former Smoker    Packs/day: 2.00    Years: 20.00    Types: Cigarettes  . Smokeless tobacco: Not on file  . Alcohol use Not on file    Review of Systems Constitutional: No fever/chills Eyes: No visual changes. ENT: Positive for occasional difficulty with hearing.  Cardiovascular: Denies chest pain. Respiratory: Denies shortness of breath. Gastrointestinal: No abdominal pain.  No nausea, no vomiting.  No diarrhea.   Genitourinary: Negative for dysuria. Musculoskeletal: Positive for left neck pain. Skin: Negative for rash. Neurological: Negative for headaches, focal weakness or numbness.  ____________________________________________   PHYSICAL EXAM:  VITAL SIGNS: ED Triage Vitals  Enc Vitals Group     BP 09/21/16 1546 102/65     Pulse Rate 09/21/16 1546 (!) 56     Resp 09/21/16 1546 18     Temp 09/21/16 1546 98.2 F (36.8 C)     Temp Source 09/21/16 1546 Oral     SpO2 09/21/16 1546 94 %     Weight 09/21/16 1547 185 lb (83.9 kg)     Height 09/21/16 1547 5\' 11"  (1.803 m)     Head Circumference --      Peak Flow --      Pain Score 09/21/16 1546 2   Constitutional: Alert and oriented. Well appearing and in no distress. Eyes: Conjunctivae are normal.  ENT   Head: Normocephalic and atraumatic.   Nose: No congestion/rhinnorhea.   Mouth/Throat: Mucous membranes are moist.   Neck: No stridor. Hematological/Lymphatic/Immunilogical: No cervical lymphadenopathy. Cardiovascular: Irregularly irregular rhythm.  No murmurs, rubs, or gallops. Respiratory: Normal respiratory effort without tachypnea nor retractions. Breath sounds are clear and equal bilaterally. No wheezes/rales/rhonchi. Gastrointestinal: Soft and non tender. No rebound. No guarding.  Genitourinary: Deferred Musculoskeletal: Normal range of motion in all extremities. No lower extremity edema. Neurologic:  Normal speech and language. No gross focal neurologic deficits are  appreciated.  Skin:  Skin is warm, dry and intact. No rash noted. Psychiatric: Mood and affect are normal. Speech and behavior are normal. Patient exhibits appropriate insight and judgment.  ____________________________________________    LABS (pertinent positives/negatives)  Labs Reviewed  BASIC METABOLIC PANEL - Abnormal; Notable for the following:       Result Value   Glucose, Bld 103 (*)    All other components within normal limits  CBC - Abnormal; Notable for the following:    RDW 14.7 (*)    All other components within normal limits  TROPONIN I     ____________________________________________   EKG  I, Phineas Semen, attending physician, personally viewed and interpreted this EKG  EKG Time: 1538 Rate: 75 Rhythm: atrial fibrillation Axis: normal Intervals: qtc 439 QRS: narrow ST changes: no st elevation Impression: abnormal ekg ____________________________________________    RADIOLOGY  CXR IMPRESSION:  1. Mild cardiac enlargement.  2. 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       ____________________________________________   PROCEDURES  Procedures  ____________________________________________   INITIAL IMPRESSION / ASSESSMENT AND PLAN / ED COURSE  Pertinent labs & imaging results that were available during my care of the patient were reviewed by me and considered in my medical decision making (see chart for details).  Patient presented to the emergency department today because of concerns for left sided neck pain. Patient's EKG does show known atrial fibrillation. No ST elevation. Troponin was negative. I would expect some elevation of the troponin given the fact that the pain is been constant for the past 2-3 days. This point do think muscle skeletal issue likely. Discussed care and return precautions with patient.  ____________________________________________   FINAL CLINICAL IMPRESSION(S) / ED  DIAGNOSES  Final diagnoses:  Neck pain     Note: This dictation was prepared with Dragon dictation. Any transcriptional errors that result from this process are unintentional     Phineas Semen, MD 09/21/16 1659

## 2017-06-03 ENCOUNTER — Inpatient Hospital Stay: Admit: 2017-06-03 | Payer: Medicare HMO

## 2017-06-03 ENCOUNTER — Inpatient Hospital Stay
Admission: EM | Admit: 2017-06-03 | Discharge: 2017-06-04 | DRG: 310 | Disposition: A | Discharge status: Other Acute Inpatient Hospital | Payer: Medicare HMO | Attending: Specialist | Admitting: Specialist

## 2017-06-03 ENCOUNTER — Emergency Department: Payer: Medicare HMO

## 2017-06-03 DIAGNOSIS — Z87891 Personal history of nicotine dependence: Secondary | ICD-10-CM | POA: Diagnosis not present

## 2017-06-03 DIAGNOSIS — R55 Syncope and collapse: Secondary | ICD-10-CM | POA: Diagnosis present

## 2017-06-03 DIAGNOSIS — K219 Gastro-esophageal reflux disease without esophagitis: Secondary | ICD-10-CM | POA: Diagnosis present

## 2017-06-03 DIAGNOSIS — I472 Ventricular tachycardia, unspecified: Secondary | ICD-10-CM

## 2017-06-03 DIAGNOSIS — N400 Enlarged prostate without lower urinary tract symptoms: Secondary | ICD-10-CM | POA: Diagnosis present

## 2017-06-03 DIAGNOSIS — R748 Abnormal levels of other serum enzymes: Secondary | ICD-10-CM | POA: Diagnosis present

## 2017-06-03 DIAGNOSIS — I10 Essential (primary) hypertension: Secondary | ICD-10-CM | POA: Diagnosis present

## 2017-06-03 DIAGNOSIS — Z7902 Long term (current) use of antithrombotics/antiplatelets: Secondary | ICD-10-CM

## 2017-06-03 DIAGNOSIS — R001 Bradycardia, unspecified: Secondary | ICD-10-CM | POA: Diagnosis not present

## 2017-06-03 DIAGNOSIS — J449 Chronic obstructive pulmonary disease, unspecified: Secondary | ICD-10-CM | POA: Diagnosis present

## 2017-06-03 DIAGNOSIS — Z955 Presence of coronary angioplasty implant and graft: Secondary | ICD-10-CM

## 2017-06-03 DIAGNOSIS — I099 Rheumatic heart disease, unspecified: Secondary | ICD-10-CM | POA: Diagnosis present

## 2017-06-03 DIAGNOSIS — Z952 Presence of prosthetic heart valve: Secondary | ICD-10-CM | POA: Diagnosis not present

## 2017-06-03 DIAGNOSIS — Z8673 Personal history of transient ischemic attack (TIA), and cerebral infarction without residual deficits: Secondary | ICD-10-CM | POA: Diagnosis not present

## 2017-06-03 DIAGNOSIS — I4892 Unspecified atrial flutter: Secondary | ICD-10-CM | POA: Diagnosis present

## 2017-06-03 DIAGNOSIS — I2510 Atherosclerotic heart disease of native coronary artery without angina pectoris: Secondary | ICD-10-CM | POA: Diagnosis present

## 2017-06-03 DIAGNOSIS — I482 Chronic atrial fibrillation: Secondary | ICD-10-CM | POA: Diagnosis present

## 2017-06-03 DIAGNOSIS — Z7982 Long term (current) use of aspirin: Secondary | ICD-10-CM

## 2017-06-03 LAB — MRSA PCR SCREENING: MRSA BY PCR: NEGATIVE

## 2017-06-03 LAB — CBC
HEMATOCRIT: 47.2 % (ref 40.0–52.0)
HEMOGLOBIN: 15.4 g/dL (ref 13.0–18.0)
MCH: 30.6 pg (ref 26.0–34.0)
MCHC: 32.6 g/dL (ref 32.0–36.0)
MCV: 94 fL (ref 80.0–100.0)
Platelets: 214 10*3/uL (ref 150–440)
RBC: 5.02 MIL/uL (ref 4.40–5.90)
RDW: 14.8 % — AB (ref 11.5–14.5)
WBC: 9.2 10*3/uL (ref 3.8–10.6)

## 2017-06-03 LAB — COMPREHENSIVE METABOLIC PANEL
ALK PHOS: 76 U/L (ref 38–126)
ALT: 21 U/L (ref 17–63)
ANION GAP: 12 (ref 5–15)
AST: 34 U/L (ref 15–41)
Albumin: 4 g/dL (ref 3.5–5.0)
BILIRUBIN TOTAL: 1.1 mg/dL (ref 0.3–1.2)
BUN: 14 mg/dL (ref 6–20)
CALCIUM: 9.2 mg/dL (ref 8.9–10.3)
CO2: 27 mmol/L (ref 22–32)
Chloride: 101 mmol/L (ref 101–111)
Creatinine, Ser: 1.06 mg/dL (ref 0.61–1.24)
GFR calc non Af Amer: >60 mL/min (ref >60)
Glucose, Bld: 160 mg/dL — ABNORMAL HIGH (ref 65–99)
POTASSIUM: 3.5 mmol/L (ref 3.5–5.1)
Sodium: 140 mmol/L (ref 135–145)
TOTAL PROTEIN: 7.4 g/dL (ref 6.5–8.1)

## 2017-06-03 LAB — TROPONIN I
TROPONIN I: 0.04 ng/mL — AB (ref <0.03)
Troponin I: <0.03 ng/mL (ref <0.03)

## 2017-06-03 LAB — GLUCOSE, CAPILLARY: GLUCOSE-CAPILLARY: 156 mg/dL — AB (ref 65–99)

## 2017-06-03 LAB — PROTIME-INR
INR: 1.24
PROTHROMBIN TIME: 15.5 s — AB (ref 11.4–15.2)

## 2017-06-03 LAB — TSH: TSH: 0.641 u[IU]/mL (ref 0.350–4.500)

## 2017-06-03 LAB — BRAIN NATRIURETIC PEPTIDE: B NATRIURETIC PEPTIDE 5: 881 pg/mL — AB (ref 0.0–100.0)

## 2017-06-03 MED ORDER — NOREPINEPHRINE 4 MG/250ML-% IV SOLN: 0.0000 ug/min | INTRAVENOUS | Status: DC

## 2017-06-03 MED ORDER — SODIUM CHLORIDE 0.9% FLUSH
3.0000 mL | Freq: Two times a day (BID) | INTRAVENOUS | Status: DC
  Administered 2017-06-04: 3 mL via INTRAVENOUS

## 2017-06-03 MED ORDER — AMIODARONE HCL IN DEXTROSE 360-4.14 MG/200ML-% IV SOLN
30.0000 mg/h | INTRAVENOUS | Status: DC
  Administered 2017-06-03: 30 mg/h via INTRAVENOUS
  Filled 2017-06-03: qty 200

## 2017-06-03 MED ORDER — ONDANSETRON HCL 4 MG/2ML IJ SOLN: 4.0000 mg | Freq: Four times a day (QID) | INTRAMUSCULAR | Status: DC | PRN

## 2017-06-03 MED ORDER — ADULT MULTIVITAMIN W/MINERALS CH
1.0000 | ORAL_TABLET | Freq: Every day | ORAL | Status: DC
  Administered 2017-06-03 – 2017-06-04 (×2): 1 via ORAL
  Filled 2017-06-03 (×2): qty 1

## 2017-06-03 MED ORDER — SODIUM CHLORIDE 0.9% FLUSH: 3.0000 mL | Freq: Two times a day (BID) | INTRAVENOUS | Status: DC

## 2017-06-03 MED ORDER — PANTOPRAZOLE SODIUM 40 MG IV SOLR
40.0000 mg | INTRAVENOUS | Status: DC
  Administered 2017-06-03 – 2017-06-04 (×2): 40 mg via INTRAVENOUS
  Filled 2017-06-03 (×2): qty 40

## 2017-06-03 MED ORDER — METOPROLOL SUCCINATE 12.5 MG HALF TABLET
12.5000 mg | ORAL_TABLET | Freq: Every day | ORAL | Status: DC
  Administered 2017-06-04: 12.5 mg via ORAL
  Filled 2017-06-03: qty 1

## 2017-06-03 MED ORDER — TAMSULOSIN HCL 0.4 MG PO CAPS
0.4000 mg | ORAL_CAPSULE | Freq: Every day | ORAL | Status: DC
  Administered 2017-06-04: 0.4 mg via ORAL
  Filled 2017-06-03: qty 1

## 2017-06-03 MED ORDER — BISACODYL 10 MG RE SUPP: 10.0000 mg | Freq: Every day | RECTAL | Status: DC | PRN

## 2017-06-03 MED ORDER — AMIODARONE LOAD VIA INFUSION
150.0000 mg | Freq: Once | INTRAVENOUS | Status: AC
  Administered 2017-06-03: 150 mg via INTRAVENOUS
  Filled 2017-06-03: qty 83.34

## 2017-06-03 MED ORDER — SODIUM CHLORIDE 0.9 % IV SOLN: 250.0000 mL | INTRAVENOUS | Status: DC | PRN

## 2017-06-03 MED ORDER — ONDANSETRON HCL 4 MG PO TABS: 4.0000 mg | ORAL_TABLET | Freq: Four times a day (QID) | ORAL | Status: DC | PRN

## 2017-06-03 MED ORDER — HYDROCODONE-ACETAMINOPHEN 5-325 MG PO TABS: 1.0000 | ORAL_TABLET | ORAL | Status: DC | PRN

## 2017-06-03 MED ORDER — POLYETHYLENE GLYCOL 3350 17 G PO PACK: 17.0000 g | PACK | Freq: Every day | ORAL | Status: DC | PRN

## 2017-06-03 MED ORDER — SODIUM CHLORIDE 0.9 % IV BOLUS
1000.0000 mL | Freq: Once | INTRAVENOUS | Status: AC
  Administered 2017-06-03: 1000 mL via INTRAVENOUS

## 2017-06-03 MED ORDER — FUROSEMIDE 20 MG PO TABS
40.0000 mg | ORAL_TABLET | Freq: Every day | ORAL | Status: DC
  Administered 2017-06-04: 40 mg via ORAL
  Filled 2017-06-03: qty 2

## 2017-06-03 MED ORDER — ATORVASTATIN CALCIUM 20 MG PO TABS
20.0000 mg | ORAL_TABLET | Freq: Every day | ORAL | Status: DC
  Administered 2017-06-04: 20 mg via ORAL
  Filled 2017-06-03: qty 1

## 2017-06-03 MED ORDER — NITROGLYCERIN 0.4 MG SL SUBL: 0.4000 mg | SUBLINGUAL_TABLET | SUBLINGUAL | Status: DC | PRN

## 2017-06-03 MED ORDER — DOCUSATE SODIUM 100 MG PO CAPS
100.0000 mg | ORAL_CAPSULE | Freq: Two times a day (BID) | ORAL | Status: DC
  Administered 2017-06-04: 100 mg via ORAL
  Filled 2017-06-03 (×2): qty 1

## 2017-06-03 MED ORDER — ACETAMINOPHEN 650 MG RE SUPP: 650.0000 mg | Freq: Four times a day (QID) | RECTAL | Status: DC | PRN

## 2017-06-03 MED ORDER — AMIODARONE HCL IN DEXTROSE 360-4.14 MG/200ML-% IV SOLN
60.0000 mg/h | INTRAVENOUS | Status: AC
  Administered 2017-06-03: 60 mg/h via INTRAVENOUS
  Filled 2017-06-03 (×2): qty 200

## 2017-06-03 MED ORDER — ACETAMINOPHEN 325 MG PO TABS: 650.0000 mg | ORAL_TABLET | Freq: Four times a day (QID) | ORAL | Status: DC | PRN

## 2017-06-03 MED ORDER — DABIGATRAN ETEXILATE MESYLATE 150 MG PO CAPS
150.0000 mg | ORAL_CAPSULE | Freq: Two times a day (BID) | ORAL | Status: DC
  Filled 2017-06-03: qty 1

## 2017-06-03 MED ORDER — SODIUM CHLORIDE 0.9% FLUSH: 3.0000 mL | INTRAVENOUS | Status: DC | PRN

## 2017-06-03 MED ORDER — ASPIRIN EC 81 MG PO TBEC
81.0000 mg | DELAYED_RELEASE_TABLET | Freq: Every day | ORAL | Status: DC
  Administered 2017-06-04: 81 mg via ORAL
  Filled 2017-06-03: qty 1

## 2017-06-03 NOTE — ED Notes (Signed)
Called [facility] transfer center spoke to [place] [phone]

## 2017-06-03 NOTE — ED Notes (Signed)
Pt received 10 mg etomidate at 1239, Pt then received cardioversion 1241 75j, then 100j 1242, 120j 1243. Pt tolerated well.

## 2017-06-03 NOTE — ED Notes (Signed)
This RN Brock Ra received verbal orders to administer: bolus 1000 ml for BP then to reassess BP before amiodarone administered.  Administered 2:02 PM. RN will continue to monitor.

## 2017-06-03 NOTE — ED Triage Notes (Addendum)
Pt presents via [facility] for vtach resp distress. Pt presented with HR of 233 CBG-156edp at bedside. Pt had stent placed 3 mths ago at [facility].

## 2017-06-03 NOTE — Progress Notes (Signed)
Family Meeting Note  Advance Directive:yes  Today a meeting took place with the Patient, spouse, neighbor.  Patient is able to participate   The following clinical team members were present during this meeting:MD  The following were discussed:Patient's diagnosis: Sustained ventricular tachycardia with syncopal episodes with associated collapse, rheumatic heart disease, mitral valve replacement, chronic A. fib/A flutter, restrictive lung disease, coronary artery disease, Patient's progosis: > 12 months and Goals for treatment: Full Code  Additional follow-up to be provided: prn  Time spent during discussion:20 minutes  Darius Sol, MD

## 2017-06-03 NOTE — ED Provider Notes (Signed)
[facility] Emergency Department Provider Note  Time seen: 12:51 PM  I have reviewed the triage vital signs and the nursing notes.   HISTORY  Chief Complaint No chief complaint on file.    HPI Darius Miller is a 72 y.o. male with a past medical history of CVA, CAD, reported valve replacement on anticoagulation who presents to the emergency department not feeling well.  According to the patient he states he was not feeling very well this morning which she reports of lightheadedness, nausea.  Called EMS, EMS found the patient have a heart rate greater than 250 bpm what appeared to be ventricular tachycardia brought the patient via emergency traffic to the emergency department.  They state in route to the hospital several times the patient lost consciousness.  Upon arrival the patient initially awake alert and oriented, states he is just not feeling well which she describes as fatigue and nausea.  During the evaluation patient lost consciousness briefly, but then regained consciousness.  Initially unable to obtain a blood pressure with a very thready pulse.  While the patient was awake he was able to provide a history and review of systems which appear to be accurate.   Past Medical History:  Diagnosis Date  . Coronary artery disease   . Stroke [facility])     There are no active problems to display for this patient.    Prior to Admission medications   Medication Sig Start Date End Date Taking? Authorizing Provider  aspirin 81 MG tablet Take 81 mg by mouth daily.    [provider]  atorvastatin (LIPITOR) 40 MG tablet Take 40 mg by mouth daily.    [provider]  dabigatran (PRADAXA) 150 MG CAPS capsule Take 150 mg by mouth 2 (two) times daily.    [provider]  digoxin (LANOXIN) 0.125 MG tablet Take 0.25 mg by mouth daily.    [provider]  furosemide (LASIX) 40 MG tablet Take 40 mg by mouth.    [provider]   lisinopril (PRINIVIL,ZESTRIL) 2.5 MG tablet Take 2.5 mg by mouth daily.    [provider]  metoprolol succinate (TOPROL-XL) 25 MG 24 hr tablet Take 12.5 mg by mouth daily.    [provider]    No Known Allergies  No family history on file.  Social History Social History   Tobacco Use  . Smoking status: Former Smoker    Packs/day: 2.00    Years: 20.00    Pack years: 40.00    Types: Cigarettes  Substance Use Topics  . Alcohol use: Not on file  . Drug use: Not on file    Review of Systems Constitutional: Negative for fever.  Positive for generalized fatigue/weakness  eyes: Negative for visual complaints ENT: Negative for recent illness/congestion Cardiovascular: Negative for chest pain. Respiratory: Mild SOB Gastrointestinal: Negative for abdominal pain, vomiting and diarrhea.  Nausea for nausea. Genitourinary: Negative for urinary compaints Musculoskeletal: Negative for musculoskeletal complaints Skin: Negative for skin complaints  Neurological: Negative for headache All other ROS negative  ____________________________________________   PHYSICAL EXAM:  VITAL SIGNS: ED Triage Vitals [06/03/17 1230]  Enc Vitals Group     BP (!) 146/114     Pulse Rate (!) 233     Resp      Temp 97.9 F (36.6 C)     Temp Source Oral     SpO2      Weight      Height  Head Circumference      Peak Flow      Pain Score      Pain Loc      Pain Edu?      Excl. in GC?     Constitutional: Alert and oriented.  Pale in appearance.  Intermittently lost consciousness briefly and then would regain consciousness.  While conscious patient was awake alert and oriented able to follow commands and answer questions appropriately. Eyes: Normal exam ENT   Head: Normocephalic and atraumatic.   Mouth/Throat: Mucous membranes are moist. Cardiovascular: Rapid heart rate greater than 200 bpm, weak and thready pulse. Respiratory: Normal respiratory effort without  tachypnea nor retractions. Breath sounds are clear  Gastrointestinal: Soft and nontender. No distention.   Musculoskeletal: Nontender with normal range of motion in all extremities.  Neurologic:  Normal speech and language. No gross focal neurologic deficits Skin:  Skin is warm, dry and intact.  Psychiatric: Mood and affect are normal.   ____________________________________________    EKG  EKG reviewed and interpreted by myself shows wide-complex tachycardia at 244 bpm consistent with ventricular tachycardia.  EKG #2 reviewed and interpreted by myself 12: 43: 22 shows narrow QRS, normal axis largely normal intervals with frequent PVCs.  No obvious ST changes.  EKG #3 reviewed and interpreted by myself 12: 51: 07 shows atrial fibrillation at 67 bpm with a narrow QRS, normal axis, largely normal intervals mild lateral ST depressions, could be result of demand ischemia from prior significant tachycardia.  ____________________________________________    RADIOLOGY  Chest x-ray shows cardiomegaly.  No other acute issues identified.  ____________________________________________   INITIAL IMPRESSION / ASSESSMENT AND PLAN / ED COURSE  Pertinent labs & imaging results that were available during my care of the patient were reviewed by me and considered in my medical decision making (see chart for details).  Patient presents to the emergency department not feeling well, nauseated with generalized weakness, found to be in ventricular tachycardia with extremely high rate.  Differential would include ACS, ventricular tachycardia/arrhythmia, SVT with aberrancy.  Unable to obtain an accurate blood pressure initially.  Very weak thready pulse, heart rate greater than 250 bpm at times as high as 300 bpm.  Patient intermittently losing consciousness decision was made to push 10 mg of etomidate and proceed with synchronized cardioversion.  Attempted synchronized cardioversion once the patient was  unconscious from etomidate at 75 J unsuccessfully.  Increased to 100 J shocked again unsuccessfully, finally increased to 120 J shocked again and successfully broke ventricular tachycardia back to what appears to be an underlying atrial fibrillation rhythm.  Patient's blood pressure improved immediately, pulse became strong.  Started the patient on amiodarone bolus and infusion.  Awaiting lab results.  Will discuss with cardiology once results are known.  Patient will require admission to the hospital.  Labs have resulted largely within normal limits.  Troponin negative.  Patient family requested to transfer.  Discussed with [facility], they have no beds available at [facility] or [facility]  Discussed with her cardiologist, we will admit locally to our ICU.  Patient agreeable to this plan of care.    CRITICAL CARE Performed by: Minna Antis   Total critical care time: 45 minutes  Critical care time was exclusive of separately billable procedures and treating other patients.  Critical care was necessary to treat or prevent imminent or life-threatening deterioration.  Critical care was time spent personally by me on the following activities: development of treatment plan with patient and/or surrogate as well  as nursing, discussions with consultants, evaluation of patient's response to treatment, examination of patient, obtaining history from patient or surrogate, ordering and performing treatments and interventions, ordering and review of laboratory studies, ordering and review of radiographic studies, pulse oximetry and re-evaluation of patient's condition.   ELECTRIC CARDIOVERSION Performed by: Minna Antis Consent:  The procedure was performed in an emergent situation.  Verbal consent was obtained.  Written consent was not obtained. Risks and benefits: risks, benefits and alternatives were discussed Consent given by: patient Patient understanding: patient states understanding of the  procedure being performed Patient consent: the patient's understanding of the procedure matches consent given Required items: required blood products, implants, devices, and special equipment available Patient identity confirmed: verbally with patient and with wristband Patient sedated: yes Sedation type: moderate (conscious) sedation Sedatives: etomidate Analgesia: None Cardioversion basis: emergent Pre-procedure rhythm: Wide-complex ventricular tachycardia Patient position: patient was placed in a supine position Chest area: chest area exposed Electrodes: pads Electrodes placed: anterior-posterior Number of attempts: 3 Attempt 1 mode: synchronous Attempt 1 waveform: biphasic Attempt of shocks (in Joules): 75, then 100, finally 120J Attempt outcome: Narrow Complex Atrial Fibrillation Post-procedure rhythm: Atrial fibrillation Complications: no complications Patient tolerance: Patient tolerated the procedure well with no immediate complications    ____________________________________________   FINAL CLINICAL IMPRESSION(S) / ED DIAGNOSES  Ventricular Tachycardia    Minna Antis, MD 06/03/17 [phone]

## 2017-06-03 NOTE — Consult Note (Signed)
Name: Darius Miller MRN: [id number] DOB: 07-29-1945    ADMISSION DATE:  06/03/2017 CONSULTATION DATE: 06/03/17  REFERRING MD : Dr. 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  He presented to [facility] ER on 03/28 via EMS with c/o generalized weakness, lightheadedness, nausea, and syncopal episodes onset of symptoms this week.  Upon EMS arrival at pts home his heart rate was greater than 250 bpm cardiac rhythm concerning for ventricular tachycardia.  En route to the ER the pt lost consciousness several times.  In the ER the pt was initially awake, alert, and oriented, however he did endorse fatigue and nausea.  During evaluation the pt did lose consciousness briefly, however he woke up spontaneously alert and oriented. During episode ER provider unable to obtain a blood pressure and pulse very thready. EKG revealed wide complex tachycardia, hr 200 bpm requiring cardioversion x3 with conversion to atrial fibrillation with slow ventricular rate (pt received 10 mg of etomidate during cardioversion), pt placed on amiodarone gtt.  Lab results revealed BNP 881, troponin <0.03, and CXR revealed cardiomegaly with calcified pleural plaques.  Cardiology consulted pt requesting transfer to [facility], however at this time no critical care beds available.  Pt subsequently admitted to the stepdown unit by hospitalist team for further workup and treatment.   PAST MEDICAL HISTORY :   has a past medical history of  Coronary artery disease and Stroke (HCC).  has a past surgical history that includes Mitral valve repair (October 16, 2014); Cardiac catheterization; Coronary angioplasty; and Coronary stent 2009 (2009). Prior to Admission medications   Medication Sig Start Date End Date Taking? Authorizing Provider  aspirin 81 MG tablet Take 81 mg by mouth daily.   Yes [provider]  dabigatran (PRADAXA) 150 MG CAPS capsule Take 150 mg by mouth 2 (two) times daily.   Yes [provider]  furosemide (LASIX) 40 MG tablet Take 40 mg by mouth.   Yes [provider]  metoprolol succinate (TOPROL-XL) 25 MG 24 hr tablet Take 12.5 mg by mouth daily.   Yes [provider]  nitroGLYCERIN (NITROSTAT) 0.4 MG SL tablet Place 0.4 mg under the tongue every 5 (five) minutes as needed for chest pain.   Yes [provider]  tamsulosin (FLOMAX) 0.4 MG CAPS capsule Take 0.4 mg by mouth daily.   Yes [provider]   No Known Allergies  FAMILY HISTORY:  family history is not on file. SOCIAL HISTORY:  reports that he has quit smoking. His smoking use included cigarettes. He has a 40.00 pack-year smoking history. He does not have any smokeless tobacco history on file.  REVIEW OF SYSTEMS: Positives in BOLD   Constitutional: Negative for fever, chills, weight loss, malaise/fatigue and diaphoresis.  HENT: Negative for hearing loss, ear pain, nosebleeds, congestion, sore throat, neck pain, tinnitus and ear discharge.   Eyes: Negative for blurred vision, double vision, photophobia, pain, discharge and redness.  Respiratory: Negative for cough, hemoptysis, sputum production, shortness of breath, wheezing and stridor.   Cardiovascular:  Negative for chest pain, palpitations, orthopnea, claudication, leg swelling and PND.  Gastrointestinal: heartburn, nausea, vomiting, abdominal pain, diarrhea, constipation, blood in stool and melena.  Genitourinary: Negative for dysuria, urgency,  frequency, hematuria and flank pain.  Musculoskeletal: Negative for myalgias, back pain, joint pain and falls.  Skin: Negative for itching and rash.  Neurological: dizziness, tingling, tremors, sensory change, speech change, focal weakness, seizures, loss of consciousness, weakness and headaches.  Endo/Heme/Allergies: Negative for environmental allergies and polydipsia. Does not bruise/bleed easily.  SUBJECTIVE:  No complaints at this time.  VITAL SIGNS: Temp:  [97.9 F (36.6 C)-98.2 F (36.8 C)] 98.2 F (36.8 C) (03/28 1843) Pulse Rate:  [29-233] 53 (03/28 1745) Resp:  [0-35] 19 (03/28 1745) BP: (71-146)/(35-114) 139/82 (03/28 1843) SpO2:  [96 %-100 %] 98 % (03/28 1745) Weight:  [85.8 kg (189 lb 2.5 oz)] 85.8 kg (189 lb 2.5 oz) (03/28 1843)  PHYSICAL EXAMINATION: General: well nourished, well developed, NAD  Neuro: alert and oriented, follows commands  HEENT: supple, no JVD  Cardiovascular: irregular irregular, no R/G  Lungs: clear throughout, even, non labored   Abdomen: +BS x4, soft, obese, non tender, non distended Musculoskeletal: normal bulk and tone  Skin: intact no rashes or lesions   Recent Labs  Lab 06/03/17 1238  NA 140  K 3.5  CL 101  CO2 27  BUN 14  CREATININE 1.06  GLUCOSE 160*   Recent Labs  Lab 06/03/17 1238  HGB 15.4  HCT 47.2  WBC 9.2  PLT 214   Dg Chest Portable 1 View  Result Date: 06/03/2017 CLINICAL DATA:  Ventricular tachycardia and respiratory distress EXAM: PORTABLE CHEST 1 VIEW COMPARISON:  Chest radiograph 09/21/2016 FINDINGS: Cardiomegaly with scattered calcified pleural plaques in unchanged distribution from the prior study. No focal airspace consolidation or pulmonary edema. IMPRESSION: Cardiomegaly and unchanged distribution of calcified pleural plaques. Electronically Signed   By: Deatra Robinson M.D.   On: 06/03/2017 13:41    ASSESSMENT / PLAN: Wide Complex Tachycardia, presumed to be ventricular tachycardia s/p cardioversion  x3 Syncope secondary to Acute Sustained Ventricular Tachycardia  Chronic Atrial Fibrillation  Mildly elevated troponin secondary to demand ischemia vs. NSTEMI  Hx: CVA, COPD, Mitral Valve Insufficiency s/p Mitral Valve Replacement, CAD, and GERD P: Supplemental O2 for dyspnea and/or hypoxia  Trend troponin's  Continuous telemetry monitoring  Lipid panel pending  Continue amiodarone gtt Continue cardiac medications Cardiology consulted appreciate input  Trend BMP  Replace electrolytes as indicated  Monitor UOP Continue pradaxa  Trend CBC  Monitor for s/sx of bleeding and transfuse for hgb <8 Urine drug screen pending   Sonda Rumble, AGNP  Pulmonary/Critical Care Pager [phone] (please enter 7 digits) PCCM Consult Pager [phone] (please enter 7 digits)

## 2017-06-03 NOTE — ED Notes (Signed)
This RN attempted to call report. RN will wait on Charge to return call. RN will monitor.

## 2017-06-03 NOTE — ED Notes (Signed)
Pt is alert speaking to EDP with wife at bedside.

## 2017-06-03 NOTE — Consult Note (Signed)
[facility] Cardiology  CARDIOLOGY CONSULT NOTE  Patient ID: Darius Miller MRN: [id number] DOB/AGE: 1945-04-18 72 y.o.  Admit date: 06/03/2017 Referring Physician Salary Primary Physician [facility] Primary Cardiologist [facility] Reason for Consultation wide-complex tachycardia  HPI: 72 year old gentleman referred for wide-complex tachycardia.  The patient reports 2 recent syncopal episodes this week.  He presents to [facility] emergency room with generalized weakness and lightheadedness.  In the emergency room he was noted to have wide-complex tachycardia reportedly at a rate of 200 bpm, requiring cardioversion x3 with conversion to atrial fibrillation with slow ventricular rate, now on amiodarone drip.  The patient has a history of coronary artery disease, status post coronary stent of right coronary artery at new [facility] in 2009.  The patient is also status post recent mitral valve repair at [facility] 10/16/2014.  The patient wished to be transferred to [facility], however no critical care beds were available.  Initial troponin is less than 0.03.  The patient currently takes Pradaxa for stroke prevention, last dose was this morning.  Review of systems complete and found to be negative unless listed above     Past Medical History:  Diagnosis Date  . Coronary artery disease   . Stroke [facility])       (Not in a hospital admission) Social History   Socioeconomic History  . Marital status: Single    Spouse name: Not on file  . Number of children: Not on file  . Years of education: Not on file  . Highest education level: Not on file  Occupational History  . Not on file  Social Needs  . Financial resource strain: Not on file  . Food insecurity:    Worry: Not on file    Inability: Not on file  . Transportation needs:    Medical: Not on file    Non-medical: Not on file  Tobacco Use  . Smoking status: Former Smoker    Packs/day: 2.00    Years: 20.00    Pack years: 40.00    Types: Cigarettes   Substance and Sexual Activity  . Alcohol use: Not on file  . Drug use: Not on file  . Sexual activity: Not on file  Lifestyle  . Physical activity:    Days per week: Not on file    Minutes per session: Not on file  . Stress: Not on file  Relationships  . Social connections:    Talks on phone: Not on file    Gets together: Not on file    Attends religious service: Not on file    Active member of club or organization: Not on file    Attends meetings of clubs or organizations: Not on file    Relationship status: Not on file  . Intimate partner violence:    Fear of current or ex partner: Not on file    Emotionally abused: Not on file    Physically abused: Not on file    Forced sexual activity: Not on file  Other Topics Concern  . Not on file  Social History Narrative  . Not on file    No family history on file.    Review of systems complete and found to be negative unless listed above      PHYSICAL EXAM  General: Well developed, well nourished, in no acute distress HEENT:  Normocephalic and atramatic Neck:  No JVD.  Lungs: Clear bilaterally to auscultation and percussion. Heart: HRRR . Normal S1 and S2 without gallops or murmurs.  Abdomen: Bowel  sounds are positive, abdomen soft and non-tender  Msk:  Back normal, normal gait. Normal strength and tone for age. Extremities: No clubbing, cyanosis or edema.   Neuro: Alert and oriented X 3. Psych:  Good affect, responds appropriately  Labs:   Lab Results  Component Value Date   WBC 9.2 06/03/2017   HGB 15.4 06/03/2017   HCT 47.2 06/03/2017   MCV 94.0 06/03/2017   PLT 214 06/03/2017   Recent Labs  Lab 06/03/17 1238  NA 140  K 3.5  CL 101  CO2 27  BUN 14  CREATININE 1.06  CALCIUM 9.2  PROT 7.4  BILITOT 1.1  ALKPHOS 76  ALT 21  AST 34  GLUCOSE 160*   Lab Results  Component Value Date   TROPONINI <0.03 06/03/2017   No results found for: CHOL No results found for: HDL No results found for:  LDLCALC No results found for: TRIG No results found for: CHOLHDL No results found for: LDLDIRECT    Radiology: Dg Chest Portable 1 View  Result Date: 06/03/2017 CLINICAL DATA:  Ventricular tachycardia and respiratory distress EXAM: PORTABLE CHEST 1 VIEW COMPARISON:  Chest radiograph 09/21/2016 FINDINGS: Cardiomegaly with scattered calcified pleural plaques in unchanged distribution from the prior study. No focal airspace consolidation or pulmonary edema. IMPRESSION: Cardiomegaly and unchanged distribution of calcified pleural plaques. Electronically Signed   By: Deatra Robinson M.D.   On: 06/03/2017 13:41    EKG: Atrial fibrillation  ASSESSMENT AND PLAN:   1.  Wide-complex tachycardia, presumed to be ventricular tachycardia, requiring cardioversion x3, on amiodarone drip 2.  Chronic atrial fibrillation, currently in atrial fibrillation with slow ventricular rate, on amiodarone drip, and Pradaxa for stroke prevention 3.  Known coronary artery disease, status post coronary stent RCA 2009, currently without chest pain, with negative troponin  Recommendations  1.  Agree with current therapy 2.  Continue amiodarone drip for now 3.  Cycle cardiac enzymes 4.  Strongly consider cardiac catheterization during this hospitalization to rule out significant underlying coronary artery disease 5.  Hold Pradaxa 6.  2D echocardiogram to evaluate left ventricular function   Signed: Marcina Millard MD,PhD, [facility] 06/03/2017, 5:12 PM

## 2017-06-03 NOTE — H&P (Signed)
[employer] [facility] [place] [facility]   PATIENT NAME: Darius Miller    MR#:  [id number]  DATE OF BIRTH:  29-Dec-1945  DATE OF ADMISSION:  06/03/2017  PRIMARY CARE PHYSICIAN: Gracelyn Nurse, MD   REQUESTING/REFERRING PHYSICIAN:   CHIEF COMPLAINT:  No chief complaint on file.   HISTORY OF PRESENT ILLNESS: Darius Miller  is a 72 y.o. male with a known history per below which also includes A. 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, nephrolithiasis, GERD, presented to the emergency room with dizziness all week, episodes of worsening weakness, lightheadedness, nausea, brought to the emergency room, found to be in acute ventricular tachycardia with heart rate in the 200s-300 range, several episodes of syncope in the emergency room as well as in route to the hospital, patient did receive 10 mg etomidate, cardioversion x3 by ED attending, case discussed with cardiology, patient placed on amiodarone drip, chest x-ray noted for cardiomegaly unchanged calcified pleural plaques, BNP 881, glucose 160, troponin negative, patient evaluated at the bedside, in no apparent distress, resting comfortably in bed, attempts were made to transfer patient back to do-no beds available, per ED attending and discussion with cardiology/Dr.Parashos, admit to telemetry bed, continue amiodarone drip, patient is now been admitted for acute symptomatic sustained ventricular tachycardia with associated syncope  PAST MEDICAL HISTORY:   Past Medical History:  Diagnosis Date  . Coronary artery disease   . Stroke [facility])     PAST SURGICAL HISTORY:  HTN CAD  SOCIAL HISTORY:  Social History   Tobacco Use  . Smoking status: Former Smoker    Packs/day: 2.00    Years: 20.00    Pack years: 40.00    Types: Cigarettes  Substance Use Topics  . Alcohol use: Not on file    FAMILY HISTORY: NKDA  DRUG  ALLERGIES: No Known Allergies  REVIEW OF SYSTEMS:   CONSTITUTIONAL: No fever, +fatigue, weakness.  EYES: No blurred or double vision.  EARS, NOSE, AND THROAT: No tinnitus or ear pain.  RESPIRATORY: No cough, +shortness of breath, no wheezing or hemoptysis.  CARDIOVASCULAR: No chest pain, orthopnea, edema.  GASTROINTESTINAL: + nausea, no vomiting, diarrhea or abdominal pain.  GENITOURINARY: No dysuria, hematuria.  ENDOCRINE: No polyuria, nocturia,  HEMATOLOGY: No anemia, easy bruising or bleeding SKIN: No rash or lesion. MUSCULOSKELETAL: No joint pain or arthritis.   NEUROLOGIC: No tingling, numbness, weakness. + LOC PSYCHIATRY: No anxiety or depression.   MEDICATIONS AT HOME:  Prior to Admission medications   Medication Sig Start Date End Date Taking? Authorizing Provider  aspirin 81 MG tablet Take 81 mg by mouth daily.   Yes [provider]  dabigatran (PRADAXA) 150 MG CAPS capsule Take 150 mg by mouth 2 (two) times daily.   Yes [provider]  furosemide (LASIX) 40 MG tablet Take 40 mg by mouth.   Yes [provider]  metoprolol succinate (TOPROL-XL) 25 MG 24 hr tablet Take 12.5 mg by mouth daily.   Yes [provider]  nitroGLYCERIN (NITROSTAT) 0.4 MG SL tablet Place 0.4 mg under the tongue every 5 (five) minutes as needed for chest pain.   Yes [provider]  tamsulosin (FLOMAX) 0.4 MG CAPS capsule Take 0.4 mg by mouth daily.   Yes [provider]      PHYSICAL EXAMINATION:   VITAL SIGNS: Blood pressure (!) 87/58, pulse 71, temperature 97.9 F (36.6 C), temperature source Oral, resp.  rate 19, SpO2 98 %.  GENERAL:  72 y.o.-year-old patient lying in the bed with no acute distress.  Frail-appearing EYES: Pupils equal, round, reactive to light and accommodation. No scleral icterus. Extraocular muscles intact.  HEENT: Head atraumatic, normocephalic. Oropharynx and nasopharynx clear.  NECK:  Supple, no jugular venous  distention. No thyroid enlargement, no tenderness.  LUNGS: Normal breath sounds bilaterally, no wheezing, rales,rhonchi or crepitation. No use of accessory muscles of respiration.  CARDIOVASCULAR: S1, S2 normal.  Irregular rate and rhythm, no murmurs, rubs, or gallops.  ABDOMEN: Soft, nontender, nondistended. Bowel sounds present. No organomegaly or mass.  EXTREMITIES: No pedal edema, cyanosis, or clubbing.  NEUROLOGIC: Cranial nerves II through XII are intact. MAES. Gait not checked.  PSYCHIATRIC: The patient is alert and oriented x 3.  SKIN: No obvious rash, lesion, or ulcer.   LABORATORY PANEL:   CBC Recent Labs  Lab 06/03/17 1238  WBC 9.2  HGB 15.4  HCT 47.2  PLT 214  MCV 94.0  MCH 30.6  MCHC 32.6  RDW 14.8*   ------------------------------------------------------------------------------------------------------------------  Chemistries  Recent Labs  Lab 06/03/17 1238  NA 140  K 3.5  CL 101  CO2 27  GLUCOSE 160*  BUN 14  CREATININE 1.06  CALCIUM 9.2  AST 34  ALT 21  ALKPHOS 76  BILITOT 1.1   ------------------------------------------------------------------------------------------------------------------ CrCl cannot be calculated (Unknown ideal weight.). ------------------------------------------------------------------------------------------------------------------ No results for input(s): TSH, T4TOTAL, T3FREE, THYROIDAB in the last 72 hours.  Invalid input(s): FREET3   Coagulation profile Recent Labs  Lab 06/03/17 1238  INR 1.24   ------------------------------------------------------------------------------------------------------------------- No results for input(s): DDIMER in the last 72 hours. -------------------------------------------------------------------------------------------------------------------  Cardiac Enzymes Recent Labs  Lab 06/03/17 1238  TROPONINI <0.03    ------------------------------------------------------------------------------------------------------------------ Invalid input(s): POCBNP  ---------------------------------------------------------------------------------------------------------------  Urinalysis No results found for: COLORURINE, APPEARANCEUR, LABSPEC, PHURINE, GLUCOSEU, HGBUR, BILIRUBINUR, KETONESUR, PROTEINUR, UROBILINOGEN, NITRITE, LEUKOCYTESUR   RADIOLOGY: Dg Chest Portable 1 View  Result Date: 06/03/2017 CLINICAL DATA:  Ventricular tachycardia and respiratory distress EXAM: PORTABLE CHEST 1 VIEW COMPARISON:  Chest radiograph 09/21/2016 FINDINGS: Cardiomegaly with scattered calcified pleural plaques in unchanged distribution from the prior study. No focal airspace consolidation or pulmonary edema. IMPRESSION: Cardiomegaly and unchanged distribution of calcified pleural plaques. Electronically Signed   By: Deatra Robinson M.D.   On: 06/03/2017 13:41    EKG: Orders placed or performed during the hospital encounter of 09/21/16  . EKG 12-Lead  . EKG 12-Lead  . ED EKG within 10 minutes  . ED EKG within 10 minutes    IMPRESSION AND PLAN: 1 acute sustained ventricular tachycardia with associated syncope with collapse Status post cardioversion x3 in the emergency room, started on amiodarone drip Admit to telemetry bed, continue amiodarone drip, rule out acute coronary syndrome with cardiac enzymes x3 sets, check magnesium level, supplemental oxygen as needed, consult cardiology for expert opinion - ?  Need for pacer placement, echocardiogram, and continue close medical monitoring  2 chronic A. fib/A flutter Continue aspirin, Pradaxa, Toprol-XL  3 history of mitral valve replacement Done in October 2016, noted history of rheumatic heart disease  4 chronic GERD without esophagitis PPI daily  5 history of cerebrovascular accident Stable Continue aspirin, on Pradaxa, add statin therapy, check lipids in the  morning  6 history of coronary artery disease status post stenting Continue aspirin, on Pradaxa, add statin therapy, check lipids in the morning, nitrates as needed, IV morphine as needed breakthrough pain    All the records are reviewed and case discussed with  ED provider. Management plans discussed with the patient, family and they are in agreement.  CODE STATUS:full    TOTAL TIME TAKING CARE OF THIS PATIENT: .    Evelena Asa Kemani Demarais M.D on 06/03/2017   Between 7am to 6pm - Pager - [phone]  After 6pm go to [email or web address] - password [email or web address]  [facility]  Office  [phone]  CC: Primary care physician; Gracelyn Nurse, MD   Note: This dictation was prepared with Dragon dictation along with smaller phrase technology. Any transcriptional errors that result from this process are unintentional.

## 2017-06-03 NOTE — ED Notes (Signed)
Pt stable awaiting transport to [facility]

## 2017-06-04 ENCOUNTER — Inpatient Hospital Stay
Admit: 2017-06-04 | Discharge: 2017-06-04 | Disposition: A | Discharge status: Home / Self Care | Payer: Medicare HMO | Attending: Family Medicine | Admitting: Family Medicine

## 2017-06-04 DIAGNOSIS — R001 Bradycardia, unspecified: Secondary | ICD-10-CM

## 2017-06-04 LAB — LIPID PANEL
Cholesterol: 216 mg/dL — ABNORMAL HIGH (ref 0–200)
HDL: 24 mg/dL — ABNORMAL LOW (ref >40)
LDL Cholesterol: 166 mg/dL — ABNORMAL HIGH (ref 0–99)
Total CHOL/HDL Ratio: 9 RATIO
Triglycerides: 130 mg/dL (ref <150)
VLDL: 26 mg/dL (ref 0–40)

## 2017-06-04 LAB — URINALYSIS, COMPLETE (UACMP) WITH MICROSCOPIC
Bacteria, UA: NONE SEEN
Bilirubin Urine: NEGATIVE
GLUCOSE, UA: NEGATIVE mg/dL
Ketones, ur: NEGATIVE mg/dL
Leukocytes, UA: NEGATIVE
NITRITE: NEGATIVE
PH: 6 (ref 5.0–8.0)
Protein, ur: NEGATIVE mg/dL
SPECIFIC GRAVITY, URINE: 1.014 (ref 1.005–1.030)

## 2017-06-04 LAB — BASIC METABOLIC PANEL
Anion gap: 8 (ref 5–15)
BUN: 14 mg/dL (ref 6–20)
CALCIUM: 8.9 mg/dL (ref 8.9–10.3)
CO2: 29 mmol/L (ref 22–32)
CREATININE: 1.02 mg/dL (ref 0.61–1.24)
Chloride: 104 mmol/L (ref 101–111)
GFR calc Af Amer: >60 mL/min (ref >60)
Glucose, Bld: 105 mg/dL — ABNORMAL HIGH (ref 65–99)
Potassium: 3.8 mmol/L (ref 3.5–5.1)
Sodium: 141 mmol/L (ref 135–145)

## 2017-06-04 LAB — TROPONIN I
TROPONIN I: 0.04 ng/mL — AB (ref <0.03)
Troponin I: 0.04 ng/mL (ref <0.03)

## 2017-06-04 LAB — MAGNESIUM: MAGNESIUM: 1.7 mg/dL (ref 1.7–2.4)

## 2017-06-04 MED ORDER — FUROSEMIDE 40 MG PO TABS: 40.0000 mg | ORAL_TABLET | Freq: Every day | ORAL | Status: DC

## 2017-06-04 MED ORDER — ASPIRIN 81 MG PO TABS: 81.0000 mg | ORAL_TABLET | Freq: Every day | ORAL | Status: DC

## 2017-06-04 MED ORDER — NITROGLYCERIN 0.4 MG SL SUBL: 0.4000 mg | SUBLINGUAL_TABLET | SUBLINGUAL | 12 refills | Status: DC | PRN

## 2017-06-04 MED ORDER — TAMSULOSIN HCL 0.4 MG PO CAPS: 0.4000 mg | ORAL_CAPSULE | Freq: Every day | ORAL | Status: DC

## 2017-06-04 MED ORDER — DABIGATRAN ETEXILATE MESYLATE 150 MG PO CAPS: 150.0000 mg | ORAL_CAPSULE | Freq: Two times a day (BID) | ORAL | Status: DC

## 2017-06-04 MED ORDER — METOPROLOL SUCCINATE ER 25 MG PO TB24: 12.5000 mg | ORAL_TABLET | Freq: Every day | ORAL | Status: DC

## 2017-06-04 NOTE — Discharge Summary (Addendum)
Name: Aarion Metzgar MRN: [id number] DOB: 1946/02/06    ADMISSION DATE:  06/03/2017 CONSULTATION DATE: 06/03/17  REFERRING MD : Dr. 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  He presented to [facility] ER on 03/28 via EMS with c/o generalized weakness, lightheadedness, nausea, and syncopal episodes onset of symptoms this week.  Upon EMS arrival at pts home his heart rate was greater than 250 bpm cardiac rhythm concerning for ventricular tachycardia.  En route to the ER the pt lost consciousness several times.  In the ER the pt was initially awake, alert, and oriented, however he did endorse fatigue and nausea.  During evaluation the pt did lose consciousness briefly, however he woke up spontaneously alert and oriented. During episode ER provider unable to obtain a blood pressure and pulse very thready. EKG revealed wide complex tachycardia, hr 200 bpm requiring cardioversion x3 with conversion to atrial fibrillation with slow ventricular rate (pt received 10 mg of etomidate during cardioversion), pt placed on amiodarone gtt.  Lab results revealed BNP 881, troponin <0.03, and CXR revealed cardiomegaly with calcified pleural plaques.  Cardiology consulted pt requesting transfer to [facility], however at this time no critical care beds available.  Pt subsequently admitted to the stepdown unit by hospitalist team for further workup and treatment.   PAST MEDICAL HISTORY :   has a past medical history of Coronary artery disease and Stroke (HCC).  has a past surgical  history that includes Mitral valve repair (October 16, 2014); Cardiac catheterization; Coronary angioplasty; and Coronary stent 2009 (2009). Prior to Admission medications   Medication Sig Start Date End Date Taking? Authorizing Provider  aspirin 81 MG tablet Take 81 mg by mouth daily.   Yes [provider]  dabigatran (PRADAXA) 150 MG CAPS capsule Take 150 mg by mouth 2 (two) times daily.   Yes [provider]  furosemide (LASIX) 40 MG tablet Take 40 mg by mouth.   Yes [provider]  metoprolol succinate (TOPROL-XL) 25 MG 24 hr tablet Take 12.5 mg by mouth daily.   Yes [provider]  nitroGLYCERIN (NITROSTAT) 0.4 MG SL tablet Place 0.4 mg under the tongue every 5 (five) minutes as needed for chest pain.   Yes [provider]  tamsulosin (FLOMAX) 0.4 MG CAPS capsule Take 0.4 mg by mouth daily.   Yes [provider]   No Known Allergies  FAMILY HISTORY:  family history is not on file. SOCIAL HISTORY:  reports that he has quit smoking. His smoking use included cigarettes. He has a 40.00 pack-year smoking history. He does not have any smokeless tobacco history on file.  REVIEW OF SYSTEMS: Positives in BOLD   Constitutional: Negative for fever, chills, weight loss, malaise/fatigue and diaphoresis.  HENT: Negative for hearing loss, ear pain, nosebleeds, congestion, sore throat, neck pain, tinnitus and ear discharge.   Eyes: Negative for blurred vision, double vision, photophobia, pain, discharge and redness.  Respiratory: Negative for cough, hemoptysis, sputum production, shortness of breath, wheezing and stridor.   Cardiovascular: Negative for chest pain, palpitations, orthopnea, claudication, leg swelling and PND.  Gastrointestinal: heartburn,  nausea, vomiting, abdominal pain, diarrhea, constipation, blood in stool and melena.  Genitourinary: Negative for dysuria, urgency, frequency, hematuria and flank pain.  Musculoskeletal: Negative for  myalgias, back pain, joint pain and falls.  Skin: Negative for itching and rash.  Neurological: dizziness, tingling, tremors, sensory change, speech change, focal weakness, seizures, loss of consciousness, weakness and headaches.  Endo/Heme/Allergies: Negative for environmental allergies and polydipsia. Does not bruise/bleed easily.  VITAL SIGNS: Temp:  [97.5 F (36.4 C)-98.4 F (36.9 C)] 98.3 F (36.8 C) (03/29 1941) Pulse Rate:  [50-81] 50 (03/29 1800) Resp:  [8-24] 15 (03/29 1800) BP: (99-140)/(60-100) 102/64 (03/29 1800) SpO2:  [90 %-100 %] 94 % (03/29 1800) Weight:  [190 lb 11.2 oz (86.5 kg)] 190 lb 11.2 oz (86.5 kg) (03/29 0500)  DISCHARGE PHYSICAL EXAMINATION: General: well nourished, well developed, NAD  Neuro: alert and oriented, follows commands  HEENT: supple, no JVD  Cardiovascular: irregular irregular at a rate of 60 beats per min, no R/G  Lungs: CTAB   Abdomen: +BS x4, soft, obese, non tender, non distended Musculoskeletal: normal bulk and tone  Skin: intact no rashes or lesions   Recent Labs  Lab 06/03/17 1238 06/04/17 0710  NA 140 141  K 3.5 3.8  CL 101 104  CO2 27 29  BUN 14 14  CREATININE 1.06 1.02  GLUCOSE 160* 105*   Recent Labs  Lab 06/03/17 1238  HGB 15.4  HCT 47.2  WBC 9.2  PLT 214   Dg Chest Portable 1 View  Result Date: 06/03/2017 CLINICAL DATA:  Ventricular tachycardia and respiratory distress EXAM: PORTABLE CHEST 1 VIEW COMPARISON:  Chest radiograph 09/21/2016 FINDINGS: Cardiomegaly with scattered calcified pleural plaques in unchanged distribution from the prior study. No focal airspace consolidation or pulmonary edema. IMPRESSION: Cardiomegaly and unchanged distribution of calcified pleural plaques. Electronically Signed   By: Deatra Robinson M.D.   On: 06/03/2017 13:41    Hospital course 72 yo male who was admitted with wide complex tachycardia presumed to be ventricular tachycardia requiring cardioversion x3 and started on an amio gtt.  He developed bradycardia hence the amio gtte was discontinued.  He was maintained on supplemental oxygen, cadiac enzymes were cycled and electrolytes were replaced. He was seen by cardiology. He remains in Afib with a HR in the 50s.  Because patient had been extensively followed at [facility], it was decided in consultation with patient and his family that he be transferred to [facility] hospital to ensure continuity of care  ASSESSMENT Wide Complex Tachycardia, presumed to be ventricular tachycardia s/p cardioversion x3 Syncope secondary to Acute Sustained Ventricular Tachycardia  Chronic Atrial Fibrillation  Mildly elevated troponin secondary to demand ischemia vs. NSTEMI  Hx: CVA, COPD, Mitral Valve Insufficiency s/p Mitral Valve Replacement, CAD, and GERD  PLAN: Transfer to [facility] ANP-BC Pulmonary and Critical Care Medicine [facility] Pager [phone] or [phone]  NB: This document was prepared using Dragon voice recognition software and may include unintentional dictation errors.

## 2017-06-04 NOTE — Discharge Instructions (Signed)
Cardiac Ablation  Cardiac ablation is a procedure to stop some heart tissue from causing problems. The heart has many electrical connections. Sometimes these connections make the heart beat very fast or irregularly. Removing some problem areas can improve the heart rhythm or make it normal.  What happens before the procedure?   Follow instructions from your doctor about what you cannot eat or drink.   Ask your doctor about:  ? Changing or stopping your normal medicines. This is important if you take diabetes medicines or blood thinners.  ? Taking medicines such as aspirin and ibuprofen. These medicines can thin your blood. Do not take these medicines before your procedure if your doctor tells you not to.   Plan to have someone take you home.   If you will be going home right after the procedure, plan to have someone with you for 24 hours.  What happens during the procedure?   To lower your risk of infection:  ? Your health care team will wash or sanitize their hands.  ? Your skin will be washed with soap.  ? Hair may be removed from your neck or groin.   An IV tube will be put into one of your veins.   You will be given a medicine to help you relax (sedative).   Skin on your neck or groin will be numbed.   A cut (incision) will be made in your neck or groin.   A needle will be put through your cut and into a vein in your neck or groin.   A tube (catheter) will be put into the needle. The tube will be moved to your heart. X-rays (fluoroscopy) will be used to help guide the tube.   Small devices (electrodes) on the tip of the tube will send out electrical currents.   Dye may be put through the tube. This helps your surgeon see your heart.   Electrical energy will be used to scar (ablate) some heart tissue. Your surgeon may use:  ? Heat (radiofrequency energy).  ? Laser energy.  ? Extreme cold (cryoablation).   The tube will be taken out.   Pressure will be held on your cut. This helps stop  bleeding.   A bandage (dressing) will be put on your cut.  The procedure may vary.  What happens after the procedure?   You will be monitored until your medicines have worn off.   Your cut will be watched for bleeding. You will need to lie still for a few hours.   Do not drive for 24 hours or as long as your doctor tells you.  Summary   Cardiac ablation is a procedure to stop some heart tissue from causing problems.   Electrical energy will be used to scar (ablate) some heart tissue.  This information is not intended to replace advice given to you by your health care provider. Make sure you discuss any questions you have with your health care provider.  Document Released: 10/26/2012 Document Revised: 01/13/2016 Document Reviewed: 01/13/2016  Elsevier Interactive Patient Education  2017 [employer]

## 2017-06-04 NOTE — Progress Notes (Signed)
[facility] Cardiology  SUBJECTIVE: Patient laying comfortably in bed, denies chest pain or shortness of breath   Vitals:   06/04/17 0400 06/04/17 0500 06/04/17 0700 06/04/17 0740  BP: 117/75 (!) 136/99 132/88   Pulse: 65 68 73 69  Resp: 13 11 10 15   Temp: 98 F (36.7 C)   97.6 F (36.4 C)  TempSrc: Oral   Oral  SpO2: 98% 97% 99% 100%  Weight:  86.5 kg (190 lb 11.2 oz)    Height:         Intake/Output Summary (Last 24 hours) at 06/04/2017 0750 Last data filed at 06/04/2017 0700 Gross per 24 hour  Intake 43.3 ml  Output 450 ml  Net -406.7 ml      PHYSICAL EXAM  General: Well developed, well nourished, in no acute distress HEENT:  Normocephalic and atramatic Neck:  No JVD.  Lungs: Clear bilaterally to auscultation and percussion. Heart: Irregularly irregular rhythm. Normal S1 and S2 without gallops or murmurs.  Abdomen: Bowel sounds are positive, abdomen soft and non-tender  Msk:  Back normal, normal gait. Normal strength and tone for age. Extremities: No clubbing, cyanosis or edema.   Neuro: Alert and oriented X 3. Psych:  Good affect, responds appropriately   LABS: Basic Metabolic Panel: Recent Labs    06/03/17 1238  NA 140  K 3.5  CL 101  CO2 27  GLUCOSE 160*  BUN 14  CREATININE 1.06  CALCIUM 9.2   Liver Function Tests: Recent Labs    06/03/17 1238  AST 34  ALT 21  ALKPHOS 76  BILITOT 1.1  PROT 7.4  ALBUMIN 4.0   No results for input(s): LIPASE, AMYLASE in the last 72 hours. CBC: Recent Labs    06/03/17 1238  WBC 9.2  HGB 15.4  HCT 47.2  MCV 94.0  PLT 214   Cardiac Enzymes: Recent Labs    06/03/17 1238 06/03/17 1844 06/04/17 0028  TROPONINI <0.03 0.04* 0.04*   BNP: Invalid input(s): POCBNP D-Dimer: No results for input(s): DDIMER in the last 72 hours. Hemoglobin A1C: No results for input(s): HGBA1C in the last 72 hours. Fasting Lipid Panel: No results for input(s): CHOL, HDL, LDLCALC, TRIG, CHOLHDL, LDLDIRECT in the last 72  hours. Thyroid Function Tests: Recent Labs    06/03/17 1844  TSH 0.641   Anemia Panel: No results for input(s): VITAMINB12, FOLATE, FERRITIN, TIBC, IRON, RETICCTPCT in the last 72 hours.  Dg Chest Portable 1 View  Result Date: 06/03/2017 CLINICAL DATA:  Ventricular tachycardia and respiratory distress EXAM: PORTABLE CHEST 1 VIEW COMPARISON:  Chest radiograph 09/21/2016 FINDINGS: Cardiomegaly with scattered calcified pleural plaques in unchanged distribution from the prior study. No focal airspace consolidation or pulmonary edema. IMPRESSION: Cardiomegaly and unchanged distribution of calcified pleural plaques. Electronically Signed   By: Deatra Robinson M.D.   On: 06/03/2017 13:41     Echo pending  TELEMETRY: Atrial fibrillation at 70 bpm:  ASSESSMENT AND PLAN:  Active Problems:   Ventricular tachycardia (HCC)   VT (ventricular tachycardia) (HCC)    1.  Wide-complex tachycardia, probable ventricular tachycardia, requiring cardioversion x3, transiently on amiodarone drip which was discontinued due to bradycardia 2.  Chronic atrial fibrillation, with controlled ventricular rate, on Pradaxa for stroke prevention, which is being held for possible cardiac catheterization 3.  Known coronary artery disease, status post coronary stent RCA 2009, without chest pain, with borderline elevated troponin, likely demand supply ischemia in the setting of wide-complex tachycardia  Recommendations  1.  Agree with current  therapy 2.  Continue to hold amiodarone drip for now 3.  Hold Pradaxa 4.  Cardiac catheterization to rule out significant underlying coronary artery disease 5.  2D echocardiogram 6.  Will review ECG with [facility] EP for further recommendations regarding wide-complex tachycardia    Marcina Millard, MD, PhD, [facility] 06/04/2017 7:50 AM

## 2017-06-04 NOTE — Progress Notes (Signed)
[employer] [facility] [place] [facility]   PATIENT NAME: Darius Miller    MR#:  [id number]  DATE OF BIRTH:  [birth year]  SUBJECTIVE:   Patient presented to the hospital due to persistent dizziness/recurrent syncope and noted to be in recurrent ventricular tachycardia.  Patient was admitted to the intensive care unit placed on a amiodarone drip and this morning developed some bradycardia and therefore and the amiodarone drip stopped.  Patient denies any acute symptoms presently.  REVIEW OF SYSTEMS:    Review of Systems  Constitutional: Negative for chills and fever.  HENT: Negative for congestion and tinnitus.   Eyes: Negative for blurred vision and double vision.  Respiratory: Negative for cough, shortness of breath and wheezing.   Cardiovascular: Negative for chest pain, orthopnea and PND.  Gastrointestinal: Negative for abdominal pain, diarrhea, nausea and vomiting.  Genitourinary: Negative for dysuria and hematuria.  Neurological: Positive for dizziness. Negative for sensory change and focal weakness.  All other systems reviewed and are negative.   Nutrition: Heart Healthy Tolerating Diet: Yes Tolerating PT: Await Eval.     DRUG ALLERGIES:  No Known Allergies  VITALS:  Blood pressure 114/70, pulse (!) 59, temperature 98 F (36.7 C), temperature source Oral, resp. rate 19, height 6' (1.829 m), weight 86.5 kg (190 lb 11.2 oz), SpO2 93 %.  PHYSICAL EXAMINATION:   Physical Exam  GENERAL:  72 y.o.-year-old patient lying in bed in no acute distress.  EYES: Pupils equal, round, reactive to light and accommodation. No scleral icterus. Extraocular muscles intact.  HEENT: Head atraumatic, normocephalic. Oropharynx and nasopharynx clear.  NECK:  Supple, no jugular venous distention. No thyroid enlargement, no tenderness.  LUNGS: Normal breath sounds bilaterally, no wheezing, rales, rhonchi. No use of accessory muscles of respiration.  CARDIOVASCULAR: S1, S2 normal. No  murmurs, rubs, or gallops.  ABDOMEN: Soft, nontender, nondistended. Bowel sounds present. No organomegaly or mass.  EXTREMITIES: No cyanosis, clubbing or edema b/l.    NEUROLOGIC: Cranial nerves II through XII are intact. No focal Motor or sensory deficits b/l.   PSYCHIATRIC: The patient is alert and oriented x 3.  SKIN: No obvious rash, lesion, or ulcer.    LABORATORY PANEL:   CBC Recent Labs  Lab 06/03/17 1238  WBC 9.2  HGB 15.4  HCT 47.2  PLT 214   ------------------------------------------------------------------------------------------------------------------  Chemistries  Recent Labs  Lab 06/03/17 1238 06/04/17 0710 06/04/17 1509  NA 140 141  --   K 3.5 3.8  --   CL 101 104  --   CO2 27 29  --   GLUCOSE 160* 105*  --   BUN 14 14  --   CREATININE 1.06 1.02  --   CALCIUM 9.2 8.9  --   MG  --   --  1.7  AST 34  --   --   ALT 21  --   --   ALKPHOS 76  --   --   BILITOT 1.1  --   --    ------------------------------------------------------------------------------------------------------------------  Cardiac Enzymes Recent Labs  Lab 06/04/17 0710  TROPONINI 0.04*   ------------------------------------------------------------------------------------------------------------------  RADIOLOGY:  Dg Chest Portable 1 View  Result Date: 06/03/2017 CLINICAL DATA:  Ventricular tachycardia and respiratory distress EXAM: PORTABLE CHEST 1 VIEW COMPARISON:  Chest radiograph 09/21/2016 FINDINGS: Cardiomegaly with scattered calcified pleural plaques in unchanged distribution from the prior study. No focal airspace consolidation or pulmonary edema. IMPRESSION: Cardiomegaly and unchanged distribution of calcified pleural plaques. Electronically Signed   By: Caryn Bee  Chase Picket M.D.   On: 06/03/2017 13:41     ASSESSMENT AND PLAN:   72 year old male with past medical history coronary artery disease, history of previous CVA, chronic atrial fibrillation who presents to the hospital  due to dizziness, recurrent syncope and noted to be in a wide-complex ventricular tachycardia.  1.  Wide-complex tachycardia-this was suspected to be ventricular tachycardia.  Patient was cardioverted x3 and then placed on amiodarone drip.  Patient has now converted to a sinus rhythm.  He developed some bradycardia this morning and therefore the amiodarone drip was stopped. -Appreciate cardiology consult input and continue beta-blockers for now.  2.  Chronic atrial fibrillation/flutter- controlled.  Patient will continue his Toprol.  Hold Pradaxa for now as patient may likely need cardiac catheterization.  3.  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  4.  History of previous CVA- continue statin, Pradaxa on hold as patient may need cardiac catheterization.  5.  GERD-continue Protonix.  6.  BPH-continue Flomax.  Discussed with cardiology and patient is extensively followed up at [facility] and he would benefit from cardiac catheterization and also a electrophysiologic study but this is to be done at [facility].  Patient is being transferred there for further care.  Notified nursing staff and ICU staff.  EMPTALA form has been done.   All the records are reviewed and case discussed with Care Management/Social Worker. Management plans discussed with the patient, family and they are in agreement.  CODE STATUS: Full code  DVT Prophylaxis: Lovenox  TOTAL TIME TAKING CARE OF THIS PATIENT: 45 minutes.   POSSIBLE D/C to [facility] Later today when bed available.    Houston Siren M.D on 06/04/2017 at 4:15 PM  Between 7am to 6pm - Pager - [phone]  After 6pm go to [email or web address] - Social research officer, government  [employer] [facility] [place] [facility]  Office  [phone]  CC: Primary care physician; Gracelyn Nurse, MD

## 2017-06-04 NOTE — Progress Notes (Signed)
*  PRELIMINARY RESULTS* Echocardiogram 2D Echocardiogram has been performed.  [name] [name] [name] 06/04/2017, 9:08 AM

## 2017-06-04 NOTE — Progress Notes (Signed)
Call received from EMS transportation service. Stated they will be here in 45 minutes for transport. Report called to [facility] in the step-down unit at [facility]. No further questions from receiving RN. Direct number given to nurse incase there were further questions. Will give Darius Miller a call when patient has been picked up. Patient notified his wife and she is going home to sleep and will see him in the morning.

## 2017-06-04 NOTE — Progress Notes (Signed)
EMS team here at present time. Report given to Robin with EMS and Caro Hight, the RN at [facility], notified with update.

## 2017-06-04 NOTE — Progress Notes (Signed)
   Name: Darius Miller MRN: [id number] DOB: Dec 20, 1945     CONSULTATION DATE: 06/03/2017  Subjective: Feels better no chest pain  Objectives: On amiodarone drip because of bradycardia.  Plan per cardiology is transferred to [facility] for EP study.   HISTORY OF PRESENT ILLNESS:    PAST MEDICAL HISTORY :   has a past medical history of Coronary artery disease and Stroke (HCC).  has a past surgical history that includes Mitral valve repair (October 16, 2014); Cardiac catheterization; Coronary angioplasty; and Coronary stent 2009 (2009). Prior to Admission medications   Medication Sig Start Date End Date Taking? Authorizing Provider  aspirin 81 MG tablet Take 81 mg by mouth daily.   Yes [provider]  dabigatran (PRADAXA) 150 MG CAPS capsule Take 150 mg by mouth 2 (two) times daily.   Yes [provider]  furosemide (LASIX) 40 MG tablet Take 40 mg by mouth.   Yes [provider]  metoprolol succinate (TOPROL-XL) 25 MG 24 hr tablet Take 12.5 mg by mouth daily.   Yes [provider]  nitroGLYCERIN (NITROSTAT) 0.4 MG SL tablet Place 0.4 mg under the tongue every 5 (five) minutes as needed for chest pain.   Yes [provider]  tamsulosin (FLOMAX) 0.4 MG CAPS capsule Take 0.4 mg by mouth daily.   Yes [provider]   No Known Allergies  FAMILY HISTORY:  family history is not on file. SOCIAL HISTORY:  reports that he has quit smoking. His smoking use included cigarettes. He has a 40.00 pack-year smoking history. He does not have any smokeless tobacco history on file.  REVIEW OF SYSTEMS:   Unable to obtain due to critical illness   VITAL SIGNS: Temp:  [98 F (36.7 C)-98.4 F (36.9 C)] 98 F (36.7 C) (03/29 1341) Pulse Rate:  [53-150] 59 (03/29 1341) Resp:  [0-24] 19 (03/29 1341) BP: (92-140)/(63-100) 114/70 (03/29 1300) SpO2:  [90 %-100 %] 93 % (03/29 1341) Weight:  [85.8 kg (189 lb 2.5 oz)-86.5 kg (190 lb 11.2 oz)] 86.5 kg (190  lb 11.2 oz) (03/29 0500)  Physical Examination:  Awake and oriented was no acute neurological deficits On [place], no distress, able to talk in full sentences, BEAE and no rales S1 + S2 are audible with no murmur Benign abdomen was normal peristalsis No edema  I personally reviewed lab work that was obtained in last 24 hrs. CXR Independently reviewed   ASSESSMENT / PLAN:  Wide-complex tachycardia status post cardioversion.  Of amiodarone because of bradycardia.  Patient is accepted to [facility] awaiting bed availability  Chronic atrial fibrillation of Pradaxa as per cardiology for possible invasive workup with repeat study  Coronary artery disease a status post PCI to RCA 2009. Beta-blocker + ASA+ statin  Full code  Supportive care  Critical care time 30 minutes

## 2017-06-05 LAB — URINE DRUGS OF ABUSE SCREEN W ALC, ROUTINE (REF LAB)
AMPHETAMINES, URINE: NEGATIVE ng/mL
BENZODIAZEPINE QUANT UR: NEGATIVE ng/mL
Barbiturate, Ur: NEGATIVE ng/mL
CANNABINOID QUANT UR: NEGATIVE ng/mL
COCAINE (METAB.): NEGATIVE ng/mL
Ethanol U, Quan: NEGATIVE %
METHADONE SCREEN, URINE: NEGATIVE ng/mL
OPIATE QUANT UR: NEGATIVE ng/mL
PHENCYCLIDINE, UR: NEGATIVE ng/mL
Propoxyphene, Urine: NEGATIVE ng/mL

## 2017-06-05 LAB — CALCIUM, IONIZED: Calcium, Ionized, Serum: 5.3 mg/dL (ref 4.5–5.6)

## 2017-06-06 LAB — ECHOCARDIOGRAM COMPLETE
HEIGHTINCHES: 72 in
WEIGHTICAEL: 3051.17 [oz_av]

## 2018-01-18 ENCOUNTER — Inpatient Hospital Stay
Admission: EM | Admit: 2018-01-18 | Discharge: 2018-01-22 | DRG: 309 | Disposition: A | Discharge status: Home / Self Care | Payer: Medicare HMO | Attending: Internal Medicine | Admitting: Internal Medicine

## 2018-01-18 ENCOUNTER — Other Ambulatory Visit: Payer: Self-pay

## 2018-01-18 ENCOUNTER — Emergency Department: Payer: Medicare HMO

## 2018-01-18 DIAGNOSIS — I4819 Other persistent atrial fibrillation: Secondary | ICD-10-CM | POA: Diagnosis present

## 2018-01-18 DIAGNOSIS — I25118 Atherosclerotic heart disease of native coronary artery with other forms of angina pectoris: Secondary | ICD-10-CM | POA: Diagnosis not present

## 2018-01-18 DIAGNOSIS — Z95810 Presence of automatic (implantable) cardiac defibrillator: Secondary | ICD-10-CM | POA: Diagnosis not present

## 2018-01-18 DIAGNOSIS — Z955 Presence of coronary angioplasty implant and graft: Secondary | ICD-10-CM

## 2018-01-18 DIAGNOSIS — Z79899 Other long term (current) drug therapy: Secondary | ICD-10-CM | POA: Diagnosis not present

## 2018-01-18 DIAGNOSIS — I959 Hypotension, unspecified: Secondary | ICD-10-CM

## 2018-01-18 DIAGNOSIS — Z7902 Long term (current) use of antithrombotics/antiplatelets: Secondary | ICD-10-CM | POA: Diagnosis not present

## 2018-01-18 DIAGNOSIS — Z8679 Personal history of other diseases of the circulatory system: Secondary | ICD-10-CM

## 2018-01-18 DIAGNOSIS — Z87442 Personal history of urinary calculi: Secondary | ICD-10-CM

## 2018-01-18 DIAGNOSIS — I5022 Chronic systolic (congestive) heart failure: Secondary | ICD-10-CM | POA: Diagnosis not present

## 2018-01-18 DIAGNOSIS — E785 Hyperlipidemia, unspecified: Secondary | ICD-10-CM | POA: Diagnosis present

## 2018-01-18 DIAGNOSIS — I429 Cardiomyopathy, unspecified: Secondary | ICD-10-CM | POA: Diagnosis present

## 2018-01-18 DIAGNOSIS — N200 Calculus of kidney: Secondary | ICD-10-CM | POA: Diagnosis present

## 2018-01-18 DIAGNOSIS — Z8673 Personal history of transient ischemic attack (TIA), and cerebral infarction without residual deficits: Secondary | ICD-10-CM | POA: Diagnosis not present

## 2018-01-18 DIAGNOSIS — I472 Ventricular tachycardia, unspecified: Secondary | ICD-10-CM

## 2018-01-18 DIAGNOSIS — I4589 Other specified conduction disorders: Secondary | ICD-10-CM | POA: Diagnosis present

## 2018-01-18 DIAGNOSIS — Z7901 Long term (current) use of anticoagulants: Secondary | ICD-10-CM

## 2018-01-18 DIAGNOSIS — Z8249 Family history of ischemic heart disease and other diseases of the circulatory system: Secondary | ICD-10-CM | POA: Diagnosis not present

## 2018-01-18 DIAGNOSIS — Z888 Allergy status to other drugs, medicaments and biological substances status: Secondary | ICD-10-CM

## 2018-01-18 DIAGNOSIS — N289 Disorder of kidney and ureter, unspecified: Secondary | ICD-10-CM

## 2018-01-18 DIAGNOSIS — D539 Nutritional anemia, unspecified: Secondary | ICD-10-CM | POA: Diagnosis present

## 2018-01-18 DIAGNOSIS — R0902 Hypoxemia: Secondary | ICD-10-CM | POA: Diagnosis present

## 2018-01-18 DIAGNOSIS — N179 Acute kidney failure, unspecified: Secondary | ICD-10-CM | POA: Diagnosis present

## 2018-01-18 DIAGNOSIS — I499 Cardiac arrhythmia, unspecified: Secondary | ICD-10-CM | POA: Diagnosis present

## 2018-01-18 DIAGNOSIS — Z87891 Personal history of nicotine dependence: Secondary | ICD-10-CM

## 2018-01-18 DIAGNOSIS — I5042 Chronic combined systolic (congestive) and diastolic (congestive) heart failure: Secondary | ICD-10-CM | POA: Diagnosis present

## 2018-01-18 DIAGNOSIS — R000 Tachycardia, unspecified: Secondary | ICD-10-CM | POA: Diagnosis not present

## 2018-01-18 DIAGNOSIS — Z7982 Long term (current) use of aspirin: Secondary | ICD-10-CM

## 2018-01-18 DIAGNOSIS — I110 Hypertensive heart disease with heart failure: Secondary | ICD-10-CM | POA: Diagnosis present

## 2018-01-18 DIAGNOSIS — I2510 Atherosclerotic heart disease of native coronary artery without angina pectoris: Secondary | ICD-10-CM | POA: Diagnosis present

## 2018-01-18 HISTORY — DX: Calculus of kidney: N20.0

## 2018-01-18 HISTORY — DX: Unspecified atrial fibrillation: I48.91

## 2018-01-18 HISTORY — DX: Ventricular tachycardia, unspecified: I47.20

## 2018-01-18 HISTORY — DX: Presence of automatic (implantable) cardiac defibrillator: Z95.810

## 2018-01-18 HISTORY — DX: Ventricular tachycardia: I47.2

## 2018-01-18 HISTORY — DX: Other specified postprocedural states: Z98.890

## 2018-01-18 LAB — CBC
HCT: 43.9 % (ref 39.0–52.0)
Hemoglobin: 14 g/dL (ref 13.0–17.0)
MCH: 30.8 pg (ref 26.0–34.0)
MCHC: 31.9 g/dL (ref 30.0–36.0)
MCV: 96.5 fL (ref 80.0–100.0)
Platelets: 218 10*3/uL (ref 150–400)
RBC: 4.55 MIL/uL (ref 4.22–5.81)
RDW: 13.8 % (ref 11.5–15.5)
WBC: 9.2 10*3/uL (ref 4.0–10.5)
nRBC: 0 % (ref 0.0–0.2)

## 2018-01-18 LAB — BASIC METABOLIC PANEL
Anion gap: 8 (ref 5–15)
BUN: 28 mg/dL — AB (ref 8–23)
CALCIUM: 9 mg/dL (ref 8.9–10.3)
CO2: 29 mmol/L (ref 22–32)
Chloride: 105 mmol/L (ref 98–111)
Creatinine, Ser: 1.7 mg/dL — ABNORMAL HIGH (ref 0.61–1.24)
GFR calc Af Amer: 45 mL/min — ABNORMAL LOW (ref >60)
GFR, EST NON AFRICAN AMERICAN: 39 mL/min — AB (ref >60)
Glucose, Bld: 140 mg/dL — ABNORMAL HIGH (ref 70–99)
Potassium: 3.8 mmol/L (ref 3.5–5.1)
SODIUM: 142 mmol/L (ref 135–145)

## 2018-01-18 LAB — TROPONIN I: Troponin I: <0.03 ng/mL (ref <0.03)

## 2018-01-18 MED ORDER — AMIODARONE IV BOLUS ONLY 150 MG/100ML
INTRAVENOUS | Status: AC
  Filled 2018-01-18: qty 100

## 2018-01-18 MED ORDER — AMIODARONE HCL IN DEXTROSE 360-4.14 MG/200ML-% IV SOLN
INTRAVENOUS | Status: AC
  Filled 2018-01-18: qty 200

## 2018-01-18 MED ORDER — AMIODARONE IV BOLUS ONLY 150 MG/100ML
150.0000 mg | Freq: Once | INTRAVENOUS | Status: AC
  Administered 2018-01-18: 150 mg via INTRAVENOUS
  Filled 2018-01-18: qty 100

## 2018-01-18 MED ORDER — AMIODARONE IV BOLUS ONLY 150 MG/100ML: 150.0000 mg | Freq: Once | INTRAVENOUS | Status: DC

## 2018-01-18 NOTE — ED Provider Notes (Signed)
[facility] Emergency Department Provider Note  ____________________________________________  Time seen: Approximately 9:45 PM  I have reviewed the triage vital signs and the nursing notes.   HISTORY  Chief Complaint Irregular Heart Beat    HPI Darius Miller is a 72 y.o. male with a history of A. fib, wide-complex tachycardia status post AICD, CAD, CVA, presenting for sustained V. tach.  The patient reports that today he has been feeling lightheaded and short of breath with exertion.  He sat down in a chair and had a brief syncopal episode; when he became responsive he noted that his lower denture had fallen out.  He has not had any chest pain or palpitations.  He does note that his AICD was interrogated 1 month ago and his amiodarone was increased from 200 mg to 400 mg daily.  EMS noted the patient to be mentating normally with low blood pressure in the 90s over 50s and there EKG showed a sustained wide-complex tachycardia in the 170s.  The patient's blood sugar was normal.  In route, the patient was given 150 mg of amiodarone and he remained stable with normal mentation.   Past Medical History:  Diagnosis Date  . Atrial fibrillation (HCC)   . Coronary artery disease   . Stroke [facility])     Patient Active Problem List   Diagnosis Date Noted  . V-tach (HCC) 01/18/2018  . Ventricular tachycardia (HCC) 06/03/2017  . VT (ventricular tachycardia) (HCC) 06/03/2017        Allergies Flomax [tamsulosin hcl]  No family history on file.  Social History Social History   Tobacco Use  . Smoking status: Former Smoker    Packs/day: 2.00    Years: 20.00    Pack years: 40.00    Types: Cigarettes  Substance Use Topics  . Alcohol use: Not on file  . Drug use: Not on file    Review of Systems Constitutional: No fever/chills.  Positive lightheadedness with syncope seated. Eyes: No visual changes. ENT: No sore throat. No congestion or  rhinorrhea. Cardiovascular: Denies chest pain. Denies palpitations. Respiratory: Denies shortness of breath.  No cough. Gastrointestinal: No abdominal pain.  No nausea, no vomiting.  No diarrhea.  No constipation. Genitourinary: Negative for dysuria. Musculoskeletal: Negative for back pain.  No lower extremity swelling or calf pain. Skin: Negative for rash. Neurological: Negative for headaches. No focal numbness, tingling or weakness.     ____________________________________________   PHYSICAL EXAM:  VITAL SIGNS: ED Triage Vitals [01/18/18 2134]  Enc Vitals Group     BP 103/81     Pulse Rate (!) 174     Resp 18     Temp      Temp src      SpO2 98 %     Weight 182 lb (82.6 kg)     Height 5' 11.5" (1.816 m)     Head Circumference      Peak Flow      Pain Score 0     Pain Loc      Pain Edu?      Excl. in GC?     Constitutional: Alert and oriented. Answers questions appropriately. Eyes: Conjunctivae are normal.  EOMI. No scleral icterus. Head: Atraumatic. Nose: No congestion/rhinnorhea. Mouth/Throat: Mucous membranes are moist.  Neck: No stridor.  Supple.  No JVD.  No meningismus. Cardiovascular: Fast rate, regular rhythm. No murmurs, rubs or gallops.  AICD can be palpated in the left upper chest.  On the monitor, the  patient has a wide-complex tachycardia. Respiratory: Normal respiratory effort.  No accessory muscle use or retractions. Lungs CTAB.  No wheezes, rales or ronchi. Gastrointestinal: Soft, nontender and nondistended.  No guarding or rebound.  No peritoneal signs. Musculoskeletal: No LE edema. No ttp in the calves or palpable cords.  Negative Homan's sign. Neurologic:  A&Ox3.  Speech is clear.  Face and smile are symmetric.  EOMI.  Moves all extremities well. Skin:  Skin is warm, dry and intact. No rash noted. Psychiatric: Mood and affect are normal. Speech and behavior are normal.  Normal judgement.  ____________________________________________   LABS (all  labs ordered are listed, but only abnormal results are displayed)  Labs Reviewed  BASIC METABOLIC PANEL - Abnormal; Notable for the following components:      Result Value   Glucose, Bld 140 (*)    BUN 28 (*)    Creatinine, Ser 1.70 (*)    GFR calc non Af Amer 39 (*)    GFR calc Af Amer 45 (*)    All other components within normal limits  MRSA PCR SCREENING  CBC  TROPONIN I   ____________________________________________  EKG  ED ECG REPORT I, Anne-Caroline Sharma Covert, the attending physician, personally viewed and interpreted this ECG.   Date: 01/18/2018  EKG Time: 2127  Rate: 172  Rhythm: Wide Complex Ventricular Tachycardia  Axis: leftward  Intervals:prolonged QRS  ST&T Change: No STEMI   Repeat EKG: ED ECG REPORT I, Anne-Caroline Sharma Covert, the attending physician, personally viewed and interpreted this ECG.   Date: 01/18/2018  EKG Time: 2211  Rate: 52  Rhythm: Paced at 52   ____________________________________________  RADIOLOGY  Dg Chest Portable 1 View  Result Date: 01/18/2018 CLINICAL DATA:  Dizziness.  Lightheadedness. EXAM: PORTABLE CHEST 1 VIEW COMPARISON:  September 21, 2016 FINDINGS: Calcified pleural plaques. Stable AICD. Stable cardiomegaly. The hila and mediastinum are unremarkable. No overt edema. No focal infiltrate. IMPRESSION: Stable calcified pleural plaques and cardiomegaly. No other acute abnormalities. Electronically Signed   By: Gerome Sam III M.D   On: 01/18/2018 22:01    ____________________________________________   PROCEDURES  Procedure(s) performed: None  Procedures  Critical Care performed: Yes, see critical care note(s) ____________________________________________   INITIAL IMPRESSION / ASSESSMENT AND PLAN / ED COURSE  Pertinent labs & imaging results that were available during my care of the patient were reviewed by me and considered in my medical decision making (see chart for details).  72 y.o. male with AICD status post  V. tach presenting with sustained wide-complex tachycardia, mild hypotension.  Overall, the patient is mildly uncomfortable appearing but is tolerating his V. tach well.  He has a blood pressure of 103 over 80s here and is mentating normally.  This patient likely has a baseline V. tach and has not had his fibrillation threshold.  I am also concerned about defibrillator malfunction.    I have spoken with Dr. Mariah Milling, the cardiologist on-call as the patient is usually followed by [facility] cardiology.  He recommends back-to-back amiodarone boluses, and if the patient does not break after several more boluses, we can initiate defibrillation of the patient.  It is likely that the patient is not meeting his threshold for defibrillation.  ED course: At the end of 300 mg of amiodarone, the patient's wide-complex tachycardia did convert to a paced rhythm at 50.  The patient's blood pressure improved and he continued to mentate normally.  The patient was admitted to the hospital in more stable condition.  CRITICAL CARE Performed  by: Rockne Menghini   Total critical care time: 40 minutes  Critical care time was exclusive of separately billable procedures and treating other patients.  Critical care was necessary to treat or prevent imminent or life-threatening deterioration.  Critical care was time spent personally by me on the following activities: development of treatment plan with patient and/or surrogate as well as nursing, discussions with consultants, evaluation of patient's response to treatment, examination of patient, obtaining history from patient or surrogate, ordering and performing treatments and interventions, ordering and review of laboratory studies, ordering and review of radiographic studies, pulse oximetry and re-evaluation of patient's condition.   ____________________________________________  FINAL CLINICAL IMPRESSION(S) / ED DIAGNOSES  Final diagnoses:  Ventricular tachycardia  (HCC)  Hypotension, unspecified hypotension type  Acute renal insufficiency         NEW MEDICATIONS STARTED DURING THIS VISIT:  Current Discharge Medication List        Rockne Menghini, MD 01/18/18 2358

## 2018-01-18 NOTE — ED Notes (Signed)
Went to start the third amiodarone bolus. Heart rate was 50. Provider made aware.

## 2018-01-18 NOTE — ED Notes (Signed)
Report was given at this time 

## 2018-01-18 NOTE — ED Notes (Signed)
New EKG done and given to provider.

## 2018-01-18 NOTE — ED Notes (Signed)
Pt received amiodarone 150 mg bolus via EMS.

## 2018-01-18 NOTE — ED Notes (Signed)
Attempted to call report. Nurse was not available, stated that they would call back.

## 2018-01-18 NOTE — ED Triage Notes (Signed)
Pt stated that he was driving home and he started feeling dizzy and lightheaded. EMS stated that when they arrived pt was sitting up and A&OX4. On arrival to the ED, pt is A&Ox4, in no distress noted, pt denies any chest pain. Pt had a ICD placed at the beginning of April.

## 2018-01-18 NOTE — ED Notes (Signed)
2nd bolus of amiodarone is finished. Provider made aware and stated to given another 150 mg bolus.

## 2018-01-19 ENCOUNTER — Inpatient Hospital Stay: Payer: Medicare HMO

## 2018-01-19 ENCOUNTER — Inpatient Hospital Stay
Admit: 2018-01-19 | Discharge: 2018-01-19 | Disposition: A | Discharge status: Home / Self Care | Payer: Medicare HMO | Attending: Pulmonary Disease | Admitting: Pulmonary Disease

## 2018-01-19 DIAGNOSIS — R000 Tachycardia, unspecified: Secondary | ICD-10-CM

## 2018-01-19 DIAGNOSIS — R55 Syncope and collapse: Secondary | ICD-10-CM

## 2018-01-19 DIAGNOSIS — I5022 Chronic systolic (congestive) heart failure: Secondary | ICD-10-CM

## 2018-01-19 DIAGNOSIS — I472 Ventricular tachycardia: Principal | ICD-10-CM

## 2018-01-19 LAB — GLUCOSE, CAPILLARY
GLUCOSE-CAPILLARY: 117 mg/dL — AB (ref 70–99)
GLUCOSE-CAPILLARY: 95 mg/dL (ref 70–99)
GLUCOSE-CAPILLARY: 110 mg/dL — AB (ref 70–99)
GLUCOSE-CAPILLARY: 90 mg/dL (ref 70–99)
Glucose-Capillary: 123 mg/dL — ABNORMAL HIGH (ref 70–99)
Glucose-Capillary: 136 mg/dL — ABNORMAL HIGH (ref 70–99)

## 2018-01-19 LAB — TROPONIN I
TROPONIN I: 0.04 ng/mL — AB (ref <0.03)
Troponin I: 0.04 ng/mL (ref <0.03)
Troponin I: 0.03 ng/mL (ref <0.03)

## 2018-01-19 LAB — BASIC METABOLIC PANEL
Anion gap: 7 (ref 5–15)
BUN: 25 mg/dL — AB (ref 8–23)
CHLORIDE: 105 mmol/L (ref 98–111)
CO2: 29 mmol/L (ref 22–32)
Calcium: 9.1 mg/dL (ref 8.9–10.3)
Creatinine, Ser: 1.36 mg/dL — ABNORMAL HIGH (ref 0.61–1.24)
GFR calc Af Amer: 59 mL/min — ABNORMAL LOW (ref >60)
GFR calc non Af Amer: 51 mL/min — ABNORMAL LOW (ref >60)
GLUCOSE: 136 mg/dL — AB (ref 70–99)
POTASSIUM: 4.8 mmol/L (ref 3.5–5.1)
Sodium: 141 mmol/L (ref 135–145)

## 2018-01-19 LAB — CBC
HEMATOCRIT: 39.5 % (ref 39.0–52.0)
HEMOGLOBIN: 12.6 g/dL — AB (ref 13.0–17.0)
MCH: 30.6 pg (ref 26.0–34.0)
MCHC: 31.9 g/dL (ref 30.0–36.0)
MCV: 95.9 fL (ref 80.0–100.0)
Platelets: 181 10*3/uL (ref 150–400)
RBC: 4.12 MIL/uL — ABNORMAL LOW (ref 4.22–5.81)
RDW: 13.9 % (ref 11.5–15.5)
WBC: 8.2 10*3/uL (ref 4.0–10.5)
nRBC: 0 % (ref 0.0–0.2)

## 2018-01-19 LAB — MAGNESIUM: Magnesium: 2.1 mg/dL (ref 1.7–2.4)

## 2018-01-19 LAB — MRSA PCR SCREENING: MRSA by PCR: NEGATIVE

## 2018-01-19 MED ORDER — AMIODARONE HCL 200 MG PO TABS
400.0000 mg | ORAL_TABLET | Freq: Every day | ORAL | Status: DC
  Administered 2018-01-19: 400 mg via ORAL
  Filled 2018-01-19: qty 2

## 2018-01-19 MED ORDER — ACETAMINOPHEN 325 MG PO TABS: 650.0000 mg | ORAL_TABLET | Freq: Four times a day (QID) | ORAL | Status: DC | PRN

## 2018-01-19 MED ORDER — LOSARTAN POTASSIUM 25 MG PO TABS
12.5000 mg | ORAL_TABLET | Freq: Two times a day (BID) | ORAL | Status: DC
  Administered 2018-01-19 – 2018-01-20 (×2): 12.5 mg via ORAL
  Filled 2018-01-19 (×6): qty 1

## 2018-01-19 MED ORDER — INSULIN ASPART 100 UNIT/ML ~~LOC~~ SOLN: 0.0000 [IU] | Freq: Every day | SUBCUTANEOUS | Status: DC

## 2018-01-19 MED ORDER — TRAZODONE HCL 50 MG PO TABS: 25.0000 mg | ORAL_TABLET | Freq: Every evening | ORAL | Status: DC | PRN

## 2018-01-19 MED ORDER — DABIGATRAN ETEXILATE MESYLATE 150 MG PO CAPS
150.0000 mg | ORAL_CAPSULE | Freq: Two times a day (BID) | ORAL | Status: DC
  Administered 2018-01-19 – 2018-01-22 (×8): 150 mg via ORAL
  Filled 2018-01-19 (×11): qty 1

## 2018-01-19 MED ORDER — ONDANSETRON HCL 4 MG PO TABS: 4.0000 mg | ORAL_TABLET | Freq: Four times a day (QID) | ORAL | Status: DC | PRN

## 2018-01-19 MED ORDER — NITROGLYCERIN 0.4 MG SL SUBL: 0.4000 mg | SUBLINGUAL_TABLET | SUBLINGUAL | Status: DC | PRN

## 2018-01-19 MED ORDER — ONDANSETRON HCL 4 MG/2ML IJ SOLN: 4.0000 mg | Freq: Four times a day (QID) | INTRAMUSCULAR | Status: DC | PRN

## 2018-01-19 MED ORDER — MAGNESIUM OXIDE 400 (241.3 MG) MG PO TABS
400.0000 mg | ORAL_TABLET | Freq: Two times a day (BID) | ORAL | Status: DC
  Administered 2018-01-19 – 2018-01-22 (×8): 400 mg via ORAL
  Filled 2018-01-19 (×8): qty 1

## 2018-01-19 MED ORDER — BISACODYL 5 MG PO TBEC: 5.0000 mg | DELAYED_RELEASE_TABLET | Freq: Every day | ORAL | Status: DC | PRN

## 2018-01-19 MED ORDER — HYDROCODONE-ACETAMINOPHEN 5-325 MG PO TABS: 1.0000 | ORAL_TABLET | ORAL | Status: DC | PRN

## 2018-01-19 MED ORDER — FUROSEMIDE 40 MG PO TABS
40.0000 mg | ORAL_TABLET | Freq: Every day | ORAL | Status: DC
  Administered 2018-01-19 – 2018-01-22 (×4): 40 mg via ORAL
  Filled 2018-01-19: qty 2
  Filled 2018-01-19 (×2): qty 1
  Filled 2018-01-19: qty 2

## 2018-01-19 MED ORDER — PERFLUTREN LIPID MICROSPHERE
1.0000 mL | INTRAVENOUS | Status: AC | PRN
  Administered 2018-01-19: 3 mL via INTRAVENOUS
  Filled 2018-01-19: qty 10

## 2018-01-19 MED ORDER — ACETAMINOPHEN 650 MG RE SUPP: 650.0000 mg | Freq: Four times a day (QID) | RECTAL | Status: DC | PRN

## 2018-01-19 MED ORDER — METOPROLOL SUCCINATE ER 25 MG PO TB24
12.5000 mg | ORAL_TABLET | Freq: Two times a day (BID) | ORAL | Status: DC
  Administered 2018-01-19 – 2018-01-21 (×5): 12.5 mg via ORAL
  Filled 2018-01-19 (×4): qty 0.5
  Filled 2018-01-19: qty 1
  Filled 2018-01-19: qty 0.5
  Filled 2018-01-19 (×3): qty 1

## 2018-01-19 MED ORDER — ATORVASTATIN CALCIUM 20 MG PO TABS
20.0000 mg | ORAL_TABLET | Freq: Every day | ORAL | Status: DC
  Administered 2018-01-19 – 2018-01-22 (×4): 20 mg via ORAL
  Filled 2018-01-19 (×4): qty 1

## 2018-01-19 MED ORDER — ASPIRIN EC 81 MG PO TBEC
81.0000 mg | DELAYED_RELEASE_TABLET | Freq: Every day | ORAL | Status: DC
  Administered 2018-01-19 – 2018-01-22 (×4): 81 mg via ORAL
  Filled 2018-01-19 (×4): qty 1

## 2018-01-19 MED ORDER — AMIODARONE HCL 200 MG PO TABS
400.0000 mg | ORAL_TABLET | Freq: Two times a day (BID) | ORAL | Status: DC
  Administered 2018-01-19 – 2018-01-22 (×6): 400 mg via ORAL
  Filled 2018-01-19 (×6): qty 2

## 2018-01-19 MED ORDER — INSULIN ASPART 100 UNIT/ML ~~LOC~~ SOLN
0.0000 [IU] | Freq: Three times a day (TID) | SUBCUTANEOUS | Status: DC
  Administered 2018-01-19 – 2018-01-20 (×2): 1 [IU] via SUBCUTANEOUS
  Administered 2018-01-22: 2 [IU] via SUBCUTANEOUS
  Filled 2018-01-19 (×4): qty 1

## 2018-01-19 MED ORDER — DOCUSATE SODIUM 100 MG PO CAPS
100.0000 mg | ORAL_CAPSULE | Freq: Two times a day (BID) | ORAL | Status: DC
  Administered 2018-01-19 – 2018-01-22 (×4): 100 mg via ORAL
  Filled 2018-01-19 (×6): qty 1

## 2018-01-19 NOTE — Consult Note (Signed)
Cardiology Consultation:   Patient ID: Darius Miller MRN: [id number]; DOB: Oct 08, 1945  Admit date: 01/18/2018 Date of Consult: 01/19/2018  Primary Care Provider: Gracelyn Nurse, MD Primary Cardiologist:[facility]: Dr. Etta Quill Primary Electrophysiologist: [facility]   Patient Profile:   Darius Miller is a 72 y.o. 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 Caryn Bee.  History of Present Illness:   Darius Miller is a 72 yo male with above cardiac history and established general cardiology and EP management with [facility]. He was seen in the ED and managed overnight by Dr. Mariah Milling on-call and with request for [facility] to continue care once admitted to the ICU.  CV History Per CareEverywhere Documentation:  --2009: PCI with DES to RCA 02/2008 by Dr. Alben Spittle following abnormal nuclear scan with EF of 45% --2011: Nuclear stress revealed small fixed inferior basal defect with EF 50%. --10/2014: R anterior thoracotomy for mitral valve repair 10/16/2014 by Dr. Silvestre Mesi at [facility].  --06/2017: Seen at [facility] with sustained VT at 200 bpm and treated with 3 external shocks. He was started on amiodarone and Medtronic biventricular ICD placed 06/08/2017. Per [facility] documentation, biventricular deviced was placed d/t persistent Afib with bradycardia and possible need for future ventricular pacing. R/LHC from 06/07/2017 showed high to normal pulmonary pressures, reduced CI, no significant CAD and patent RCA stents.  --07/2017: Per [facility] documentation, [place] with  transmission sent to [facility] heart showing brief episodes of VT sometimes treated with ATP, leading to termination. 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  --08/2017: Per [facility] documentation, amiodarone was decreased from 400mg  to 200mg  daily and no further ICD shocks recorded. Patient stated he remained dizzy with losartan decreased to 12.5mg  daily. He continued to have episodes of dizziness that seemingly correlated with VT and subsequent ATP treatment.  --09/2017: Per [facility] notes, amiodarone increased to 400mg  without further ATP or ICD shocks noted. ICD shocks were documented as successful in converting to SR and, as of his follow-up in 10/2017, he remained in SR with asynchronous ventricular pacing given no atrial lead and noting that patient had declined R atrial lead placement in the past. Also documented was to continue amiodarone and daily pradaxa for stroke prophylaxis. It was noted that this medication was costing him too much; however, Xarelto and Eliquis were also reportedly expensive and the patient did not want to be on warfarin so agreed to stay on pradaxa.   Patient has reportedly been compliant with his medications with most recent AICD device interrogation 1 month ago. Per most recent echo, EF 40%.  01/18/2018: Patient reported that yesterday 01/18/2018, he was completing renovations around the house including his bathroom and lifting heavy objects for this renovation. He was then eating dinner and finishing up around the house when he suddenly felt lightheaded and sat down in his recliner and did not  feel himself. He reported he soon felt diaphoretic with cold and clammy hands, as well as "started to feel nausea." He reported that he looked down at his chest and saw it "thumping  and moving," so he knew that his heart was racing. He did not feel as if his ICD shocked him. No reported chest pain, feeling as if his heart was skipping beats. EMS was called by his wife, and when EMS arrived at the seen, the patient was alert and oriented but documented as hypotensive with BP 90s / 50s and EKG showing a wide complex tachycardia with rates in the 170s. Patient reportedly received 150mg  of amiodarone in route with mentation normal.  In the ED, patient noted as hypotensive with VT and AKI: Vitals: BP 103/81, HR 174, RR 18, SpO2 98% Labs: glucose 140, Cr 1.70 (10/2017 Cr 1.1), BUN 28 EKG: Wide complex ventricular tachycardia with rate 172.  CXR: Stable calcified pleural plaques and cardiomegaly. No acute changes  Dr. Mariah Milling, [facility] Cardiology and on-call at the time of ED presentation, was contacted regarding the patient's VT and concern for defibrillator malfunction. He advised back to back amiodarone boluses, and per documentation, patient converted to paced rhythm of 50bpm following additional 150 mg IV amio bolus x2 (300mg  IV amiodarone). Repeat EKG showed paced rhythm with rate 52bpm. He was subsequently admitted to the ICU with [facility] consulted for further management and evaluation.   Medtronic pacemaker was interrogated and showed biventricular packing with parameters set for 162-200 bpm receiving treatment (burst, ramp, or shock) and +200bpm receiving shock. Per summary, patient had documented 5 episodes VT and 3 episodes SVT with no shocks or ventricular pacing noted.    Past Medical History:  Diagnosis Date  . Atrial fibrillation (HCC)   . Coronary artery disease   . Stroke [facility])      Home Medications:  Prior to Admission medications   Medication Sig Start Date End Date Taking? Authorizing Provider  acetaminophen (TYLENOL) 500 MG tablet Take 1 tablet by mouth every 8 (eight) hours as needed.   Yes [provider]  amiodarone (PACERONE) 200 MG tablet Take 400 mg  by mouth daily. 12/04/17  Yes [provider]  aspirin 81 MG tablet Take 1 tablet (81 mg total) by mouth daily. 06/04/17  Yes Tukov-Yual, Magdalene S, NP  atorvastatin (LIPITOR) 20 MG tablet Take 20 mg by mouth daily. 12/03/17  Yes [provider]  dabigatran (PRADAXA) 150 MG CAPS capsule Take 1 capsule (150 mg total) by mouth 2 (two) times daily. 06/04/17  Yes Tukov-Yual, Alroy Bailiff, NP  furosemide (LASIX) 40 MG tablet Take 1 tablet (40 mg total) by mouth daily. 06/04/17  Yes Tukov-Yual, Alroy Bailiff, NP  losartan (COZAAR) 25 MG tablet Take 0.5 tablets by mouth 2 (two) times daily. 08/23/17  Yes [provider]  magnesium oxide (MAG-OX) 400 MG tablet Take 1 tablet by mouth 2 (two) times daily. 11/08/17  Yes [provider]  metoprolol succinate (TOPROL-XL) 25 MG 24 hr tablet Take 0.5 tablets (12.5 mg total) by mouth daily. Patient taking differently: Take 12.5 mg by mouth 2 (two) times daily.  06/04/17  Yes Tukov-Yual, Alroy Bailiff, NP  nitroGLYCERIN (NITROSTAT) 0.4 MG SL tablet Place 1 tablet (0.4 mg total) under the tongue every 5 (five) minutes as needed for chest pain. 06/04/17  Yes Tukov-Yual, Alroy Bailiff, NP    Inpatient Medications: Scheduled Meds: . amiodarone  400 mg Oral Daily  . aspirin EC  81 mg Oral Daily  .  atorvastatin  20 mg Oral Daily  . dabigatran  150 mg Oral BID  . docusate sodium  100 mg Oral BID  . furosemide  40 mg Oral Daily  . insulin aspart  0-5 Units Subcutaneous QHS  . insulin aspart  0-9 Units Subcutaneous TID WC  . losartan  12.5 mg Oral BID  . magnesium oxide  400 mg Oral BID  . metoprolol succinate  12.5 mg Oral BID   Continuous Infusions:  PRN Meds: acetaminophen **OR** acetaminophen, bisacodyl, HYDROcodone-acetaminophen, nitroGLYCERIN, ondansetron **OR** ondansetron (ZOFRAN) IV, traZODone  Allergies:    Allergies  Allergen Reactions  . Flomax [Tamsulosin Hcl]     Social History:   Social History   Socioeconomic  History  . Marital status: Single    Spouse name: Not on file  . Number of children: Not on file  . Years of education: Not on file  . Highest education level: Not on file  Occupational History  . Not on file  Social Needs  . Financial resource strain: Not on file  . Food insecurity:    Worry: Not on file    Inability: Not on file  . Transportation needs:    Medical: Not on file    Non-medical: Not on file  Tobacco Use  . Smoking status: Former Smoker    Packs/day: 2.00    Years: 20.00    Pack years: 40.00    Types: Cigarettes  Substance and Sexual Activity  . Alcohol use: Not on file  . Drug use: Not on file  . Sexual activity: Not on file  Lifestyle  . Physical activity:    Days per week: Not on file    Minutes per session: Not on file  . Stress: Not on file  Relationships  . Social connections:    Talks on phone: Not on file    Gets together: Not on file    Attends religious service: Not on file    Active member of club or organization: Not on file    Attends meetings of clubs or organizations: Not on file    Relationship status: Not on file  . Intimate partner violence:    Fear of current or ex partner: Not on file    Emotionally abused: Not on file    Physically abused: Not on file    Forced sexual activity: Not on file  Other Topics Concern  . Not on file  Social History Narrative  . Not on file    Family History:   No family history on file.   ROS:  Please see the history of present illness.   All other ROS reviewed and negative.     Physical Exam/Data:   Vitals:   01/19/18 1200 01/19/18 1300 01/19/18 1322 01/19/18 1400  BP: (!) 130/91 116/64  106/65  Pulse: (!) 52 (!) 50  (!) 51  Resp: 12 14  18   Temp:   98.2 F (36.8 C)   TempSrc:   Oral   SpO2: (!) 88% 92%  (!) 89%  Weight:      Height:        Intake/Output Summary (Last 24 hours) at 01/19/2018 1553 Last data filed at 01/19/2018 1420 Gross per 24 hour  Intake 554.06 ml  Output 1025  ml  Net -470.94 ml   Filed Weights   01/18/18 2134 01/19/18 0255  Weight: 82.6 kg 82.6 kg   Body mass index is 25.4 kg/m.  General:  Well nourished, well developed, in no  acute distress and joined by his wife. HEENT: normal Neck: no JVD Endocrine:  No thryomegaly Cardiac:  AICD - Paced rhythm with RRR in 40-50s Lungs:  clear to auscultation bilaterally, no wheezing, rhonchi or rales  Abd: soft, nontender, no hepatomegaly  Ext: no edema Musculoskeletal:  No deformities, BUE and BLE strength normal and equal Skin: warm and dry  Neuro:  no focal abnormalities noted Psych:  Normal affect   EKG:  The EKG was personally reviewed and demonstrates:  As above in HPI Telemetry:  Telemetry was personally reviewed and demonstrates:  Paced with rates in 40-50s  Relevant CV Studies:  Pending echo 01/19/2018  Echocardiogram with contrast Jul 28, 2017: MODERATE LV DYSFUNCTION (LVEF 40%) MILD RV SYSTOLIC DYSFUNCTION (See above) VALVULAR REGURGITATION: TRIVIAL AR, MODERATE MR, TRIVIAL PR, MODERATE TR PROSTHETIC VALVE(S): PROSTHETIC MV RING RVSP IS ESTIMATED TO BE AT LEAST 32 mmHg PLUS RA PRESSURE (MECHANICAL VENTILATION IN USE) 3D acquisition and reconstructions were performed as part of this examination to more accurately quantify the effects of moderate or greater valve regurgitation. (post-processing on an Independent workstation).  Echocardiogram June 05, 2017: Normal left ventricular size. Mild global hypokinesis, EF 40%. Severe left atrial enlargement. Mildly enlarged and hypocontractile right ventricle Moderate right atrial enlargement. Mild mitral regurgitation. Mild tricuspid regurgitation. Mild pulmonary hypertension. Mildly dilated IVC.  EP study and radiofrequency ablation Jul 30, 2017: - NSR and first degree AVB on arrival with frequent PVCs - Frequent PVCs demonstrated positive precordial concordance with inferior and rightward axis. R/S in I. This  morphology matched the patients clinical VT morphology. This morphology was consistent with a high mitral annular location. - CS catheter was carefully placed in the CS given recent CRT implant. Quad delivered to RV apex. - ICE survey demonstrated no evidence of effusion and no evidence of aortic atheroma or contraindication to retrograde aortic access. - The Flexability ablation catheter was initially used to activation map the PVC. Due to decreased PVC burden we switched to mapping with the HD grid and used isoproterenol and burst pacing in attempts to induced PVCs.  - The PVC mapped to the aortomitral continuity. We were 54ms pre-QRS at this location. The unipolar signal was QS but the dv/dt was not very rapid, possibly suggesting a deep focus.  - This allowed [place] to easily deliver 30W. We ablated extensively in the early area. With the initial several burns, onset of RF resulted in frequent ectopy which suppressed with additional ablation. We delivered numerous 90s lesions in this area.  - After the clinical PVC was suppressed, we observed an somewhat frequent new PVC. This demonstrated positive precordial concordance with a notched QS in I and aVL and inferior rightward axis (more rightward compared to clinical PVC). With additional ablation this PVC was also suppressed.  - Over the subsequent 30 minutes we observed a single clinical PVC. We performed induction testing from the RVA. VERP 600/260, 400/230. With double extrastimulus testing the patient developed non-specific non-sustained polymorphic VT and we discontinued  testing.    05/2017 Study Conclusions - Left ventricle: The cavity size was normal. Wall thickness was   normal. Systolic function was mildly reduced. The estimated   ejection fraction was in the range of 45% to 50%. - Aortic valve: Valve area (VTI): 1.89 cm^2. Valve area (Vmax):   1.97 cm^2. Valve area (Vmean): 2.03 cm^2. - Mitral valve: There was mild regurgitation. Valve  area by   continuity equation (using LVOT flow): 1.69 cm^2. - Left atrium: The atrium  was mildly dilated. - Right atrium: The atrium was mildly dilated. - Pulmonary arteries: PA peak pressure: 39 mm Hg (S).  Laboratory Data:  Chemistry Recent Labs  Lab 01/18/18 2130 02/16/18 0325  NA 142 141  K 3.8 4.8  CL 105 105  CO2 29 29  GLUCOSE 140* 136*  BUN 28* 25*  CREATININE 1.70* 1.36*  CALCIUM 9.0 9.1  GFRNONAA 39* 51*  GFRAA 45* 59*  ANIONGAP 8 7    No results for input(s): PROT, ALBUMIN, AST, ALT, ALKPHOS, BILITOT in the last 168 hours. Hematology Recent Labs  Lab 01/18/18 2130 16-Feb-2018 0325  WBC 9.2 8.2  RBC 4.55 4.12*  HGB 14.0 12.6*  HCT 43.9 39.5  MCV 96.5 95.9  MCH 30.8 30.6  MCHC 31.9 31.9  RDW 13.8 13.9  PLT 218 181   Cardiac Enzymes Recent Labs  Lab 01/18/18 2130 February 16, 2018 0325 02/16/2018 0948  TROPONINI <0.03 0.04* 0.04*   No results for input(s): TROPIPOC in the last 168 hours.  BNPNo results for input(s): BNP, PROBNP in the last 168 hours.  DDimer No results for input(s): DDIMER in the last 168 hours.  Radiology/Studies:  US Renal  Result Date: February 16, 2018 CLINICAL DATA:  Acute renal failure. EXAM: RENAL / URINARY TRACT ULTRASOUND COMPLETE COMPARISON:  None. FINDINGS: Right Kidney: Renal measurements: 11.2 x 5.4 x 6.4 cm = volume: 199 mL . Echogenicity within normal limits. No hydronephrosis. 3.4 cm upper pole cyst. 1.5 cm echogenic focus with prominent posterior shadowing in the lateral interpolar region. Left Kidney: Renal measurements: 11.7 x 5.0 x 5.9 cm = volume: 182 mL. Echogenicity within normal limits. No hydronephrosis. 1.9 cm and 1.8 cm lower pole cyst. 5 mm echogenic focus with posterior shadowing in the lower pole. Bladder: Appears normal for degree of bladder distention. IMPRESSION: 1. No hydronephrosis. 2. Bilateral renal cysts. 3. 1.5 cm calcification in the interpolar right kidney. 4. 5 mm left lower pole calculus. Electronically Signed    By: Sebastian Ache M.D.   On: 2018/02/16 10:19   Dg Chest Portable 1 View  Result Date: 01/18/2018 CLINICAL DATA:  Dizziness.  Lightheadedness. EXAM: PORTABLE CHEST 1 VIEW COMPARISON:  September 21, 2016 FINDINGS: Calcified pleural plaques. Stable AICD. Stable cardiomegaly. The hila and mediastinum are unremarkable. No overt edema. No focal infiltrate. IMPRESSION: Stable calcified pleural plaques and cardiomegaly. No other acute abnormalities. Electronically Signed   By: Gerome Sam III M.D   On: 01/18/2018 22:01    Assessment and Plan:   1. Wide Complex Ventricular Tachycardia, s/p Medtronic biventricular AICD placement, with h/o paroxysmal Afib & VT s/p ablation, s/p mitral valve repair and on chronic anticoagulation and amiodarone - Patient followed by [facility] Cardiology / EP as documented above in HPI. ICD placement and mitral valve repair as documented above with history of Afib and VT, s/p ablation, on chronic anticoagulation with prior history of CVA, and recently increased amiodarone dosage to 400mg  daily last month. - Medtronic device interrogated as above in HPI. No shocks or pacing recorded at time of incident. Per documentation above, patient has biventricular lead placement and no atrial lead placement.  - Continue anticoagulation with pradaxa 150mg  BID. Increased amiodarone to 400mg  BID. - Plan for temporary increase in amiodarone to 400mg  BID and follow-up with Dr. Graciela Husbands [facility] EP) in the morning 11/14. This plan was discussed with both patient and wife and questions answered with both in agreement with this plan. Also discussed at this time was possibility for future ablation / repeat ablation.  Outpatient amiodarone monitoring should include baseline LFTs, TFTs and periodic PFTs, and q3-6 months BP, EKG, electrolytes. Recommend annual eye exams to monitor patient while on amiodarone. Further recommendations and evaluation pending Dr. Graciela Husbands evaluation in the AM.  2. HFrEF (EF 40%)  - CV  studies with most recent echo showing EF 40%. Pending updated echo - Monitor I/O and daily weights. Monitor renal function as on lasix with close attention to renal function and electrolytes - - documented, Wt 82.6, electrolytes at goal - Continue medical management with  blocker with hold parameters for hypotension as Cr elevation likely with hypotension. Continue losartan with recommendation to hold if further elevation in Cr or signs of renal insufficiency. Continue diuresis with lasix with daily BMET and monitoring of I/O, weights.  - Pending updated echo. If EF further declined and below 35%, may consider Entresto. Further recommendations pending echo.   3. CAD s/p stenting to RCA 2009 - No chest pain. CV studies and EKG as above. Catheterization history as above. - 11/13 Troponin minimally elevated at 0.04 and already trending down to 0.03 and likely in the setting of demand ischemia 2/2 rapid ventricular rate and AKI - Continue medical management with ASA 81mg , losartan 12.5mg   BID, toprol 12.5mg  BID, statin therapy, lasix 40mg  daily   4. HLD - 05/2017 LDL 166 and not at goal, AST and ALT stable - Goal LDL <70 - Continue statin therapy. Consider increasing atorvastatin from 20mg  to 40mg  then 80mg  unless indications otherwise and with follow-up in lipid clinic or cardiology outpatient follow-up as patient reports medication compliance but not at goal.    5. AKI, Hypotension - Hypotensive in setting of VT. BP now 130/66 - Cr elevated at presentation with CareEverywhere showing 10/2017 Cr 1.1 with etiology likely due at least in part to hypotension - Continue to monitor BP, renal function. Caution with antihypertensives as above  - Per IM  6. Anemia - Review of CareEverwhere shows chronically low normal Hgb. Chronic anticoagulation as above. - Hgb 12.6 - Monitor with CBC  7. Remainder per IM     For questions or updates, please contact [facility] Please consult  [email or web address] for contact info under     Signed, Lennon Alstrom, PA-C  01/19/2018 3:53 PM

## 2018-01-19 NOTE — Consult Note (Addendum)
PULMONARY / CRITICAL CARE MEDICINE   Name: Darius Miller MRN: [id number] DOB: June 18, 1945    ADMISSION DATE:  01/18/2018 CONSULTATION DATE:  01/19/18  REFERRING MD:  Dr. Caryn Bee  CHIEF COMPLAINT:  Wide Complex Ventricular Tachycardia  BRIEF DISCUSSION: 72 y.o. Male admitted with sustained Wide Complex Ventricular Tachycardia with conversion to paced rhythm after back to back Amiodarone boluses.  Pt has a AICD, of which Pt reports never fired during event.  HISTORY OF PRESENT ILLNESS:   72 y.o. Male with a PMH of A-fib, wide complex tachycardia s/p AICD, CAD, CVA who presents to [facility] ED on 01/18/18 with c/o lightheadedness, dizziness, and shortness of breath on exertion.  Pt reports that on 11/12 he was renovating his bathroom and then went and picked up dinner at [facility].  Upon returning home and walking his dog he reports that he became lightheaded, dizzy, and short of breath, prompting him to go inside and sit in his recliner.  Pt reports that he had a brief syncopal episode as he woke up in his recliner and his bottom teeth were out of place.  EMS was called by the pt's wife.  Pt declines any chest pain, palpitations, fever, or chills prior to this episode on 11/12.  He reports he has been compliant with his medications, and he denies that his AICD ever fired during the event.  Pt reports that his AICD was interrogated approximately 1 month ago.  Upon EMS arrival,  pt was slightly hypotensive with BP in 90's and EKG showing sustained wide complex tachycardia with HR in the 170's.  Pt was noted to be awake and mentating.  He was given 150 mg of Amiodarone by EMS. Upon arrival to ED, pt was still noted to have sustained wide complex tachycardia.  He received back-to back Amiodarone boluses after consultation with Cardiology, with conversion to a paced rhythm in the 50's.  After conversion to paced rhythm, pt's BP has improved.   He is being admitted to [facility] ICU for treatment of Sustained Wide  Complex Ventricular Tachycardia. PCCM is consulted for further management.  PAST MEDICAL HISTORY :  He  has a past medical history of Atrial fibrillation [facility]), Coronary artery disease, and Stroke (HCC).  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  Allergies  Allergen Reactions  . Flomax [Tamsulosin Hcl]     No current facility-administered medications on file prior to encounter.    Current Outpatient Medications on File Prior to Encounter  Medication Sig  . acetaminophen (TYLENOL) 500 MG tablet Take 1 tablet by mouth every 8 (eight) hours as needed.  Marland Kitchen amiodarone (PACERONE) 200 MG tablet Take 400 mg by mouth daily.  Marland Kitchen aspirin 81 MG tablet Take 1 tablet (81 mg total) by mouth daily.  Marland Kitchen atorvastatin (LIPITOR) 20 MG tablet Take 20 mg by mouth daily.  . dabigatran (PRADAXA) 150 MG CAPS capsule Take 1 capsule (150 mg total) by mouth 2 (two) times daily.  . furosemide (LASIX) 40 MG tablet Take 1 tablet (40 mg total) by mouth daily.  Marland Kitchen losartan (COZAAR) 25 MG tablet Take 0.5 tablets by mouth 2 (two) times daily.  . magnesium oxide (MAG-OX) 400 MG tablet Take 1 tablet by mouth 2 (two) times daily.  . metoprolol succinate (TOPROL-XL) 25 MG 24 hr tablet Take 0.5 tablets (12.5 mg total) by mouth daily. (Patient taking differently: Take  12.5 mg by mouth 2 (two) times daily. )  . nitroGLYCERIN (NITROSTAT) 0.4 MG SL tablet Place 1 tablet (0.4 mg total) under the tongue every 5 (five) minutes as needed for chest pain.    FAMILY HISTORY:  His has no family status information on file.    SOCIAL HISTORY: He  reports that he has quit smoking. His smoking use included cigarettes. He has a 40.00 pack-year smoking history. He does not have any smokeless tobacco history on file.  REVIEW OF SYSTEMS:   Positives in BOLD:  Pt denies any symptoms at  this time Gen: Denies fever, chills, weight change, fatigue, night sweats HEENT: Denies blurred vision, double vision, hearing loss, tinnitus, sinus congestion, rhinorrhea, sore throat, neck stiffness, dysphagia PULM: Denies shortness of breath, cough, sputum production, hemoptysis, wheezing CV: Denies chest pain, edema, orthopnea, paroxysmal nocturnal dyspnea, palpitations GI: Denies abdominal pain, nausea, vomiting, diarrhea, hematochezia, melena, constipation, change in bowel habits GU: Denies dysuria, hematuria, polyuria, oliguria, urethral discharge Endocrine: Denies hot or cold intolerance, polyuria, polyphagia or appetite change Derm: Denies rash, dry skin, scaling or peeling skin change Heme: Denies easy bruising, bleeding, bleeding gums Neuro: Denies headache, numbness, weakness, slurred speech, loss of memory or consciousness   SUBJECTIVE:  Pt reports he is feeling better Denies chest pain, palpitations, shortness of breath, lightheadedness, or dizziness On room air  VITAL SIGNS: BP 102/69   Pulse (!) 51   Temp 98.2 F (36.8 C) (Oral)   Resp 14   Ht 5\' 11"  (1.803 m)   Wt 82.6 kg   SpO2 93%   BMI 25.38 kg/m   HEMODYNAMICS:    VENTILATOR SETTINGS:    INTAKE / OUTPUT: No intake/output data recorded.  PHYSICAL EXAMINATION: General:  Acutely ill appearing male, sitting in bed, on room air, in NAD Neuro:  Awake, A&O x4, follows commands, no focal deficits, clear speech HEENT:  Atraumatic, normocephalic, neck supple, no JVD Cardiovascular:  Paced rhythm, no M/R/G, 2+ pulses throughout Lungs:  Clear bilaterally, no wheezing, even, nonlabored, normal effort Abdomen:  Soft, nontender, nondistended, BS+ x4 Musculoskeletal:  No deformities, normal bulk and tone, No edema Skin:  Warm/dry. No obvious rashes, lesions, or ulcerations  LABS:  BMET Recent Labs  Lab 01/18/18 2130  NA 142  K 3.8  CL 105  CO2 29  BUN 28*  CREATININE 1.70*  GLUCOSE 140*     Electrolytes Recent Labs  Lab 01/18/18 2130  CALCIUM 9.0    CBC Recent Labs  Lab 01/18/18 2130  WBC 9.2  HGB 14.0  HCT 43.9  PLT 218    Coag's No results for input(s): APTT, INR in the last 168 hours.  Sepsis Markers No results for input(s): LATICACIDVEN, PROCALCITON, O2SATVEN in the last 168 hours.  ABG No results for input(s): PHART, PCO2ART, PO2ART in the last 168 hours.  Liver Enzymes No results for input(s): AST, ALT, ALKPHOS, BILITOT, ALBUMIN in the last 168 hours.  Cardiac Enzymes Recent Labs  Lab 01/18/18 2130  TROPONINI <0.03    Glucose No results for input(s): GLUCAP in the last 168 hours.  Imaging Dg Chest Portable 1 View  Result Date: 01/18/2018 CLINICAL DATA:  Dizziness.  Lightheadedness. EXAM: PORTABLE CHEST 1 VIEW COMPARISON:  September 21, 2016 FINDINGS: Calcified pleural plaques. Stable AICD. Stable cardiomegaly. The hila and mediastinum are unremarkable. No overt edema. No focal infiltrate. IMPRESSION: Stable calcified pleural plaques and cardiomegaly. No other acute abnormalities. Electronically Signed   By: Gerome Sam III M.D  On: 01/18/2018 22:01     STUDIES:  Echocardiogram 01/19/18>> Renal US 01/19/18>>  CULTURES:  ANTIBIOTICS:  SIGNIFICANT EVENTS: 01/18/18>> Admission to [facility] Stepdown  LINES/TUBES:   ASSESSMENT / PLAN:  PULMONARY A: No acute issues P:   Supplemental O2 as needed to maintain O2 sats > 92% Follow intermittent CXR and ABG as needed  CARDIOVASCULAR A:  Ventricular Tachycardia -Troponin negative x1 >>  Hx: CAD, A-fib, V-tach s/p AICD placement P:  Cardiac monitoring Maintain MAP >65 Cardiology consulted, appreciate input Will likely need AICD interrogated Continue home Amiodarone PO dose Continue home Lasix, Losartan, and Metoprolol ECHO pending Trend Troponin Check TSH and thyroid panel  RENAL A:   AKI P:   Monitor I&O's / urinary output Follow BMP Ensure adequate renal  perfusion Avoid nephrotoxic agents as able Replace electrolytes as indicated Renal Ultrasound pending  GASTROINTESTINAL A:   No acute issues P:   Diet as tolerated  HEMATOLOGIC A:   No acute issues P:  Monitor for S/Sx of bleeding Trend CBC Continue home Pradaxa  for VTE Prophylaxis  Transfuse for Hgb <7   INFECTIOUS A:   No acute issues P:   Monitor fever curve Trend WBC's  ENDOCRINE A:   Hyperglycemia P:   CBG's SSI Follow ICU Hypo/Hyperglycemia protocol Will check TSH  NEUROLOGIC A:   No acute issues P:   Provide supportive care Avoid sedating meds as able Lights on during the day Promote normal sleep/wake cycle   FAMILY  - Updates: Updated pt and his wife at bedside 01/19/18.  Pt is a Full Code.  - Inter-disciplinary family meet or Palliative Care meeting due by:  01/25/18   Harlon Ditty, AGACNP-BC [facility] Pager: [phone] Cell: [phone]  01/19/2018, 12:11 AM   STAFF NOTE: I, Dr Roseanne Reno have personally reviewed patient's available data, including medical history, events of note, physical examination and test results as part of my evaluation. I have discussed with resident/NP and other care providers such as pharmacist, RN and RRT.  In addition,  I personally evaluated patient and elicited key findings of   Patient admitted overnight with V. tach, improved with amiodarone boluses, currently in sinus bradycardia, awaiting cardiology consult, spoke with Dr. and he will evaluate the patient later on today  -Patient's magnesium is 2.1 -Patient potassium is 4.8 - Patient is denying any significant complaint - Creatinine improved to 1.36  Assessment: Wide-complex tachycardia, AICD did not fire  Plan: - Follow cardiology lead -IV fluids - Continue p.o. amiodarone - Continue p.o. metoprolol and Pradaxa for underlying A. fib  Acute renal insufficiency: Improved with IV fluids, follow US kidneys -5  mm left kidney calculus but no evidence of any hydronephrosis  -Quality metrics addressed in the rounds -We will monitor in the ICU until seen by cardiology and decide further course of action as per cardiology.  CCM time 32 min

## 2018-01-19 NOTE — Progress Notes (Signed)
Troponin level went to 0.04.  NP made aware.  Pt has cardiology consult and an order for an ECHO.  No new orders were given.

## 2018-01-19 NOTE — Progress Notes (Signed)
Informed by Dr. Roseanne Reno, Intensivist, family had concerns about care received in ED, and wanted another cardiologist(Dr. Juliann Pares was assigned cardiologist) because of a past experience. Dr. Sherryll Burger contacted Dr End who agreed to see patient.Dr. Sherryll Burger discussed this plan with patient and wife, and they were in agreement. He also explained it would probably be after 1700 before Dr. Okey Dupre would see the patient, but if there was a cardiac emergency, Dr. Okey Dupre would be available.

## 2018-01-19 NOTE — H&P (Signed)
[facility] [place] at [facility]   PATIENT NAME: Darius Miller    MR#:  [id number]  DATE OF BIRTH:  May 02, 1945  DATE OF ADMISSION:  01/18/2018  PRIMARY CARE PHYSICIAN: Gracelyn Nurse, MD   REQUESTING/REFERRING PHYSICIAN:   CHIEF COMPLAINT:   Chief Complaint  Patient presents with  . Irregular Heart Beat    HISTORY OF PRESENT ILLNESS: Darius Miller  is a 72 y.o. male with a known history of wide-complex tachycardia status post AICD placement, atrial fibrillation, CAD, CVA. Patient was brought to emergency room via EMS for sustained V. Tach. Patient reports that he had lightheadedness and shortness of breath with exertion in the past 8 hours.  He even had a brief syncopal episode while he was sitting down in a chair.  He denies any falls or trauma.  No chest pain or palpitations.  No ICD firing.  His AICD was interrogated 1 month ago and his amiodarone was increased from 200 mg to 400 mg daily at time. While in the emergency room patient was noted to be alert and oriented x4, with blood pressure at 90/50.  EKG showed sustained wide-complex tachycardia in the 170s.  He was treated with 4 amiodarone boluses and patient eventually converted to paced rhythm; his BP improved. Blood test done emergency room, including CBC and CMP and troponin level are grossly unremarkable except for elevated creatinine level at 1.7 and elevated BUN level 28.  Chest x-ray is negative for acute abnormalities. Patient is admitted to intensive care unit for further management.  PAST MEDICAL HISTORY:   Past Medical History:  Diagnosis Date  . Atrial fibrillation (HCC)   . Coronary artery disease   . Stroke [facility])     PAST SURGICAL HISTORY: Mitral valve repair surgery and coronary artery stent placement.  SOCIAL HISTORY:  Social History   Tobacco Use  . Smoking status: Former Smoker    Packs/day: 2.00    Years: 20.00    Pack years: 40.00    Types: Cigarettes  Substance Use  Topics  . Alcohol use: Not on file    FAMILY HISTORY: Hypertension and hyperlipidemia in both parents.  DRUG ALLERGIES:  Allergies  Allergen Reactions  . Flomax [Tamsulosin Hcl]     REVIEW OF SYSTEMS:   CONSTITUTIONAL: No fever, fatigue or weakness.  EYES: No changes in vision.  EARS, NOSE, AND THROAT: No tinnitus or ear pain.  RESPIRATORY: No cough, shortness of breath, wheezing or hemoptysis.  CARDIOVASCULAR: Positive for lightheadedness and syncope.  No chest pain, orthopnea, edema.  GASTROINTESTINAL: No nausea, vomiting, diarrhea or abdominal pain.  GENITOURINARY: No dysuria, hematuria.  ENDOCRINE: No polyuria, nocturia. HEMATOLOGY: No bleeding. SKIN: No rash or lesion. MUSCULOSKELETAL: No joint pain at this time.   NEUROLOGIC: No focal weakness.  PSYCHIATRY: No anxiety or depression.   MEDICATIONS AT HOME:  Prior to Admission medications   Medication Sig Start Date End Date Taking? Authorizing Provider  acetaminophen (TYLENOL) 500 MG tablet Take 1 tablet by mouth every 8 (eight) hours as needed.   Yes [provider]  amiodarone (PACERONE) 200 MG tablet Take 400 mg by mouth daily. 12/04/17  Yes [provider]  aspirin 81 MG tablet Take 1 tablet (81 mg total) by mouth daily. 06/04/17  Yes Tukov-Yual, Magdalene S, NP  atorvastatin (LIPITOR) 20 MG tablet Take 20 mg by mouth daily. 12/03/17  Yes [provider]  dabigatran (PRADAXA) 150 MG CAPS capsule Take 1 capsule (150 mg total) by mouth  2 (two) times daily. 06/04/17  Yes Tukov-Yual, Alroy Bailiff, NP  furosemide (LASIX) 40 MG tablet Take 1 tablet (40 mg total) by mouth daily. 06/04/17  Yes Tukov-Yual, Alroy Bailiff, NP  losartan (COZAAR) 25 MG tablet Take 0.5 tablets by mouth 2 (two) times daily. 08/23/17  Yes [provider]  magnesium oxide (MAG-OX) 400 MG tablet Take 1 tablet by mouth 2 (two) times daily. 11/08/17  Yes [provider]  metoprolol succinate (TOPROL-XL) 25 MG 24 hr tablet  Take 0.5 tablets (12.5 mg total) by mouth daily. Patient taking differently: Take 12.5 mg by mouth 2 (two) times daily.  06/04/17  Yes Tukov-Yual, Alroy Bailiff, NP  nitroGLYCERIN (NITROSTAT) 0.4 MG SL tablet Place 1 tablet (0.4 mg total) under the tongue every 5 (five) minutes as needed for chest pain. 06/04/17  Yes Tukov-Yual, Magdalene S, NP      PHYSICAL EXAMINATION:   VITAL SIGNS: Blood pressure 102/69, pulse (!) 51, temperature 98.2 F (36.8 C), temperature source Oral, resp. rate 14, height 5\' 11"  (1.803 m), weight 82.6 kg, SpO2 93 %.  GENERAL:  72 y.o.-year-old patient lying in the bed with no acute distress.  EYES: Pupils equal, round, reactive to light and accommodation. No scleral icterus. Extraocular muscles intact.  HEENT: Head atraumatic, normocephalic. Oropharynx and nasopharynx clear.  NECK:  Supple, no jugular venous distention. No thyroid enlargement, no tenderness.  LUNGS: Normal breath sounds bilaterally, no wheezing, rales,rhonchi or crepitation. No use of accessory muscles of respiration.  CARDIOVASCULAR: S1, S2 normal. No S3/S4.  ABDOMEN: Soft, nontender, nondistended. Bowel sounds present. No organomegaly or mass.  EXTREMITIES: No pedal edema, cyanosis, or clubbing.  NEUROLOGIC: Cranial nerves II through XII are intact. Muscle strength 5/5 in all extremities. Sensation intact.   PSYCHIATRIC: The patient is alert and oriented x 3.  SKIN: No obvious rash, lesion, or ulcer.   LABORATORY PANEL:   CBC Recent Labs  Lab 01/18/18 2130  WBC 9.2  HGB 14.0  HCT 43.9  PLT 218  MCV 96.5  MCH 30.8  MCHC 31.9  RDW 13.8   ------------------------------------------------------------------------------------------------------------------  Chemistries  Recent Labs  Lab 01/18/18 2130  NA 142  K 3.8  CL 105  CO2 29  GLUCOSE 140*  BUN 28*  CREATININE 1.70*  CALCIUM 9.0    ------------------------------------------------------------------------------------------------------------------ estimated creatinine clearance is 42.4 mL/min (A) (by C-G formula based on SCr of 1.7 mg/dL (H)). ------------------------------------------------------------------------------------------------------------------ No results for input(s): TSH, T4TOTAL, T3FREE, THYROIDAB in the last 72 hours.  Invalid input(s): FREET3   Coagulation profile No results for input(s): INR, PROTIME in the last 168 hours. ------------------------------------------------------------------------------------------------------------------- No results for input(s): DDIMER in the last 72 hours. -------------------------------------------------------------------------------------------------------------------  Cardiac Enzymes Recent Labs  Lab 01/18/18 2130  TROPONINI <0.03   ------------------------------------------------------------------------------------------------------------------ Invalid input(s): POCBNP  ---------------------------------------------------------------------------------------------------------------  Urinalysis    Component Value Date/Time   COLORURINE YELLOW (A) 06/04/2017 1136   APPEARANCEUR CLEAR (A) 06/04/2017 1136   LABSPEC 1.014 06/04/2017 1136   PHURINE 6.0 06/04/2017 1136   GLUCOSEU NEGATIVE 06/04/2017 1136   HGBUR MODERATE (A) 06/04/2017 1136   BILIRUBINUR NEGATIVE 06/04/2017 1136   KETONESUR NEGATIVE 06/04/2017 1136   PROTEINUR NEGATIVE 06/04/2017 1136   NITRITE NEGATIVE 06/04/2017 1136   LEUKOCYTESUR NEGATIVE 06/04/2017 1136     RADIOLOGY: Dg Chest Portable 1 View  Result Date: 01/18/2018 CLINICAL DATA:  Dizziness.  Lightheadedness. EXAM: PORTABLE CHEST 1 VIEW COMPARISON:  September 21, 2016 FINDINGS: Calcified pleural plaques. Stable AICD. Stable cardiomegaly. The hila and mediastinum are unremarkable. No  overt edema. No focal infiltrate. IMPRESSION:  Stable calcified pleural plaques and cardiomegaly. No other acute abnormalities. Electronically Signed   By: Gerome Sam III M.D   On: 01/18/2018 22:01    EKG: Orders placed or performed during the hospital encounter of 01/18/18  . EKG 12-Lead  . EKG 12-Lead  . EKG 12-Lead  . EKG 12-Lead    IMPRESSION AND PLAN:  1.  Sustained V. Tach, with conversion to paced rhythm, status post amiodarone IV.  Continue to monitor on telemetry.  Continue home medications including amiodarone.  Cardiology is consulted for further evaluation and treatment.  Will check 2D echo for further evaluation of cardiac function. 2.  Syncope, likely secondary to ventricular tachycardia.  To to monitor clinically closely while treating the underlying disorder. 3.  Acute renal failure, possibly related to low blood pressure episode from earlier today.  Continue to monitor kidney function closely and avoid nephrotoxic medications. 4.  Hypertension, stable, resume home medications. 5.  Paroxysmal atrial fibrillation, currently paced rhythm.  Continue amiodarone and beta-blocker.  Continue chronic anticoagulation with Pradaxa.   All the records are reviewed and case discussed with ED provider. Management plans discussed with the patient, family and they are in agreement.  CODE STATUS: Full Code Status History    Date Active Date Inactive Code Status Order ID Comments User Context   06/03/2017 1826 06/05/2017 0123 Full Code [id number]  Salary, Evelena Asa, MD Inpatient       TOTAL TIME TAKING CARE OF THIS PATIENT: 55 minutes.    Cammy Copa M.D on 01/19/2018 at 12:04 AM  Between 7am to 6pm - Pager - [phone]  After 6pm go to [email or web address] - password [email or web address] [facility] [place] [facility]  [phone]  CC: Primary care physician; Gracelyn Nurse, MD

## 2018-01-19 NOTE — Progress Notes (Signed)
*  PRELIMINARY RESULTS* Echocardiogram 2D Echocardiogram has been performed. Definity image enhancer was administered intravenously.  Darius Miller 01/19/2018, 10:19 AM

## 2018-01-19 NOTE — Progress Notes (Signed)
-  Cardiology suggested to continue with the same medication they might increase amiodarone -If doing well patient can be transferred out -We will follow-up official note and determine further course of action

## 2018-01-20 ENCOUNTER — Encounter: Payer: Self-pay | Admitting: Internal Medicine

## 2018-01-20 DIAGNOSIS — R000 Tachycardia, unspecified: Secondary | ICD-10-CM

## 2018-01-20 LAB — CBC WITH DIFFERENTIAL/PLATELET
ABS IMMATURE GRANULOCYTES: 0.02 10*3/uL (ref 0.00–0.07)
BASOS ABS: 0 10*3/uL (ref 0.0–0.1)
Basophils Relative: 1 %
Eosinophils Absolute: 0.2 10*3/uL (ref 0.0–0.5)
Eosinophils Relative: 3 %
HCT: 41.5 % (ref 39.0–52.0)
HEMOGLOBIN: 13.2 g/dL (ref 13.0–17.0)
Immature Granulocytes: 0 %
LYMPHS ABS: 1.5 10*3/uL (ref 0.7–4.0)
LYMPHS PCT: 22 %
MCH: 30.1 pg (ref 26.0–34.0)
MCHC: 31.8 g/dL (ref 30.0–36.0)
MCV: 94.5 fL (ref 80.0–100.0)
MONOS PCT: 9 %
Monocytes Absolute: 0.6 10*3/uL (ref 0.1–1.0)
NEUTROS ABS: 4.4 10*3/uL (ref 1.7–7.7)
Neutrophils Relative %: 65 %
Platelets: 183 10*3/uL (ref 150–400)
RBC: 4.39 MIL/uL (ref 4.22–5.81)
RDW: 13.7 % (ref 11.5–15.5)
WBC: 6.8 10*3/uL (ref 4.0–10.5)
nRBC: 0 % (ref 0.0–0.2)

## 2018-01-20 LAB — COMPREHENSIVE METABOLIC PANEL
ALBUMIN: 3.7 g/dL (ref 3.5–5.0)
ALK PHOS: 77 U/L (ref 38–126)
ALT: 33 U/L (ref 0–44)
ANION GAP: 4 — AB (ref 5–15)
AST: 31 U/L (ref 15–41)
BUN: 20 mg/dL (ref 8–23)
CALCIUM: 9 mg/dL (ref 8.9–10.3)
CO2: 31 mmol/L (ref 22–32)
CREATININE: 1.08 mg/dL (ref 0.61–1.24)
Chloride: 105 mmol/L (ref 98–111)
GFR calc Af Amer: >60 mL/min (ref >60)
GFR calc non Af Amer: >60 mL/min (ref >60)
GLUCOSE: 92 mg/dL (ref 70–99)
Potassium: 4 mmol/L (ref 3.5–5.1)
SODIUM: 140 mmol/L (ref 135–145)
TOTAL PROTEIN: 6.5 g/dL (ref 6.5–8.1)
Total Bilirubin: 0.8 mg/dL (ref 0.3–1.2)

## 2018-01-20 LAB — THYROID PANEL WITH TSH
Free Thyroxine Index: 2.6 (ref 1.2–4.9)
T3 Uptake Ratio: 32 % (ref 24–39)
T4, Total: 8 ug/dL (ref 4.5–12.0)
TSH: 1.85 u[IU]/mL (ref 0.450–4.500)

## 2018-01-20 LAB — MAGNESIUM: Magnesium: 2.1 mg/dL (ref 1.7–2.4)

## 2018-01-20 LAB — ECHOCARDIOGRAM COMPLETE
Height: 71 in
Weight: 2913.6 oz

## 2018-01-20 LAB — GLUCOSE, CAPILLARY
GLUCOSE-CAPILLARY: 86 mg/dL (ref 70–99)
GLUCOSE-CAPILLARY: 99 mg/dL (ref 70–99)
GLUCOSE-CAPILLARY: 87 mg/dL (ref 70–99)
Glucose-Capillary: 148 mg/dL — ABNORMAL HIGH (ref 70–99)

## 2018-01-20 MED ORDER — MEXILETINE HCL 200 MG PO CAPS
200.0000 mg | ORAL_CAPSULE | Freq: Two times a day (BID) | ORAL | Status: DC
  Administered 2018-01-20 – 2018-01-22 (×5): 200 mg via ORAL
  Filled 2018-01-20 (×7): qty 1

## 2018-01-20 NOTE — Progress Notes (Signed)
[employer] - [place] [facility]   PATIENT NAME: Kameron Blethen    MR#:  [id number]  DATE OF BIRTH:  1945/10/23  SUBJECTIVE:  CHIEF COMPLAINT:   Chief Complaint  Patient presents with  . Irregular Heart Beat   Came with V tech. After amiodarone injections, now in NSR, no complains.  REVIEW OF SYSTEMS:  CONSTITUTIONAL: No fever, fatigue or weakness.  EYES: No blurred or double vision.  EARS, NOSE, AND THROAT: No tinnitus or ear pain.  RESPIRATORY: No cough, shortness of breath, wheezing or hemoptysis.  CARDIOVASCULAR: No chest pain, orthopnea, edema.  GASTROINTESTINAL: No nausea, vomiting, diarrhea or abdominal pain.  GENITOURINARY: No dysuria, hematuria.  ENDOCRINE: No polyuria, nocturia,  HEMATOLOGY: No anemia, easy bruising or bleeding SKIN: No rash or lesion. MUSCULOSKELETAL: No joint pain or arthritis.   NEUROLOGIC: No tingling, numbness, weakness.  PSYCHIATRY: No anxiety or depression.   ROS  DRUG ALLERGIES:   Allergies  Allergen Reactions  . Flomax [Tamsulosin Hcl]     VITALS:  Blood pressure 122/75, pulse (!) 52, temperature 97.9 F (36.6 C), resp. rate 17, height 5\' 11"  (1.803 m), weight 81.5 kg, SpO2 97 %.  PHYSICAL EXAMINATION:  GENERAL:  72 y.o.-year-old patient lying in the bed with no acute distress.  EYES: Pupils equal, round, reactive to light and accommodation. No scleral icterus. Extraocular muscles intact.  HEENT: Head atraumatic, normocephalic. Oropharynx and nasopharynx clear.  NECK:  Supple, no jugular venous distention. No thyroid enlargement, no tenderness.  LUNGS: Normal breath sounds bilaterally, no wheezing, rales,rhonchi or crepitation. No use of accessory muscles of respiration.  CARDIOVASCULAR: S1, S2 normal. No murmurs, rubs, or gallops. AICD in place. ABDOMEN: Soft, nontender, nondistended. Bowel sounds present. No organomegaly or mass.  EXTREMITIES: No pedal edema, cyanosis, or clubbing.  NEUROLOGIC: Cranial nerves  II through XII are intact. Muscle strength 5/5 in all extremities. Sensation intact. Gait not checked.  PSYCHIATRIC: The patient is alert and oriented x 3.  SKIN: No obvious rash, lesion, or ulcer.   Physical Exam LABORATORY PANEL:   CBC Recent Labs  Lab 01/20/18 0327  WBC 6.8  HGB 13.2  HCT 41.5  PLT 183   ------------------------------------------------------------------------------------------------------------------  Chemistries  Recent Labs  Lab 01/20/18 0327  NA 140  K 4.0  CL 105  CO2 31  GLUCOSE 92  BUN 20  CREATININE 1.08  CALCIUM 9.0  MG 2.1  AST 31  ALT 33  ALKPHOS 77  BILITOT 0.8   ------------------------------------------------------------------------------------------------------------------  Cardiac Enzymes Recent Labs  Lab 01/19/18 0948 01/19/18 1531  TROPONINI 0.04* 0.03*   ------------------------------------------------------------------------------------------------------------------  RADIOLOGY:  US Renal  Result Date: 01/19/2018 CLINICAL DATA:  Acute renal failure. EXAM: RENAL / URINARY TRACT ULTRASOUND COMPLETE COMPARISON:  None. FINDINGS: Right Kidney: Renal measurements: 11.2 x 5.4 x 6.4 cm = volume: 199 mL . Echogenicity within normal limits. No hydronephrosis. 3.4 cm upper pole cyst. 1.5 cm echogenic focus with prominent posterior shadowing in the lateral interpolar region. Left Kidney: Renal measurements: 11.7 x 5.0 x 5.9 cm = volume: 182 mL. Echogenicity within normal limits. No hydronephrosis. 1.9 cm and 1.8 cm lower pole cyst. 5 mm echogenic focus with posterior shadowing in the lower pole. Bladder: Appears normal for degree of bladder distention. IMPRESSION: 1. No hydronephrosis. 2. Bilateral renal cysts. 3. 1.5 cm calcification in the interpolar right kidney. 4. 5 mm left lower pole calculus. Electronically Signed   By: Sebastian Ache M.D.   On: 01/19/2018 10:19   Dg Chest Portable  1 View  Result Date: 01/18/2018 CLINICAL DATA:   Dizziness.  Lightheadedness. EXAM: PORTABLE CHEST 1 VIEW COMPARISON:  September 21, 2016 FINDINGS: Calcified pleural plaques. Stable AICD. Stable cardiomegaly. The hila and mediastinum are unremarkable. No overt edema. No focal infiltrate. IMPRESSION: Stable calcified pleural plaques and cardiomegaly. No other acute abnormalities. Electronically Signed   By: Gerome Sam III M.D   On: 01/18/2018 22:01    ASSESSMENT AND PLAN:   Active Problems:   V-tach (HCC)   Chronic systolic heart failure (HCC)  1.  Sustained V. Tach, with conversion to paced rhythm, status post amiodarone IV.  Continue to monitor on telemetry.  Continue home medications including amiodarone.  Cardiology is consulted for further evaluation and treatment.  Will check 2D echo for further evaluation of cardiac function.  Mg and TSH is checked- normal. 2.  Syncope, likely secondary to ventricular tachycardia.  To to monitor clinically closely while treating the underlying disorder. 3.  Acute renal failure, possibly related to low blood pressure episode from earlier today.  Continue to monitor kidney function closely and avoid nephrotoxic medications. 4.  Hypertension, stable, resume home medications. 5.  Paroxysmal atrial fibrillation, currently paced rhythm.  Continue amiodarone and beta-blocker.  Continue chronic anticoagulation with Pradaxa.   All the records are reviewed and case discussed with Care Management/Social Workerr. Management plans discussed with the patient, family and they are in agreement.  CODE STATUS: Full.  TOTAL TIME TAKING CARE OF THIS PATIENT: 35 minutes.     POSSIBLE D/C IN 1-2 DAYS, DEPENDING ON CLINICAL CONDITION.   Altamese Dilling M.D on 01/20/2018   Between 7am to 6pm - Pager - [phone]  After 6pm go to [email or web address] - password [email or web address]  [facility]  Office  [phone]  CC: Primary care physician; Gracelyn Nurse, MD  Note: This dictation was prepared  with Dragon dictation along with smaller phrase technology. Any transcriptional errors that result from this process are unintentional.

## 2018-01-20 NOTE — Evaluation (Signed)
Physical Therapy Evaluation Patient Details Name: Darius Miller MRN: [id number] DOB: 1946-03-08 Today's Date: 01/20/2018   History of Present Illness  Pt is a 72 year old male admitted for V-tach and systolic heart failure following c/o being light headed and SOB with exertion.  PMH includes stroke, ICD placement, mitral valve repair, CAD and atrial fibrillation.  Clinical Impression  Pt is a 72 year old male who lives in a one story home with his wife.  He is independent without use of AD at baseline.  Pt able to perform bed mobility mod I and stand from bed with min use of UE's for support.  Pt comfortable walking short distances without AD but may need to use a SPC, which he has at home, for longer distances.  He was able to ambulate 30 ft without AD but with mild O2 desat.  Pt presented with balance deficits during higher level static standing activity.  He will continue to benefit from skilled PT with focus on tolerance to activity, balance and use of AD for ambulation if needed.    Follow Up Recommendations Outpatient PT(Also discussed [facility]'s Fore[facility] program as an option for maintenance)    Equipment Recommendations  None recommended by PT    Recommendations for Other Services       Precautions / Restrictions        Mobility  Bed Mobility Overal bed mobility: Modified Independent             General bed mobility comments: Increased time  Transfers Overall transfer level: Needs assistance Equipment used: None Transfers: Sit to/from Stand Sit to Stand: Supervision         General transfer comment: Able to rise from bedside with bilateral use of UE but with little difficulty.  Ambulation/Gait Ambulation/Gait assistance: Min guard Gait Distance (Feet): 30 Feet Assistive device: None     Gait velocity interpretation: 1.31 - 2.62 ft/sec, indicative of limited community ambulator General Gait Details: Pt able to walk along bedside and in room without AD and  showing no symptoms of fatigue.    Stairs            Wheelchair Mobility    Modified Rankin (Stroke Patients Only)       Balance Overall balance assessment: Independent;Needs assistance             Standing balance comment: quiet stance:10 sec, feet together: 8 sec, tandem B LE: 4 sec; able to bend to lift object from floor without UE assistance for stability.                             Pertinent Vitals/Pain Pain Assessment: No/denies pain    Home Living Family/patient expects to be discharged to:: Private residence Living Arrangements: Spouse/significant other Available Help at Discharge: Family;Available 24 hours/day Type of Home: House Home Access: Level entry     Home Layout: One level Home Equipment: Cane - single point;Walker - 2 wheels      Prior Function Level of Independence: Independent         Comments: Community ambulator, walks his dogs regularly.     Hand Dominance   Dominant Hand: Right    Extremity/Trunk Assessment   Upper Extremity Assessment Upper Extremity Assessment: Overall WFL for tasks assessed(Grossly 4+/5 bilaterally.)    Lower Extremity Assessment Lower Extremity Assessment: Overall WFL for tasks assessed(Grossly 4+/5 bilaterally.)    Cervical / Trunk Assessment Cervical / Trunk Assessment:  Normal  Communication   Communication: No difficulties  Cognition Arousal/Alertness: Awake/alert Behavior During Therapy: WFL for tasks assessed/performed Overall Cognitive Status: Within Functional Limits for tasks assessed                                        General Comments      Exercises     Assessment/Plan    PT Assessment Patient needs continued PT services  PT Problem List Decreased balance;Decreased activity tolerance       PT Treatment Interventions DME instruction;Therapeutic activities;Therapeutic exercise;Balance training;Functional mobility training    PT Goals (Current  goals can be found in the Care Plan section)  Acute Rehab PT Goals Patient Stated Goal: To return to walking his dogs. PT Goal Formulation: With patient Time For Goal Achievement: 02/03/18 Potential to Achieve Goals: Good    Frequency Min 2X/week   Barriers to discharge        Co-evaluation               AM-PAC PT "6 Clicks" Daily Activity  Outcome Measure Difficulty turning over in bed (including adjusting bedclothes, sheets and blankets)?: None Difficulty moving from lying on back to sitting on the side of the bed? : A Little Difficulty sitting down on and standing up from a chair with arms (e.g., wheelchair, bedside commode, etc,.)?: None Help needed moving to and from a bed to chair (including a wheelchair)?: None Help needed walking in hospital room?: A Little Help needed climbing 3-5 steps with a railing? : A Little 6 Click Score: 21    End of Session Equipment Utilized During Treatment: Gait belt Activity Tolerance: Patient tolerated treatment well Patient left: in bed;with call bell/phone within reach   PT Visit Diagnosis: Unsteadiness on feet (R26.81)    Time: 3244-0102 PT Time Calculation (min) (ACUTE ONLY): 15 min   Charges:   PT Evaluation $PT Eval Low Complexity: 1 Low          Glenetta Hew, PT, DPT  Glenetta Hew 01/20/2018, 2:53 PM

## 2018-01-20 NOTE — Progress Notes (Signed)
Per Dr End give metoprolol. Aware of patients heart rate. Patient has AICD.

## 2018-01-20 NOTE — Consult Note (Signed)
ELECTROPHYSIOLOGY CONSULT NOTE  Patient ID: Darius Miller, MRN: [id number], DOB/AGE: Sep 07, 1945 72 y.o. Admit date: 01/18/2018 Date of Consult: 01/20/2018  Primary Physician: Gracelyn Nurse, MD Primary Cardiologist: Darius Miller is a 72 y.o. male who is being seen today for the evaluation of VT at the request of Dr CE.   Chief Complaint: VT    HPI Darius Miller is a 72 y.o. male admitted with ventricular tachycardia.   He is status post mitral valve repair at [facility] 2016. Note medical record describes bypass but surgical discharge summary LIMITED to MV repair   He has a history of ventricular tachycardia-sustained 4/19.  At that time catheterization demonstrated no obstructive disease; patent RCA stents were appreciated; echocardiogram demonstrated ejection fraction 40%.  Echo 11/19>>50-55%   He was treated with amiodarone and underwent biventricular device implantation without any atrial lead  5/19  suffered VT storm; per pt was found to have low K.  Underwent ablation of PVCs.  EP note summary was reviewed.  Ablation undertaken at the aorto mitral continuity with a significant decrease burden of PVCs thereafter.  He was noninducible to sustained VT  Had recurrent ventricular tachycardia 9/19 prompting up titration of amiodarone 200--400 mg a day.   Subsequently he has had recurrent ventricular tachycardia successfully treated with ATP.  Noted thereafter to have had A-V dissociation with a synchronous ventricular pacing.  Apparently has declined device upgrade to receive an atrial lead.   He has a history of atrial fibrillation, presumed to be chronic but then converted with amio and shock     Afterload reduction has been limited by hypotension according to the [facility] notes.  He denies dizziness apart from episodes of ventricular tachycardia.  No chest pain.  Some edema right greater than left no nocturnal dyspnea orthopnea.  Mild dyspnea on exertion.  No  bleeding.    Past Medical History:  Diagnosis Date  . Atrial fibrillation (HCC)   . Coronary artery disease  s/p RCA stents   . History of mitral valve repair   . 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 Kidney stones   . Stroke (HCC)   . Ventricular tachycardia [facility])       Surgical History:   Past Surgical History:  Procedure Laterality Date  . CARDIAC CATHETERIZATION    . CORONARY ANGIOPLASTY    . Coronary stent 2009  2009  . ICD IMPLANT    . MITRAL VALVE REPAIR  October 16, 2014     Home Meds: Prior to Admission medications   Medication Sig Start Date End Date Taking? Authorizing Provider  acetaminophen (TYLENOL) 500 MG tablet Take 1 tablet by mouth every 8 (eight) hours as needed.   Yes [provider]  amiodarone (PACERONE) 200 MG tablet Take 400 mg by mouth daily. 12/04/17  Yes [provider]  aspirin 81 MG tablet Take 1 tablet (81 mg total) by mouth daily. 06/04/17  Yes Tukov-Yual, Magdalene S, NP  atorvastatin (LIPITOR) 20 MG tablet Take 20 mg by mouth daily. 12/03/17  Yes [provider]  dabigatran (PRADAXA) 150 MG CAPS capsule Take 1 capsule (150 mg total) by mouth 2 (two) times daily. 06/04/17  Yes Tukov-Yual, Alroy Bailiff, NP  furosemide (LASIX) 40 MG tablet Take 1 tablet (40 mg total) by mouth daily. 06/04/17  Yes Tukov-Yual, Alroy Bailiff, NP  losartan (COZAAR) 25 MG tablet Take 0.5 tablets by mouth 2 (two) times daily. 08/23/17  Yes  [provider]  magnesium oxide (MAG-OX) 400 MG tablet Take 1 tablet by mouth 2 (two) times daily. 11/08/17  Yes [provider]  metoprolol succinate (TOPROL-XL) 25 MG 24 hr tablet Take 0.5 tablets (12.5 mg total) by mouth daily. Patient taking differently: Take 12.5 mg by mouth 2 (two) times daily.  06/04/17  Yes Tukov-Yual, Alroy Bailiff, NP  nitroGLYCERIN (NITROSTAT) 0.4 MG SL tablet Place 1 tablet (0.4 mg total) under the tongue every 5 (five)  minutes as needed for chest pain. 06/04/17  Yes Tukov-Yual, Alroy Bailiff, NP    Inpatient Medications:  . amiodarone  400 mg Oral BID  . aspirin EC  81 mg Oral Daily  . atorvastatin  20 mg Oral Daily  . dabigatran  150 mg Oral BID  . docusate sodium  100 mg Oral BID  . furosemide  40 mg Oral Daily  . insulin aspart  0-5 Units Subcutaneous QHS  . insulin aspart  0-9 Units Subcutaneous TID WC  . losartan  12.5 mg Oral BID  . magnesium oxide  400 mg Oral BID  . metoprolol succinate  12.5 mg Oral BID     Allergies:  Allergies  Allergen Reactions  . Flomax [Tamsulosin Hcl]     Social History   Socioeconomic History  . Marital status: Single    Spouse name: Not on file  . Number of children: Not on file  . Years of education: Not on file  . Highest education level: Not on file  Occupational History  . Not on file  Social Needs  . Financial resource strain: Not on file  . Food insecurity:    Worry: Not on file    Inability: Not on file  . Transportation needs:    Medical: Not on file    Non-medical: Not on file  Tobacco Use  . Smoking status: Former Smoker    Packs/day: 2.00    Years: 20.00    Pack years: 40.00    Types: Cigarettes  Substance and Sexual Activity  . Alcohol use: Not on file  . Drug use: Not on file  . Sexual activity: Not on file  Lifestyle  . Physical activity:    Days per week: Not on file    Minutes per session: Not on file  . Stress: Not on file  Relationships  . Social connections:    Talks on phone: Not on file    Gets together: Not on file    Attends religious service: Not on file    Active member of club or organization: Not on file    Attends meetings of clubs or organizations: Not on file    Relationship status: Not on file  . Intimate partner violence:    Fear of current or ex partner: Not on file    Emotionally abused: Not on file    Physically abused: Not on file    Forced sexual activity: Not on file  Other Topics Concern  .  Not on file  Social History Narrative  . Not on file     History reviewed. No pertinent family history. no heart disease  ROS:  Please see the history of present illness.     All other systems reviewed and negative.    Physical Exam: Blood pressure 122/75, pulse (!) 52, temperature 97.9 F (36.6 C), resp. rate 17, height 5\' 11"  (1.803 m), weight 81.5 kg, SpO2 97 %. General: Well developed, well nourished male in no acute distress. Head: Normocephalic, atraumatic,  sclera non-icteric, no xanthomas, nares are without discharge. EENT: normal Lymph Nodes:  none Back: without scoliosis/kyphosis, no CVA tendersness Neck: Negative for carotid bruits. JVD not elevated. Lungs: Clear bilaterally to auscultation without wheezes, rales, or rhonchi. Breathing is unlabored. Heart: RRR with S1 S2. 2/6 systolic murmur , rubs, or gallops appreciated. Abdomen: Soft, non-tender, non-distended with normoactive bowel sounds. No hepatomegaly. No rebound/guarding. No obvious abdominal masses. Msk:  Strength and tone appear normal for age. Extremities: No clubbing or cyanosis. No edema.  Distal pedal pulses are 2+ and equal bilaterally. Skin: Warm and Dry Neuro: Alert and oriented X 3. CN III-XII intact Grossly normal sensory and motor function . Psych:  Responds to questions appropriately with a normal affect.      Labs: Cardiac Enzymes Recent Labs    01/18/18 2130 02-08-18 0325 2018-02-08 0948 08-Feb-2018 1531  TROPONINI <0.03 0.04* 0.04* 0.03*   CBC Lab Results  Component Value Date   WBC 6.8 01/20/2018   HGB 13.2 01/20/2018   HCT 41.5 01/20/2018   MCV 94.5 01/20/2018   PLT 183 01/20/2018   PROTIME: No results for input(s): LABPROT, INR in the last 72 hours. Chemistry  Recent Labs  Lab 01/20/18 0327  NA 140  K 4.0  CL 105  CO2 31  BUN 20  CREATININE 1.08  CALCIUM 9.0  PROT 6.5  BILITOT 0.8  ALKPHOS 77  ALT 33  AST 31  GLUCOSE 92   Lipids Lab Results  Component Value Date    CHOL 216 (H) 06/04/2017   HDL 24 (L) 06/04/2017   LDLCALC 166 (H) 06/04/2017   TRIG 130 06/04/2017   BNP No results found for: PROBNP Thyroid Function Tests: Recent Labs    02/08/2018 0325  TSH 1.850  T4TOTAL 8.0      Miscellaneous No results found for: DDIMER  Radiology/Studies:  US Renal  Result Date: 2018-02-08 CLINICAL DATA:  Acute renal failure. EXAM: RENAL / URINARY TRACT ULTRASOUND COMPLETE COMPARISON:  None. FINDINGS: Right Kidney: Renal measurements: 11.2 x 5.4 x 6.4 cm = volume: 199 mL . Echogenicity within normal limits. No hydronephrosis. 3.4 cm upper pole cyst. 1.5 cm echogenic focus with prominent posterior shadowing in the lateral interpolar region. Left Kidney: Renal measurements: 11.7 x 5.0 x 5.9 cm = volume: 182 mL. Echogenicity within normal limits. No hydronephrosis. 1.9 cm and 1.8 cm lower pole cyst. 5 mm echogenic focus with posterior shadowing in the lower pole. Bladder: Appears normal for degree of bladder distention. IMPRESSION: 1. No hydronephrosis. 2. Bilateral renal cysts. 3. 1.5 cm calcification in the interpolar right kidney. 4. 5 mm left lower pole calculus. Electronically Signed   By: Sebastian Ache M.D.   On: 08-Feb-2018 10:19   Dg Chest Portable 1 View  Result Date: 01/18/2018 CLINICAL DATA:  Dizziness.  Lightheadedness. EXAM: PORTABLE CHEST 1 VIEW COMPARISON:  September 21, 2016 FINDINGS: Calcified pleural plaques. Stable AICD. Stable cardiomegaly. The hila and mediastinum are unremarkable. No overt edema. No focal infiltrate. IMPRESSION: Stable calcified pleural plaques and cardiomegaly. No other acute abnormalities. Electronically Signed   By: Gerome Sam III M.D   On: 01/18/2018 22:01    EKG Wide-complex tachycardia cycle length of 340 ms with left axis deviation.  aVR has a broad Q wave of 80 ms consistent with ventricular tachycardia  : Sinus bradycardia with AV disassociation and ventricular pacing at a rate higher than the sinus  rate   Assessment and Plan:  Ventricular tachycardia-recurrent  Biventricular ICD without an atrial  lead  Sinus bradycardia with A-V dissociation and asynchronous ventricular pacing  Aspirin therapy?  Hypotension precluding up titration of afterload reduction  Dyslipidemia  Cardiomyopathy--interval improvement  High Risk Medication Surveillance    Number of issues warrant addressing.  The first is reprogramming his ICD supposed to be able to detect his slower ventricular tachycardia.  We will be in touch with [employer].  The second is also to consider reprogramming his ventricular pacing rate so as to allow sinus rhythm and AV synchrony.  I have reviewed with him as have others the potential benefit of AV synchrony that would require procedural revision and the placement of an atrial lead.  With recurrent ventricular tachycardia, would be inclined to add another drug.  We will review with Dr. Sandy Salaam at [facility] but would favor ranolazine and/or mexiletine  According to the patient blood pressures and dizziness have not been an issue unless he has had ventricular tachycardia.  Hence, there may be a role for up titration of his ARB.  With his previously identified low ejection fraction I thought about Entresto; however, with his ejection fraction of 50-55% would not be indicated.  Need clarification as to his need for adjunctive aspirin.  Will defer to [facility].  amio labs are within range       Sherryl Manges

## 2018-01-20 NOTE — Progress Notes (Signed)
Called Dr Marikay Alar regarding patients heart rate 49-55. Metoprolol ordered. Awaiting for provider clarification.

## 2018-01-20 NOTE — Progress Notes (Signed)
Spoke with Dr. Sandy Salaam at [facility].  We will plan to 1-decrease VT detection to 150 bpm 2-decrease ventricular pacing rate from 50--40 3-begin mexiletine 200 mg twice daily. 4 anticipate discharge 48 hours

## 2018-01-20 NOTE — Progress Notes (Signed)
PULMONARY / CRITICAL CARE MEDICINE   Name: Darius Miller MRN: [id number] DOB: 06-29-1945    ADMISSION DATE:  01/18/2018 CONSULTATION DATE:  01/19/18  REFERRING MD:  Dr. Caryn Bee  CHIEF COMPLAINT:  Wide Complex Ventricular Tachycardia  BRIEF DISCUSSION: 72 y.o. Male admitted with sustained Wide Complex Ventricular Tachycardia with conversion to paced rhythm after back to back Amiodarone boluses.  Pt has a AICD, of which Pt reports never fired during event. SUBJECTIVE:  Pt reports he is feeling better Denies chest pain, palpitations, shortness of breath, lightheadedness, or dizziness On room air Was seen by cardiology, adjustment of meds were done AICD is currently being interrogated -Patient had some hypoxia at nighttime  VITAL SIGNS: BP 122/75   Pulse (!) 52   Temp 97.9 F (36.6 C)   Resp 17   Ht 5\' 11"  (1.803 m)   Wt 81.5 kg   SpO2 97%   BMI 25.06 kg/m  INTAKE / OUTPUT: I/O last 3 completed shifts: In: 794.1 [P.O.:720; I.V.:74.1] Out: 2025 [Urine:2025]  PHYSICAL EXAMINATION: General:   sitting in bed, on room air, in NAD Neuro:  Awake, A&O x4, follows commands, no focal deficits, clear speech HEENT:  Atraumatic, normocephalic, neck supple, no JVD Cardiovascular:  Paced rhythm, no M/R/G, 2+ pulses throughout Lungs:  Clear bilaterally, no wheezing, even, nonlabored, normal effort Abdomen:  Soft, nontender, nondistended, BS+ x4 Musculoskeletal:  No deformities, normal bulk and tone, No edema Skin:  Warm/dry. No obvious rashes, lesions, or ulcerations  LABS:  BMET Recent Labs  Lab 01/18/18 2130 01/19/18 0325 01/20/18 0327  NA 142 141 140  K 3.8 4.8 4.0  CL 105 105 105  CO2 29 29 31   BUN 28* 25* 20  CREATININE 1.70* 1.36* 1.08  GLUCOSE 140* 136* 92    Electrolytes Recent Labs  Lab 01/18/18 2130 01/19/18 0325 01/19/18 0948 01/20/18 0327  CALCIUM 9.0 9.1  --  9.0  MG  --   --  2.1 2.1    CBC Recent Labs  Lab 01/18/18 2130 01/19/18 0325  01/20/18 0327  WBC 9.2 8.2 6.8  HGB 14.0 12.6* 13.2  HCT 43.9 39.5 41.5  PLT 218 181 183    Coag's No results for input(s): APTT, INR in the last 168 hours.  Sepsis Markers No results for input(s): LATICACIDVEN, PROCALCITON, O2SATVEN in the last 168 hours.  ABG No results for input(s): PHART, PCO2ART, PO2ART in the last 168 hours.  Liver Enzymes Recent Labs  Lab 01/20/18 0327  AST 31  ALT 33  ALKPHOS 77  BILITOT 0.8  ALBUMIN 3.7    Cardiac Enzymes Recent Labs  Lab 01/19/18 0325 01/19/18 0948 01/19/18 1531  TROPONINI 0.04* 0.04* 0.03*    Glucose Recent Labs  Lab 01/19/18 0751 01/19/18 1118 01/19/18 1637 01/19/18 2122 01/20/18 0748 01/20/18 1111  GLUCAP 95 110* 136* 90 86 148*    Imaging No results found.   STUDIES:  Echocardiogram 01/19/18>> EF 50 to 55% Renal US 01/19/18>> left lower lobe calculus no other abnormality  CULTURES:  ANTIBIOTICS:  SIGNIFICANT EVENTS: 01/18/18>> Admission to [facility] Step[facility] 01/20/2018: Doing well possible transfer out of ICU LINES/TUBES:   ASSESSMENT / PLAN:  PULMONARY A: No acute issues P:   Supplemental O2 as needed to maintain O2 sats > 92% Patient had some hypoxia overnight, when I talked to the patient he suggested that he was told that he might have sleep apnea - He will require outpatient evaluation with sleep study - Patient doing okay he will benefit  from oxygen however his room air saturations are 94% will consider oxygen check with ambulation once is okay by cardiology  CARDIOVASCULAR A:  Ventricular Tachycardia Hx: CAD, A-fib, V-tach s/p AICD placement P:  Cardiac monitoring Maintain MAP >65 Cardiology consulted, appreciate input Currently getting AICD interrogated Continue home Amiodarone PO dose Continue home Lasix, Losartan, and Metoprolol-EP physician suggested Sherryll Burger will defer to them ECHO reviewed TSH is okay  RENAL A:   AKI-improvement noted P:   Monitor I&O's / urinary  output Follow BMP Ensure adequate renal perfusion Avoid nephrotoxic agents as able Replace electrolytes as indicated Renal Ultrasound reviewed, just left bowel stone no other abnormality  GASTROINTESTINAL A:   No acute issues P:   Diet as tolerated  HEMATOLOGIC A:   No acute issues P:  Monitor for S/Sx of bleeding Trend CBC Continue home Pradaxa  for VTE Prophylaxis  Transfuse for Hgb <7   INFECTIOUS A:   No acute issues P:   Monitor fever curve Trend WBC's  ENDOCRINE A:   Hyperglycemia P:   CBG's SSI Follow ICU Hypo/Hyperglycemia protocol TSH is  NEUROLOGIC A:   No acute issues P:   Provide supportive care Avoid sedating meds as able Lights on during the day Promote normal sleep/wake cycle   -Overall doing well okay to transfer out of ICU with telemetry monitoring -Will defer further management to cardiology and hospitalist team -We will sign off please call us back if he can be of further help  Harmon Bommarito [facility] Pulmonary Critical Care & Sleep Medicine

## 2018-01-20 NOTE — Care Management Note (Signed)
Case Management Note  Patient Details  Name: Darius Miller MRN: [id number] Date of Birth: 1945/06/16  Subjective/Objective:      Patient admitted with sustained Wide Complex Ventricular Tachycardia.  Pt is followed by cardiology with [facility].  PCP is Dr. Letitia Libra who he follows up with every 6 months.  Patient is from home and lives with his wife.  He reports that he is independent and in the middle of remodeling his bathroom.  He reports that he does have a walker but does not need to use it.  He is able to drive and has reliable transportation.  PT came and evaluated and recommends outpatient PT.  Patient at this time reports that he is too busy to go to outpatient PT, so he does not wish for [facility] to arrange outpatient PT.  Patient reports that he is very active at home.   [name] RN BSN [phone]                Action/Plan:   Expected Discharge Date:                  Expected Discharge Plan:  Home/Self Care  In-House Referral:     Discharge planning Services  CM Consult  Post Acute Care Choice:    Choice offered to:     DME Arranged:    DME Agency:     HH Arranged:    HH Agency:     Status of Service:  In process, will continue to follow  If discussed at Long Length of Stay Meetings, dates discussed:    Additional Comments:  [name], RN 01/20/2018, 3:33 PM

## 2018-01-20 NOTE — Progress Notes (Signed)
Report given to [place]. Patient alert and oriented. On room air no complaints of chest pain or shortness of breath. Followed by cardiology. Heart healthy diet. Using urinal.

## 2018-01-21 LAB — GLUCOSE, CAPILLARY
GLUCOSE-CAPILLARY: 90 mg/dL (ref 70–99)
GLUCOSE-CAPILLARY: 93 mg/dL (ref 70–99)
Glucose-Capillary: 103 mg/dL — ABNORMAL HIGH (ref 70–99)
Glucose-Capillary: 81 mg/dL (ref 70–99)

## 2018-01-21 NOTE — Plan of Care (Signed)

## 2018-01-21 NOTE — Progress Notes (Signed)
OT Cancellation Note  Patient Details Name: Budd Lucke MRN: [id number] DOB: October 03, 1945   Cancelled Treatment:    Reason Eval/Treat Not Completed: Patient declined, no reason specified Pt. Education was provided about OT services. Pt. reports that he is familiar with the services, and has declined to have OT intervention at this time.   Olegario Messier, MS, OTR/L 01/21/2018, 11:24 AM

## 2018-01-21 NOTE — Care Management Important Message (Signed)
Copy of signed IM left with patient in room.  

## 2018-01-21 NOTE — Progress Notes (Signed)
[employer] - [place] at [facility]   PATIENT NAME: Darius Miller    MR#:  [id number]  DATE OF BIRTH:  19-Mar-1945  SUBJECTIVE:  CHIEF COMPLAINT:   Chief Complaint  Patient presents with  . Irregular Heart Beat   Came with V tech. After amiodarone injections, now in NSR, no complains.  Seen by EP physician, AICD setting changed.  REVIEW OF SYSTEMS:  CONSTITUTIONAL: No fever, fatigue or weakness.  EYES: No blurred or double vision.  EARS, NOSE, AND THROAT: No tinnitus or ear pain.  RESPIRATORY: No cough, shortness of breath, wheezing or hemoptysis.  CARDIOVASCULAR: No chest pain, orthopnea, edema.  GASTROINTESTINAL: No nausea, vomiting, diarrhea or abdominal pain.  GENITOURINARY: No dysuria, hematuria.  ENDOCRINE: No polyuria, nocturia,  HEMATOLOGY: No anemia, easy bruising or bleeding SKIN: No rash or lesion. MUSCULOSKELETAL: No joint pain or arthritis.   NEUROLOGIC: No tingling, numbness, weakness.  PSYCHIATRY: No anxiety or depression.   ROS  DRUG ALLERGIES:   Allergies  Allergen Reactions  . Flomax [Tamsulosin Hcl]     VITALS:  Blood pressure 117/71, pulse (!) 58, temperature 98.3 F (36.8 C), temperature source Oral, resp. rate 18, height 5\' 11"  (1.803 m), weight 81.3 kg, SpO2 97 %.  PHYSICAL EXAMINATION:  GENERAL:  72 y.o.-year-old patient lying in the bed with no acute distress.  EYES: Pupils equal, round, reactive to light and accommodation. No scleral icterus. Extraocular muscles intact.  HEENT: Head atraumatic, normocephalic. Oropharynx and nasopharynx clear.  NECK:  Supple, no jugular venous distention. No thyroid enlargement, no tenderness.  LUNGS: Normal breath sounds bilaterally, no wheezing, rales,rhonchi or crepitation. No use of accessory muscles of respiration.  CARDIOVASCULAR: S1, S2 normal. No murmurs, rubs, or gallops. AICD in place. ABDOMEN: Soft, nontender, nondistended. Bowel sounds present. No organomegaly or mass.   EXTREMITIES: No pedal edema, cyanosis, or clubbing.  NEUROLOGIC: Cranial nerves II through XII are intact. Muscle strength 5/5 in all extremities. Sensation intact. Gait not checked.  PSYCHIATRIC: The patient is alert and oriented x 3.  SKIN: No obvious rash, lesion, or ulcer.   Physical Exam LABORATORY PANEL:   CBC Recent Labs  Lab 01/20/18 0327  WBC 6.8  HGB 13.2  HCT 41.5  PLT 183   ------------------------------------------------------------------------------------------------------------------  Chemistries  Recent Labs  Lab 01/20/18 0327  NA 140  K 4.0  CL 105  CO2 31  GLUCOSE 92  BUN 20  CREATININE 1.08  CALCIUM 9.0  MG 2.1  AST 31  ALT 33  ALKPHOS 77  BILITOT 0.8   ------------------------------------------------------------------------------------------------------------------  Cardiac Enzymes Recent Labs  Lab 01/19/18 0948 01/19/18 1531  TROPONINI 0.04* 0.03*   ------------------------------------------------------------------------------------------------------------------  RADIOLOGY:  No results found.  ASSESSMENT AND PLAN:   Active Problems:   V-tach (HCC)   Chronic systolic heart failure (HCC)  1.  Sustained V. Tach, with conversion to paced rhythm, status post amiodarone IV.  Continue to monitor on telemetry.    Cardiology is consulted for further evaluation and treatment.   Mg and TSH is checked- normal.  Seen by EP physician- Setting on AICD changed.   Started on Mexiletine- suggested to monitor as inpatient for 48 hrs. 2.  Syncope, likely secondary to ventricular tachycardia.  To to monitor clinically closely while treating the underlying disorder. 3.  Acute renal failure, possibly related to low blood pressure episode from earlier today.  Continue to monitor kidney function closely and avoid nephrotoxic medications. 4.  Hypertension, stable, resume home medications. 5.  Paroxysmal atrial fibrillation,  currently paced rhythm.   Continue amiodarone and beta-blocker.  Continue chronic anticoagulation with Pradaxa.   All the records are reviewed and case discussed with Care Management/Social Workerr. Management plans discussed with the patient, family and they are in agreement.  CODE STATUS: Full.  TOTAL TIME TAKING CARE OF THIS PATIENT: 35 minutes.     POSSIBLE D/C IN 1-2 DAYS, DEPENDING ON CLINICAL CONDITION.   Altamese Dilling M.D on 01/21/2018   Between 7am to 6pm - Pager - [phone]  After 6pm go to [email or web address] - password [email or web address]  [facility] [place] [facility]  Office  [phone]  CC: Primary care physician; Gracelyn Nurse, MD  Note: This dictation was prepared with Dragon dictation along with smaller phrase technology. Any transcriptional errors that result from this process are unintentional.

## 2018-01-21 NOTE — Plan of Care (Signed)

## 2018-01-21 NOTE — Progress Notes (Signed)
[employer] - [place] at [facility]   PATIENT NAME: Darius Miller    MR#:  [id number]  DATE OF BIRTH:  April 04, 1945  SUBJECTIVE:  CHIEF COMPLAINT:   Chief Complaint  Patient presents with  . Irregular Heart Beat   Came with V tech. After amiodarone injections, now in NSR, no complains.  Seen by EP physician, AICD setting changed.  REVIEW OF SYSTEMS:  CONSTITUTIONAL: No fever, fatigue or weakness.  EYES: No blurred or double vision.  EARS, NOSE, AND THROAT: No tinnitus or ear pain.  RESPIRATORY: No cough, shortness of breath, wheezing or hemoptysis.  CARDIOVASCULAR: No chest pain, orthopnea, edema.  GASTROINTESTINAL: No nausea, vomiting, diarrhea or abdominal pain.  GENITOURINARY: No dysuria, hematuria.  ENDOCRINE: No polyuria, nocturia,  HEMATOLOGY: No anemia, easy bruising or bleeding SKIN: No rash or lesion. MUSCULOSKELETAL: No joint pain or arthritis.   NEUROLOGIC: No tingling, numbness, weakness.  PSYCHIATRY: No anxiety or depression.   ROS  DRUG ALLERGIES:   Allergies  Allergen Reactions  . Flomax [Tamsulosin Hcl]     VITALS:  Blood pressure 117/71, pulse (!) 58, temperature 98.3 F (36.8 C), temperature source Oral, resp. rate 18, height 5\' 11"  (1.803 m), weight 81.3 kg, SpO2 97 %.  PHYSICAL EXAMINATION:  GENERAL:  72 y.o.-year-old patient lying in the bed with no acute distress.  EYES: Pupils equal, round, reactive to light and accommodation. No scleral icterus. Extraocular muscles intact.  HEENT: Head atraumatic, normocephalic. Oropharynx and nasopharynx clear.  NECK:  Supple, no jugular venous distention. No thyroid enlargement, no tenderness.  LUNGS: Normal breath sounds bilaterally, no wheezing, rales,rhonchi or crepitation. No use of accessory muscles of respiration.  CARDIOVASCULAR: S1, S2 normal. No murmurs, rubs, or gallops. AICD in place. ABDOMEN: Soft, nontender, nondistended. Bowel sounds present. No organomegaly or mass.   EXTREMITIES: No pedal edema, cyanosis, or clubbing.  NEUROLOGIC: Cranial nerves II through XII are intact. Muscle strength 5/5 in all extremities. Sensation intact. Gait not checked.  PSYCHIATRIC: The patient is alert and oriented x 3.  SKIN: No obvious rash, lesion, or ulcer.   Physical Exam LABORATORY PANEL:   CBC Recent Labs  Lab 01/20/18 0327  WBC 6.8  HGB 13.2  HCT 41.5  PLT 183   ------------------------------------------------------------------------------------------------------------------  Chemistries  Recent Labs  Lab 01/20/18 0327  NA 140  K 4.0  CL 105  CO2 31  GLUCOSE 92  BUN 20  CREATININE 1.08  CALCIUM 9.0  MG 2.1  AST 31  ALT 33  ALKPHOS 77  BILITOT 0.8   ------------------------------------------------------------------------------------------------------------------  Cardiac Enzymes Recent Labs  Lab 01/19/18 0948 01/19/18 1531  TROPONINI 0.04* 0.03*   ------------------------------------------------------------------------------------------------------------------  RADIOLOGY:  No results found.  ASSESSMENT AND PLAN:   Active Problems:   V-tach (HCC)   Chronic systolic heart failure (HCC)  1.  Sustained V. Tach, with conversion to paced rhythm, status post amiodarone IV.  Continue to monitor on telemetry.    Cardiology is consulted for further evaluation and treatment.   Mg and TSH is checked- normal.  Seen by EP physician- Setting on AICD changed.   Started on Mexiletine- suggested to monitor as inpatient for 48 hrs.  stable so far. 2.  Syncope, likely secondary to ventricular tachycardia.  To to monitor clinically closely while treating the underlying disorder. 3.  Acute renal failure, possibly related to low blood pressure episode from earlier today.  Continue to monitor kidney function closely and avoid nephrotoxic medications. 4.  Hypertension, stable, resume home medications. 5.  Paroxysmal atrial fibrillation, currently  paced rhythm.  Continue amiodarone and beta-blocker.  Continue chronic anticoagulation with Pradaxa.   All the records are reviewed and case discussed with Care Management/Social Workerr. Management plans discussed with the patient, family and they are in agreement.  CODE STATUS: Full.  TOTAL TIME TAKING CARE OF THIS PATIENT: 35 minutes.     POSSIBLE D/C IN 1-2 DAYS, DEPENDING ON CLINICAL CONDITION.   Altamese Dilling M.D on 01/21/2018   Between 7am to 6pm - Pager - [phone]  After 6pm go to [email or web address] - password [email or web address]  [facility] [place] [facility]  Office  [phone]  CC: Primary care physician; Gracelyn Nurse, MD  Note: This dictation was prepared with Dragon dictation along with smaller phrase technology. Any transcriptional errors that result from this process are unintentional.

## 2018-01-21 NOTE — Progress Notes (Signed)
Progress Note  Patient Name: Darius Miller Date of Encounter: 01/21/2018  Primary Cardiologist: Kateri Mc  Subjective   No recurrent VT.  No c/p or dyspnea. Overall feels well.  Inpatient Medications    Scheduled Meds: . amiodarone  400 mg Oral BID  . aspirin EC  81 mg Oral Daily  . atorvastatin  20 mg Oral Daily  . dabigatran  150 mg Oral BID  . docusate sodium  100 mg Oral BID  . furosemide  40 mg Oral Daily  . insulin aspart  0-5 Units Subcutaneous QHS  . insulin aspart  0-9 Units Subcutaneous TID WC  . losartan  12.5 mg Oral BID  . magnesium oxide  400 mg Oral BID  . metoprolol succinate  12.5 mg Oral BID  . mexiletine  200 mg Oral Q12H   Continuous Infusions:  PRN Meds: acetaminophen **OR** acetaminophen, bisacodyl, HYDROcodone-acetaminophen, nitroGLYCERIN, ondansetron **OR** ondansetron (ZOFRAN) IV, traZODone   Vital Signs    Vitals:   01/21/18 0140 01/21/18 0551 01/21/18 0743 01/21/18 1000  BP:  106/77 124/71 95/61  Pulse: (!) 59 (!) 58 (!) 59   Resp:   18   Temp:  98.5 F (36.9 C) 98.2 F (36.8 C)   TempSrc:  Oral Oral   SpO2: 97% 97% 93%   Weight:  81.3 kg    Height:        Intake/Output Summary (Last 24 hours) at 01/21/2018 1107 Last data filed at 01/21/2018 0959 Gross per 24 hour  Intake 720 ml  Output 1100 ml  Net -380 ml   Filed Weights   01/20/18 0300 01/20/18 1753 01/21/18 0551  Weight: 81.5 kg 81 kg 81.3 kg    Physical Exam   GEN: Well nourished, well developed, in no acute distress.  HEENT: Grossly normal.  Neck: Supple, no JVD, carotid bruits, or masses. Cardiac: RRR, no murmurs, rubs, or gallops. No clubbing, cyanosis, edema.  Radials/DP/PT 2+ and equal bilaterally.  Respiratory:  Respirations regular and unlabored, clear to auscultation bilaterally. GI: Soft, nontender, nondistended, BS + x 4. MS: no deformity or atrophy. Skin: warm and dry, no rash. Neuro:  Strength and sensation are intact. Psych: AAOx3.  Normal  affect.  Labs    Chemistry Recent Labs  Lab 01/18/18 2130 01/19/18 0325 01/20/18 0327  NA 142 141 140  K 3.8 4.8 4.0  CL 105 105 105  CO2 29 29 31   GLUCOSE 140* 136* 92  BUN 28* 25* 20  CREATININE 1.70* 1.36* 1.08  CALCIUM 9.0 9.1 9.0  PROT  --   --  6.5  ALBUMIN  --   --  3.7  AST  --   --  31  ALT  --   --  33  ALKPHOS  --   --  77  BILITOT  --   --  0.8  GFRNONAA 39* 51* >60  GFRAA 45* 59* >60  ANIONGAP 8 7 4*     Hematology Recent Labs  Lab 01/18/18 2130 01/19/18 0325 01/20/18 0327  WBC 9.2 8.2 6.8  RBC 4.55 4.12* 4.39  HGB 14.0 12.6* 13.2  HCT 43.9 39.5 41.5  MCV 96.5 95.9 94.5  MCH 30.8 30.6 30.1  MCHC 31.9 31.9 31.8  RDW 13.8 13.9 13.7  PLT 218 181 183    Cardiac Enzymes Recent Labs  Lab 01/18/18 2130 01/19/18 0325 01/19/18 0948 01/19/18 1531  TROPONINI <0.03 0.04* 0.04* 0.03*      Radiology    No results found.  Telemetry    V paced - Personally Reviewed  Cardiac Studies   2D Echocardiogram 11.13.2019  Study Conclusions   - Left ventricle: Systolic function was normal. The estimated   ejection fraction was in the range of 50% to 55%. - Mitral valve: There was mild regurgitation. - Left atrium: The atrium was moderately to severely dilated. - Right ventricle: The cavity size was moderately dilated. Systolic   pressure was increased. - Right atrium: The atrium was mildly dilated. - Pulmonary arteries: PA peak pressure: 37 mm Hg (S).   Patient Profile      72 y.o. male with a hx of paroxysmal Afib on chronic anticoagulation with pradaxa, wide complex tachycardia and tachycardia s/p AICD, 07/2017 VT ablation, HLD on statin therapy, CAD s/p PCI with DES to RCA 2009 and repeat cath 2019 as below, 10/2017 mitral valve insufficiency s/p repair, HFrEF (EF 40%), h/o CVA, GERD, COPD, and nephrolithiasis, who was admitted 11/12 in the setting of VT.  Assessment & Plan    1.  Ventricular tachycardia:  Seen by Dr. Graciela Husbands on 11/14.   Adjustments made to ICD setting and placed on mexiletine.  No recurrent WCT/VT.  If stable on tele tonight, would plan d/c in AM and f/u with his EP @ [facility].  Cont amio,  blocker.  2.  CAD/Elevated trop: no chest pain. Trop minimally elevated in the setting of above - likely demand ischemia.  Echo w/ EF 50-55%.  No plan for ischemic eval @ this time.  Cont asa, statin,  blocker.  3.  PAF:  In sinus w/ wide 1st deg AVB and V pacing.  Chronic pradaxa rx.  4.  HFrEF: EF 50-55% by echo this admission. Euvolemic.  Cont  blocker/arb.  BP softer this AM but improved after awakening.  5.  HL:  Cont statin.  Signed, Nicolasa Ducking, NP  01/21/2018, 11:07 AM    For questions or updates, please contact   Please consult [email or web address] for contact info under Cardiology/STEMI.

## 2018-01-22 DIAGNOSIS — I25118 Atherosclerotic heart disease of native coronary artery with other forms of angina pectoris: Secondary | ICD-10-CM

## 2018-01-22 LAB — GLUCOSE, CAPILLARY
GLUCOSE-CAPILLARY: 189 mg/dL — AB (ref 70–99)
GLUCOSE-CAPILLARY: 86 mg/dL (ref 70–99)

## 2018-01-22 MED ORDER — LOSARTAN POTASSIUM 25 MG PO TABS: 12.5000 mg | ORAL_TABLET | Freq: Every day | ORAL | 0 refills | Status: DC

## 2018-01-22 MED ORDER — METOPROLOL SUCCINATE ER 25 MG PO TB24: 12.5000 mg | ORAL_TABLET | Freq: Two times a day (BID) | ORAL | Status: DC

## 2018-01-22 MED ORDER — METOPROLOL SUCCINATE ER 25 MG PO TB24
12.5000 mg | ORAL_TABLET | Freq: Every day | ORAL | Status: DC
  Administered 2018-01-22: 12.5 mg via ORAL

## 2018-01-22 MED ORDER — LOSARTAN POTASSIUM 25 MG PO TABS
12.5000 mg | ORAL_TABLET | Freq: Every day | ORAL | Status: DC
  Administered 2018-01-22: 12.5 mg via ORAL

## 2018-01-22 MED ORDER — MEXILETINE HCL 200 MG PO CAPS: 200.0000 mg | ORAL_CAPSULE | Freq: Two times a day (BID) | ORAL | 0 refills | Status: DC

## 2018-01-22 MED ORDER — AMIODARONE HCL 400 MG PO TABS: 400.0000 mg | ORAL_TABLET | Freq: Two times a day (BID) | ORAL | 0 refills | Status: DC

## 2018-01-22 MED ORDER — METOPROLOL SUCCINATE ER 25 MG PO TB24: 12.5000 mg | ORAL_TABLET | Freq: Two times a day (BID) | ORAL | 0 refills | Status: DC

## 2018-01-22 NOTE — Progress Notes (Signed)
Progress Note  Patient Name: Darius Miller Date of Encounter: 01/22/2018  Primary Cardiologist: Kateri Mc  Subjective   No acute overnight events.  No further ectopy.  No chest pain or dyspnea.  Tolerating medications without issues.  Has not ambulated yet this admission.  Feels well.  Inpatient Medications    Scheduled Meds: . amiodarone  400 mg Oral BID  . aspirin EC  81 mg Oral Daily  . atorvastatin  20 mg Oral Daily  . dabigatran  150 mg Oral BID  . docusate sodium  100 mg Oral BID  . furosemide  40 mg Oral Daily  . insulin aspart  0-5 Units Subcutaneous QHS  . insulin aspart  0-9 Units Subcutaneous TID WC  . losartan  12.5 mg Oral Daily  . magnesium oxide  400 mg Oral BID  . metoprolol succinate  12.5 mg Oral Daily  . mexiletine  200 mg Oral Q12H   Continuous Infusions:  PRN Meds: acetaminophen **OR** acetaminophen, bisacodyl, HYDROcodone-acetaminophen, nitroGLYCERIN, ondansetron **OR** ondansetron (ZOFRAN) IV, traZODone   Vital Signs    Vitals:   01/21/18 2113 01/22/18 0440 01/22/18 0736 01/22/18 1046  BP: 117/71 112/60 106/69 99/63  Pulse: (!) 58 62 (!) 59 60  Resp: 18  16   Temp: 98.3 F (36.8 C) 98.2 F (36.8 C) 98.1 F (36.7 C)   TempSrc: Oral Oral Oral   SpO2: 97% 95% 95% 96%  Weight:  80.2 kg    Height:        Intake/Output Summary (Last 24 hours) at 01/22/2018 1141 Last data filed at 01/22/2018 0558 Gross per 24 hour  Intake 480 ml  Output 400 ml  Net 80 ml   Filed Weights   01/20/18 1753 01/21/18 0551 01/22/18 0440  Weight: 81 kg 81.3 kg 80.2 kg    Telemetry    V-paced - Personally Reviewed  ECG    n/a - Personally Reviewed  Physical Exam   GEN: No acute distress.   Neck: No JVD. Cardiac: RRR, no murmurs, rubs, or gallops.  Pads in place. Respiratory: Clear to auscultation bilaterally.  GI: Soft, nontender, non-distended.   MS: No edema; No deformity. Neuro:  Alert and oriented x 3; Nonfocal.  Psych: Normal affect.  Labs     Chemistry Recent Labs  Lab 01/18/18 2130 01/19/18 0325 01/20/18 0327  NA 142 141 140  K 3.8 4.8 4.0  CL 105 105 105  CO2 29 29 31   GLUCOSE 140* 136* 92  BUN 28* 25* 20  CREATININE 1.70* 1.36* 1.08  CALCIUM 9.0 9.1 9.0  PROT  --   --  6.5  ALBUMIN  --   --  3.7  AST  --   --  31  ALT  --   --  33  ALKPHOS  --   --  77  BILITOT  --   --  0.8  GFRNONAA 39* 51* >60  GFRAA 45* 59* >60  ANIONGAP 8 7 4*     Hematology Recent Labs  Lab 01/18/18 2130 01/19/18 0325 01/20/18 0327  WBC 9.2 8.2 6.8  RBC 4.55 4.12* 4.39  HGB 14.0 12.6* 13.2  HCT 43.9 39.5 41.5  MCV 96.5 95.9 94.5  MCH 30.8 30.6 30.1  MCHC 31.9 31.9 31.8  RDW 13.8 13.9 13.7  PLT 218 181 183    Cardiac Enzymes Recent Labs  Lab 01/18/18 2130 01/19/18 0325 01/19/18 0948 01/19/18 1531  TROPONINI <0.03 0.04* 0.04* 0.03*   No results for input(s):  TROPIPOC in the last 168 hours.   BNPNo results for input(s): BNP, PROBNP in the last 168 hours.   DDimer No results for input(s): DDIMER in the last 168 hours.   Radiology    No results found.  Cardiac Studies   2D Echocardiogram 11.13.2019  Study Conclusions  - Left ventricle: Systolic function was normal. The estimated ejection fraction was in the range of 50% to 55%. - Mitral valve: There was mild regurgitation. - Left atrium: The atrium was moderately to severely dilated. - Right ventricle: The cavity size was moderately dilated. Systolic pressure was increased. - Right atrium: The atrium was mildly dilated. - Pulmonary arteries: PA peak pressure: 37 mm Hg (S).  Patient Profile     72 y.o. male with history of paroxysmalAfibon chronic anticoagulation with pradaxa, wide complex tachycardiaand tachycardias/p AICD,07/2017 VT ablation, HLD on statin therapy,CADs/p PCI with DES to RCA 2009 and repeat cath 2019 as per consult note,10/2017 mitral valve insufficiency s/p repair, HFrEF (EF 40%),h/o CVA, GERD, COPD, and nephrolithiasis,  who was admitted 11/12 in the setting of VT.  Assessment & Plan    1.  Ventricular tachycardia: -Seen by Dr. Graciela Husbands with EP on 11/14 with adjustments being made to ICD setting and placed on mexiletine per EP recommendations -No recurrent wide-complex tachycardia/VT -Remains on amiodarone, mexiletine, and metoprolol -We will need close follow-up with [facility] EP -Last check lytes were at goal  2.  CAD/elevated troponin: -Troponin minimally elevated likely in the setting of the above felt to be supply demand ischemia -Echo with EF 50 to 55% -No plans for ischemic evaluation at this time -Continue aspirin, Lipitor, and metoprolol  3.  PAF: -Remains in sinus with V pacing -Continue Pradaxa   4.  HFrEF: -He appears euvolemic -Continue metoprolol, losartan, and Lasix  5.  Hyperlipidemia: -Continue statin   For questions or updates, please contact [facility] Please consult [email or web address] for contact info under Cardiology/STEMI.    Signed, Eula Listen, PA-C [facility] Pager: [phone] 01/22/2018, 11:41 AM

## 2018-01-22 NOTE — Progress Notes (Signed)
Pt discharged home via wheelchair with wife

## 2018-01-22 NOTE — Plan of Care (Signed)

## 2018-01-22 NOTE — Discharge Summary (Addendum)
[facility] [place] [facility]   PATIENT NAME: Darius Miller    MR#:  [id number]  DATE OF BIRTH:  1945-04-13  DATE OF ADMISSION:  01/18/2018 ADMITTING PHYSICIAN: Cammy Copa, MD  DATE OF DISCHARGE: 01/22/2018  PRIMARY CARE PHYSICIAN: Gracelyn Nurse, MD    ADMISSION DIAGNOSIS:  Dizziness  DISCHARGE DIAGNOSIS:  Active Problems:   V-tach [facility])     SECONDARY DIAGNOSIS:   Past Medical History:  Diagnosis Date  . Atrial fibrillation (HCC)   . Coronary artery disease  s/p RCA stents   . History of mitral valve repair   . 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 Kidney stones   . Stroke (HCC)   . Ventricular tachycardia [facility])     HOSPITAL COURSE:  72 year old male with history of PAF on chronic anticoagulation, wide-complex tachycardia status post AICD and CAD who presented to the ER in the setting of V. Tach.  1.  V. tach: Patient was followed by cardiology.  Adjustments made to ICD setting and patient is now on the Loxitane.  Patient has had no recurrent wide-complex tachycardia/V. tach.  Patient will follow-up with his cardiologist at [facility] within the next week.  He will continue on amiodarone and beta-blocker.  2.  CAD with slightly elevated troponin due to V. tach: Patient was ruled out for ACS. He will continue aspirin, statin and beta-blocker   3.  PAF: Patient is on chronic anticoagulation and will continue with this.  He will continue beta-blocker for heart rate control as well as amiodarone.  4.  Hyperlipidemia: Continue statin  DISCHARGE CONDITIONS AND DIET:   Patient is stable for discharge on cardiac diet  CONSULTS OBTAINED:  Treatment Team:  Yvonne Kendall, MD  DRUG ALLERGIES:   Allergies  Allergen Reactions  . Flomax [Tamsulosin Hcl]     DISCHARGE MEDICATIONS:   Allergies as of 01/22/2018      Reactions   Flomax [tamsulosin Hcl]       Medication List    TAKE these  medications   acetaminophen 500 MG tablet Commonly known as:  TYLENOL Take 1 tablet by mouth every 8 (eight) hours as needed.   amiodarone 400 MG tablet Commonly known as:  PACERONE Take 1 tablet (400 mg total) by mouth 2 (two) times daily. What changed:    medication strength  when to take this   aspirin 81 MG tablet Take 1 tablet (81 mg total) by mouth daily.   atorvastatin 20 MG tablet Commonly known as:  LIPITOR Take 20 mg by mouth daily.   dabigatran 150 MG Caps capsule Commonly known as:  PRADAXA Take 1 capsule (150 mg total) by mouth 2 (two) times daily.   furosemide 40 MG tablet Commonly known as:  LASIX Take 1 tablet (40 mg total) by mouth daily.   losartan 25 MG tablet Commonly known as:  COZAAR Take 0.5 tablets (12.5 mg total) by mouth daily. What changed:  when to take this   magnesium oxide 400 MG tablet Commonly known as:  MAG-OX Take 1 tablet by mouth 2 (two) times daily.   metoprolol succinate 25 MG 24 hr tablet Commonly known as:  TOPROL-XL Take 0.5 tablets (12.5 mg total) by mouth 2 (two) times daily.   mexiletine 200 MG capsule Commonly known as:  MEXITIL Take 1 capsule (200 mg total) by mouth every 12 (twelve) hours.   nitroGLYCERIN 0.4 MG SL tablet Commonly known as:  NITROSTAT Place 1 tablet (  0.4 mg total) under the tongue every 5 (five) minutes as needed for chest pain.         Today   CHIEF COMPLAINT:  No acute issues overnight no V. tach reported   VITAL SIGNS:  Blood pressure 99/63, pulse 60, temperature 98.1 F (36.7 C), temperature source Oral, resp. rate 16, height 5\' 11"  (1.803 m), weight 80.2 kg, SpO2 96 %.   REVIEW OF SYSTEMS:  Review of Systems  Constitutional: Negative.  Negative for chills, fever and malaise/fatigue.  HENT: Negative.  Negative for ear discharge, ear pain, hearing loss, nosebleeds and sore throat.   Eyes: Negative.  Negative for blurred vision and pain.  Respiratory: Negative.  Negative for  cough, hemoptysis, shortness of breath and wheezing.   Cardiovascular: Negative.  Negative for chest pain, palpitations and leg swelling.  Gastrointestinal: Negative.  Negative for abdominal pain, blood in stool, diarrhea, nausea and vomiting.  Genitourinary: Negative.  Negative for dysuria.  Musculoskeletal: Negative.  Negative for back pain.  Skin: Negative.   Neurological: Negative for dizziness, tremors, speech change, focal weakness, seizures and headaches.  Endo/Heme/Allergies: Negative.  Does not bruise/bleed easily.  Psychiatric/Behavioral: Negative.  Negative for depression, hallucinations and suicidal ideas.     PHYSICAL EXAMINATION:  GENERAL:  72 y.o.-year-old patient lying in the bed with no acute distress.  NECK:  Supple, no jugular venous distention. No thyroid enlargement, no tenderness.  LUNGS: Normal breath sounds bilaterally, no wheezing, rales,rhonchi  No use of accessory muscles of respiration.  CARDIOVASCULAR: S1, S2 normal. No murmurs, rubs, or gallops.  ABDOMEN: Soft, non-tender, non-distended. Bowel sounds present. No organomegaly or mass.  EXTREMITIES: No pedal edema, cyanosis, or clubbing.  PSYCHIATRIC: The patient is alert and oriented x 3.  SKIN: No obvious rash, lesion, or ulcer.   DATA REVIEW:   CBC Recent Labs  Lab 01/20/18 0327  WBC 6.8  HGB 13.2  HCT 41.5  PLT 183    Chemistries  Recent Labs  Lab 01/20/18 0327  NA 140  K 4.0  CL 105  CO2 31  GLUCOSE 92  BUN 20  CREATININE 1.08  CALCIUM 9.0  MG 2.1  AST 31  ALT 33  ALKPHOS 77  BILITOT 0.8    Cardiac Enzymes Recent Labs  Lab 01/19/18 0325 01/19/18 0948 01/19/18 1531  TROPONINI 0.04* 0.04* 0.03*    Microbiology Results  @MICRORSLT48 @  RADIOLOGY:  No results found.    Allergies as of 01/22/2018      Reactions   Flomax [tamsulosin Hcl]       Medication List    TAKE these medications   acetaminophen 500 MG tablet Commonly known as:  TYLENOL Take 1 tablet by  mouth every 8 (eight) hours as needed.   amiodarone 400 MG tablet Commonly known as:  PACERONE Take 1 tablet (400 mg total) by mouth 2 (two) times daily. What changed:    medication strength  when to take this   aspirin 81 MG tablet Take 1 tablet (81 mg total) by mouth daily.   atorvastatin 20 MG tablet Commonly known as:  LIPITOR Take 20 mg by mouth daily.   dabigatran 150 MG Caps capsule Commonly known as:  PRADAXA Take 1 capsule (150 mg total) by mouth 2 (two) times daily.   furosemide 40 MG tablet Commonly known as:  LASIX Take 1 tablet (40 mg total) by mouth daily.   losartan 25 MG tablet Commonly known as:  COZAAR Take 0.5 tablets (12.5 mg total) by mouth daily.  What changed:  when to take this   magnesium oxide 400 MG tablet Commonly known as:  MAG-OX Take 1 tablet by mouth 2 (two) times daily.   metoprolol succinate 25 MG 24 hr tablet Commonly known as:  TOPROL-XL Take 0.5 tablets (12.5 mg total) by mouth 2 (two) times daily.   mexiletine 200 MG capsule Commonly known as:  MEXITIL Take 1 capsule (200 mg total) by mouth every 12 (twelve) hours.   nitroGLYCERIN 0.4 MG SL tablet Commonly known as:  NITROSTAT Place 1 tablet (0.4 mg total) under the tongue every 5 (five) minutes as needed for chest pain.          Management plans discussed with the patient and he is in agreement. Stable for discharge home  Patient should follow up with cardiology  CODE STATUS:     Code Status Orders  (From admission, onward)         Start     Ordered   01/19/18 0007  Full code  Continuous     01/19/18 0006        Code Status History    Date Active Date Inactive Code Status Order ID Comments User Context   06/03/2017 1826 06/05/2017 0123 Full Code [id number]  Salary, Evelena Asa, MD Inpatient      TOTAL TIME TAKING CARE OF THIS PATIENT: 38 minutes.    Note: This dictation was prepared with Dragon dictation along with smaller phrase technology. Any  transcriptional errors that result from this process are unintentional.  Irelynn Schermerhorn M.D on 01/22/2018 at 11:57 AM  Between 7am to 6pm - Pager - [phone] After 6pm go to [email or web address] - password [email or web address]  [facility]  Office  [phone]  CC: Primary care physician; Gracelyn Nurse, MD

## 2018-01-22 NOTE — Plan of Care (Signed)
  Problem: Education: Goal: Knowledge of General Education information will improve Description Including pain rating scale, medication(s)/side effects and non-pharmacologic comfort measures 01/22/2018 0011 by Myles Gip, RN Outcome: Progressing 01/22/2018 0011 by Myles Gip, RN Outcome: Progressing   Problem: Health Behavior/Discharge Planning: Goal: Ability to manage health-related needs will improve 01/22/2018 0011 by Myles Gip, RN Outcome: Progressing 01/22/2018 0011 by Myles Gip, RN Outcome: Progressing   Problem: Clinical Measurements: Goal: Ability to maintain clinical measurements within normal limits will improve Outcome: Progressing

## 2018-01-22 NOTE — Progress Notes (Signed)
Discharge paperwork and prescriptions reviewed with patient. PIVs discontinued. No questions or concerns at this time.

## 2018-08-16 ENCOUNTER — Ambulatory Visit
Admission: RE | Admit: 2018-08-16 | Discharge: 2018-08-16 | Disposition: A | Discharge status: Home / Self Care | Payer: Medicare HMO | Source: Ambulatory Visit | Attending: Physician Assistant | Admitting: Physician Assistant

## 2018-08-16 ENCOUNTER — Other Ambulatory Visit: Payer: Self-pay

## 2018-08-16 ENCOUNTER — Other Ambulatory Visit: Payer: Self-pay | Admitting: Physician Assistant

## 2018-08-16 DIAGNOSIS — R140 Abdominal distension (gaseous): Secondary | ICD-10-CM

## 2018-08-16 DIAGNOSIS — R3121 Asymptomatic microscopic hematuria: Secondary | ICD-10-CM | POA: Diagnosis present

## 2018-08-16 HISTORY — DX: Essential (primary) hypertension: I10

## 2018-08-16 MED ORDER — IOHEXOL 300 MG/ML  SOLN
150.0000 mL | Freq: Once | INTRAMUSCULAR | Status: AC | PRN
  Administered 2018-08-16: 125 mL via INTRAVENOUS

## 2018-10-13 ENCOUNTER — Other Ambulatory Visit: Payer: Self-pay

## 2018-10-13 ENCOUNTER — Ambulatory Visit (INDEPENDENT_AMBULATORY_CARE_PROVIDER_SITE_OTHER): Payer: Medicare HMO | Admitting: Urology

## 2018-10-13 ENCOUNTER — Encounter: Payer: Self-pay | Admitting: Urology

## 2018-10-13 VITALS — BP 115/79 | HR 76 | Ht 71.0 in | Wt 161.0 lb

## 2018-10-13 DIAGNOSIS — N200 Calculus of kidney: Secondary | ICD-10-CM | POA: Insufficient documentation

## 2018-10-13 DIAGNOSIS — N401 Enlarged prostate with lower urinary tract symptoms: Secondary | ICD-10-CM | POA: Diagnosis not present

## 2018-10-13 DIAGNOSIS — N402 Nodular prostate without lower urinary tract symptoms: Secondary | ICD-10-CM

## 2018-10-13 LAB — URINALYSIS, COMPLETE
Bilirubin, UA: NEGATIVE
Glucose, UA: NEGATIVE
Ketones, UA: NEGATIVE
Leukocytes,UA: NEGATIVE
Nitrite, UA: NEGATIVE
Protein,UA: NEGATIVE
Specific Gravity, UA: 1.02 (ref 1.005–1.030)
Urobilinogen, Ur: 0.2 mg/dL (ref 0.2–1.0)
pH, UA: 7 (ref 5.0–7.5)

## 2018-10-13 LAB — MICROSCOPIC EXAMINATION: Bacteria, UA: NONE SEEN

## 2018-10-13 LAB — BLADDER SCAN AMB NON-IMAGING

## 2018-10-13 NOTE — Progress Notes (Signed)
10/13/2018 11:01 AM   Darius Miller Dec 13, 1945 [id number]  Referring provider: Gracelyn Nurse, MD [address] [place],  [place] [address]  Chief Complaint  Patient presents with  . nodular prostate    HPI: Darius Miller is a 73 y.o. male seen at the request of Dr. Letitia Libra for evaluation of an abnormal CT of the pelvis.  He had a CT of the abdomen pelvis scheduled in June 2020 for abdominal distention. CT showed a mass-effect of the bladder base by an enlarged median lobe of the prostate and urology evaluation was recommended.  He was also noted to have bilateral, nonobstructing renal calculi and bilateral simple renal cysts.  He does have a history of microhematuria.  He has previously been followed by urology in [place] and underwent cystoscopy in 2016 which showed a small median lobe of the prostate and no bladder mucosal abnormalities.  CT in 2016 did show bilateral renal calculi.  He underwent an open pyelolithotomy in 2011 for a 3-4 cm left renal calculus after failed PCNL.  He denies gross hematuria.  He has mild lower urinary tract symptoms of occasional frequency and nocturia x2.  IPSS completed today was 3/2.  He is on chronic Coumadin therapy after a mitral valve repair.   PMH: Past Medical History:  Diagnosis Date  . Atrial fibrillation (HCC)   . Coronary artery disease  s/p RCA stents   . History of mitral valve repair   . Hypertension   . 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 Kidney stones   . Stroke (HCC)   . Ventricular tachycardia [facility])     Surgical History: Past Surgical History:  Procedure Laterality Date  . CARDIAC CATHETERIZATION    . CORONARY ANGIOPLASTY    . Coronary stent 2009  2009  . ICD IMPLANT    . MITRAL VALVE REPAIR  October 16, 2014    Home Medications:  Allergies as of 10/13/2018      Reactions   Flomax [tamsulosin Hcl]       Medication List       Accurate as of  October 13, 2018 11:01 AM. If you have any questions, ask your nurse or doctor.        STOP taking these medications   warfarin 2 MG tablet Commonly known as: COUMADIN Stopped by: Riki Altes, MD     TAKE these medications   acetaminophen 500 MG tablet Commonly known as: TYLENOL Take 1 tablet by mouth every 8 (eight) hours as needed.   amiodarone 400 MG tablet Commonly known as: PACERONE Take 1 tablet (400 mg total) by mouth 2 (two) times daily.   aspirin 81 MG tablet Take 1 tablet (81 mg total) by mouth daily.   atorvastatin 20 MG tablet Commonly known as: LIPITOR Take 20 mg by mouth daily.   dabigatran 150 MG Caps capsule Commonly known as: PRADAXA Take 1 capsule (150 mg total) by mouth 2 (two) times daily.   ferrous sulfate 325 (65 FE) MG tablet Take by mouth.   furosemide 40 MG tablet Commonly known as: LASIX Take 1 tablet (40 mg total) by mouth daily.   lisinopril 2.5 MG tablet Commonly known as: ZESTRIL Take by mouth.   losartan 25 MG tablet Commonly known as: COZAAR Take 0.5 tablets (12.5 mg total) by mouth daily.   magnesium oxide 400 MG tablet Commonly known as: MAG-OX Take 1 tablet by mouth 2 (two) times daily.   metoprolol  succinate 25 MG 24 hr tablet Commonly known as: TOPROL-XL Take 0.5 tablets (12.5 mg total) by mouth 2 (two) times daily.   mexiletine 200 MG capsule Commonly known as: MEXITIL Take 1 capsule (200 mg total) by mouth every 12 (twelve) hours.   nitroGLYCERIN 0.4 MG SL tablet Commonly known as: NITROSTAT Place 1 tablet (0.4 mg total) under the tongue every 5 (five) minutes as needed for chest pain.   Trelegy Ellipta 100-62.5-25 MCG/INH Aepb Generic drug: Fluticasone-Umeclidin-Vilant INHALE 1 PUFF INTO THE LUNGS ONCE DAILY   venlafaxine XR 75 MG 24 hr capsule Commonly known as: EFFEXOR-XR Take by mouth daily.       Allergies:  Allergies  Allergen Reactions  . Flomax [Tamsulosin Hcl]     Family History: No family  history on file.  Social History:  reports that he has quit smoking. His smoking use included cigarettes. He has a 40.00 pack-year smoking history. He has never used smokeless tobacco. No history on file for alcohol and drug.  ROS: UROLOGY Frequent Urination?: Yes Hard to postpone urination?: No Burning/pain with urination?: No Get up at night to urinate?: No Leakage of urine?: No Urine stream starts and stops?: No Trouble starting stream?: No Do you have to strain to urinate?: No Blood in urine?: No Urinary tract infection?: No Sexually transmitted disease?: No Injury to kidneys or bladder?: No Painful intercourse?: No Weak stream?: No Erection problems?: No Penile pain?: No  Gastrointestinal Nausea?: No Vomiting?: No Indigestion/heartburn?: No Diarrhea?: No Constipation?: No  Constitutional Fever: No Night sweats?: No Weight loss?: Yes Fatigue?: No  Skin Skin rash/lesions?: No Itching?: No  Eyes Blurred vision?: No Double vision?: No  Ears/Nose/Throat Sore throat?: No Sinus problems?: Yes  Hematologic/Lymphatic Swollen glands?: No Easy bruising?: Yes  Cardiovascular Leg swelling?: No Chest pain?: No  Respiratory Cough?: No Shortness of breath?: Yes  Endocrine Excessive thirst?: No  Musculoskeletal Back pain?: Yes Joint pain?: No  Neurological Headaches?: No Dizziness?: No  Psychologic Depression?: No Anxiety?: No  Physical Exam: BP 115/79 (BP Location: Left Arm, Patient Position: Sitting)   Pulse 76   Ht 5\' 11"  (1.803 m)   Wt 161 lb (73 kg)   BMI 22.45 kg/m   Constitutional:  Alert and oriented, No acute distress. HEENT: [place] AT, moist mucus membranes.  Trachea midline, no masses. Cardiovascular: No clubbing, cyanosis, or edema. Respiratory: Normal respiratory effort, no increased work of breathing. GI: Abdomen is soft, nontender, nondistended, no abdominal masses GU: Prostate 60 g, smooth without nodules Lymph: No cervical or  inguinal lymphadenopathy. Skin: No rashes, bruises or suspicious lesions. Neurologic: Grossly intact, no focal deficits, moving all 4 extremities. Psychiatric: Normal mood and affect.  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  CT shows a small median lobe but no suspicious masses.  PVR by bladder scan today was 69 mL.  - Nephrolithiasis Bilateral nonobstructing renal calculi.  Recommend follow-up KUB 1 year  - Microhematuria Potential etiologies of BPH and renal calculi.  Relatively recent cystoscopy was felt consistent with a prostatic source.  Repeat cystoscopy for worsening microhematuria or development of gross hematuria.   Abbie Sons, [place] [address] [place], [place] [address] [phone]

## 2018-10-22 ENCOUNTER — Emergency Department: Payer: Medicare HMO

## 2018-10-22 ENCOUNTER — Inpatient Hospital Stay
Admission: EM | Admit: 2018-10-22 | Discharge: 2018-11-09 | DRG: 183 | Disposition: A | Discharge status: Skilled Nursing Facility | Payer: Medicare HMO | Attending: Internal Medicine | Admitting: Internal Medicine

## 2018-10-22 ENCOUNTER — Other Ambulatory Visit: Payer: Self-pay

## 2018-10-22 DIAGNOSIS — G92 Toxic encephalopathy: Secondary | ICD-10-CM | POA: Diagnosis present

## 2018-10-22 DIAGNOSIS — J9600 Acute respiratory failure, unspecified whether with hypoxia or hypercapnia: Secondary | ICD-10-CM

## 2018-10-22 DIAGNOSIS — Z79899 Other long term (current) drug therapy: Secondary | ICD-10-CM

## 2018-10-22 DIAGNOSIS — Z515 Encounter for palliative care: Secondary | ICD-10-CM | POA: Diagnosis present

## 2018-10-22 DIAGNOSIS — Z7982 Long term (current) use of aspirin: Secondary | ICD-10-CM

## 2018-10-22 DIAGNOSIS — R0602 Shortness of breath: Secondary | ICD-10-CM

## 2018-10-22 DIAGNOSIS — S2249XA Multiple fractures of ribs, unspecified side, initial encounter for closed fracture: Secondary | ICD-10-CM | POA: Diagnosis present

## 2018-10-22 DIAGNOSIS — Z87891 Personal history of nicotine dependence: Secondary | ICD-10-CM

## 2018-10-22 DIAGNOSIS — J9811 Atelectasis: Secondary | ICD-10-CM | POA: Diagnosis present

## 2018-10-22 DIAGNOSIS — I5043 Acute on chronic combined systolic (congestive) and diastolic (congestive) heart failure: Secondary | ICD-10-CM | POA: Diagnosis present

## 2018-10-22 DIAGNOSIS — R509 Fever, unspecified: Secondary | ICD-10-CM

## 2018-10-22 DIAGNOSIS — I2510 Atherosclerotic heart disease of native coronary artery without angina pectoris: Secondary | ICD-10-CM | POA: Diagnosis present

## 2018-10-22 DIAGNOSIS — W1830XA Fall on same level, unspecified, initial encounter: Secondary | ICD-10-CM | POA: Diagnosis present

## 2018-10-22 DIAGNOSIS — J984 Other disorders of lung: Secondary | ICD-10-CM

## 2018-10-22 DIAGNOSIS — J9690 Respiratory failure, unspecified, unspecified whether with hypoxia or hypercapnia: Secondary | ICD-10-CM

## 2018-10-22 DIAGNOSIS — Z20828 Contact with and (suspected) exposure to other viral communicable diseases: Secondary | ICD-10-CM | POA: Diagnosis present

## 2018-10-22 DIAGNOSIS — Z951 Presence of aortocoronary bypass graft: Secondary | ICD-10-CM

## 2018-10-22 DIAGNOSIS — Z95810 Presence of automatic (implantable) cardiac defibrillator: Secondary | ICD-10-CM

## 2018-10-22 DIAGNOSIS — Z955 Presence of coronary angioplasty implant and graft: Secondary | ICD-10-CM

## 2018-10-22 DIAGNOSIS — S2241XA Multiple fractures of ribs, right side, initial encounter for closed fracture: Secondary | ICD-10-CM | POA: Diagnosis not present

## 2018-10-22 DIAGNOSIS — R42 Dizziness and giddiness: Secondary | ICD-10-CM

## 2018-10-22 DIAGNOSIS — Z87442 Personal history of urinary calculi: Secondary | ICD-10-CM

## 2018-10-22 DIAGNOSIS — I110 Hypertensive heart disease with heart failure: Secondary | ICD-10-CM | POA: Diagnosis present

## 2018-10-22 DIAGNOSIS — Z888 Allergy status to other drugs, medicaments and biological substances status: Secondary | ICD-10-CM

## 2018-10-22 DIAGNOSIS — J9601 Acute respiratory failure with hypoxia: Secondary | ICD-10-CM | POA: Diagnosis present

## 2018-10-22 DIAGNOSIS — I4821 Permanent atrial fibrillation: Secondary | ICD-10-CM | POA: Diagnosis present

## 2018-10-22 DIAGNOSIS — S2239XA Fracture of one rib, unspecified side, initial encounter for closed fracture: Secondary | ICD-10-CM | POA: Diagnosis present

## 2018-10-22 DIAGNOSIS — S0990XA Unspecified injury of head, initial encounter: Secondary | ICD-10-CM

## 2018-10-22 DIAGNOSIS — Z8673 Personal history of transient ischemic attack (TIA), and cerebral infarction without residual deficits: Secondary | ICD-10-CM

## 2018-10-22 DIAGNOSIS — I509 Heart failure, unspecified: Secondary | ICD-10-CM

## 2018-10-22 DIAGNOSIS — Z952 Presence of prosthetic heart valve: Secondary | ICD-10-CM

## 2018-10-22 DIAGNOSIS — I472 Ventricular tachycardia: Secondary | ICD-10-CM | POA: Diagnosis present

## 2018-10-22 DIAGNOSIS — I480 Paroxysmal atrial fibrillation: Secondary | ICD-10-CM | POA: Diagnosis present

## 2018-10-22 DIAGNOSIS — Z7189 Other specified counseling: Secondary | ICD-10-CM

## 2018-10-22 DIAGNOSIS — Z66 Do not resuscitate: Secondary | ICD-10-CM | POA: Diagnosis present

## 2018-10-22 DIAGNOSIS — Z7901 Long term (current) use of anticoagulants: Secondary | ICD-10-CM

## 2018-10-22 DIAGNOSIS — W19XXXA Unspecified fall, initial encounter: Secondary | ICD-10-CM

## 2018-10-22 DIAGNOSIS — J449 Chronic obstructive pulmonary disease, unspecified: Secondary | ICD-10-CM | POA: Diagnosis present

## 2018-10-22 LAB — CBC WITH DIFFERENTIAL/PLATELET
Abs Immature Granulocytes: 0.03 10*3/uL (ref 0.00–0.07)
Basophils Absolute: 0 10*3/uL (ref 0.0–0.1)
Basophils Relative: 1 %
Eosinophils Absolute: 0 10*3/uL (ref 0.0–0.5)
Eosinophils Relative: 0 %
HCT: 45.7 % (ref 39.0–52.0)
Hemoglobin: 14.5 g/dL (ref 13.0–17.0)
Immature Granulocytes: 1 %
Lymphocytes Relative: 13 %
Lymphs Abs: 0.8 10*3/uL (ref 0.7–4.0)
MCH: 30.5 pg (ref 26.0–34.0)
MCHC: 31.7 g/dL (ref 30.0–36.0)
MCV: 96.2 fL (ref 80.0–100.0)
Monocytes Absolute: 0.5 10*3/uL (ref 0.1–1.0)
Monocytes Relative: 8 %
Neutro Abs: 4.6 10*3/uL (ref 1.7–7.7)
Neutrophils Relative %: 77 %
Platelets: 180 10*3/uL (ref 150–400)
RBC: 4.75 MIL/uL (ref 4.22–5.81)
RDW: 14.2 % (ref 11.5–15.5)
WBC: 6 10*3/uL (ref 4.0–10.5)
nRBC: 0 % (ref 0.0–0.2)

## 2018-10-22 LAB — COMPREHENSIVE METABOLIC PANEL
ALT: 37 U/L (ref 0–44)
AST: 44 U/L — ABNORMAL HIGH (ref 15–41)
Albumin: 3.7 g/dL (ref 3.5–5.0)
Alkaline Phosphatase: 117 U/L (ref 38–126)
Anion gap: 9 (ref 5–15)
BUN: 14 mg/dL (ref 8–23)
CO2: 30 mmol/L (ref 22–32)
Calcium: 8.9 mg/dL (ref 8.9–10.3)
Chloride: 101 mmol/L (ref 98–111)
Creatinine, Ser: 1.13 mg/dL (ref 0.61–1.24)
GFR calc Af Amer: >60 mL/min (ref >60)
GFR calc non Af Amer: >60 mL/min (ref >60)
Glucose, Bld: 128 mg/dL — ABNORMAL HIGH (ref 70–99)
Potassium: 3.7 mmol/L (ref 3.5–5.1)
Sodium: 140 mmol/L (ref 135–145)
Total Bilirubin: 0.9 mg/dL (ref 0.3–1.2)
Total Protein: 7.2 g/dL (ref 6.5–8.1)

## 2018-10-22 LAB — URINALYSIS, COMPLETE (UACMP) WITH MICROSCOPIC
Bilirubin Urine: NEGATIVE
Glucose, UA: NEGATIVE mg/dL
Ketones, ur: NEGATIVE mg/dL
Nitrite: NEGATIVE
Protein, ur: 30 mg/dL — AB
Specific Gravity, Urine: 1.013 (ref 1.005–1.030)
pH: 7 (ref 5.0–8.0)

## 2018-10-22 LAB — PROCALCITONIN: Procalcitonin: <0.1 ng/mL

## 2018-10-22 LAB — SARS CORONAVIRUS 2 BY RT PCR (HOSPITAL ORDER, PERFORMED IN ~~LOC~~ HOSPITAL LAB): SARS Coronavirus 2: NEGATIVE

## 2018-10-22 LAB — TROPONIN I (HIGH SENSITIVITY): Troponin I (High Sensitivity): 16 ng/L (ref <18)

## 2018-10-22 LAB — LACTIC ACID, PLASMA: Lactic Acid, Venous: 1.7 mmol/L (ref 0.5–1.9)

## 2018-10-22 LAB — GLUCOSE, CAPILLARY: Glucose-Capillary: 109 mg/dL — ABNORMAL HIGH (ref 70–99)

## 2018-10-22 MED ORDER — FLUTICASONE FUROATE-VILANTEROL 100-25 MCG/INH IN AEPB
1.0000 | INHALATION_SPRAY | Freq: Every day | RESPIRATORY_TRACT | Status: DC
  Administered 2018-10-24 – 2018-10-26 (×3): 1 via RESPIRATORY_TRACT
  Filled 2018-10-22: qty 28

## 2018-10-22 MED ORDER — MEXILETINE HCL 200 MG PO CAPS
200.0000 mg | ORAL_CAPSULE | Freq: Two times a day (BID) | ORAL | Status: DC
  Administered 2018-10-22 – 2018-11-09 (×35): 200 mg via ORAL
  Filled 2018-10-22 (×38): qty 1

## 2018-10-22 MED ORDER — AMIODARONE HCL 200 MG PO TABS
400.0000 mg | ORAL_TABLET | Freq: Two times a day (BID) | ORAL | Status: DC
  Administered 2018-10-23 – 2018-10-25 (×5): 400 mg via ORAL
  Filled 2018-10-22 (×5): qty 2

## 2018-10-22 MED ORDER — VENLAFAXINE HCL ER 75 MG PO CP24
75.0000 mg | ORAL_CAPSULE | Freq: Every day | ORAL | Status: DC
  Administered 2018-10-23 – 2018-10-29 (×7): 75 mg via ORAL
  Filled 2018-10-22 (×7): qty 1

## 2018-10-22 MED ORDER — FLUTICASONE-UMECLIDIN-VILANT 100-62.5-25 MCG/INH IN AEPB: 1.0000 | INHALATION_SPRAY | Freq: Every day | RESPIRATORY_TRACT | Status: DC

## 2018-10-22 MED ORDER — ATORVASTATIN CALCIUM 20 MG PO TABS
20.0000 mg | ORAL_TABLET | Freq: Every day | ORAL | Status: DC
  Administered 2018-10-23 – 2018-11-09 (×19): 20 mg via ORAL
  Filled 2018-10-22 (×19): qty 1

## 2018-10-22 MED ORDER — ACETAMINOPHEN 650 MG RE SUPP: 650.0000 mg | Freq: Four times a day (QID) | RECTAL | Status: DC | PRN

## 2018-10-22 MED ORDER — FUROSEMIDE 40 MG PO TABS
40.0000 mg | ORAL_TABLET | Freq: Every day | ORAL | Status: DC
  Administered 2018-10-23 – 2018-10-24 (×2): 40 mg via ORAL
  Filled 2018-10-22 (×2): qty 1

## 2018-10-22 MED ORDER — UMECLIDINIUM BROMIDE 62.5 MCG/INH IN AEPB
1.0000 | INHALATION_SPRAY | Freq: Every day | RESPIRATORY_TRACT | Status: DC
  Administered 2018-10-24 – 2018-10-26 (×3): 1 via RESPIRATORY_TRACT
  Filled 2018-10-22: qty 7

## 2018-10-22 MED ORDER — OXYCODONE HCL 5 MG PO TABS
5.0000 mg | ORAL_TABLET | ORAL | Status: DC | PRN
  Administered 2018-10-23 – 2018-11-08 (×9): 5 mg via ORAL
  Filled 2018-10-22 (×9): qty 1

## 2018-10-22 MED ORDER — ACETAMINOPHEN 325 MG PO TABS
650.0000 mg | ORAL_TABLET | Freq: Four times a day (QID) | ORAL | Status: DC | PRN
  Administered 2018-10-22 – 2018-11-09 (×2): 650 mg via ORAL
  Filled 2018-10-22 (×3): qty 2

## 2018-10-22 MED ORDER — ONDANSETRON HCL 4 MG/2ML IJ SOLN: 4.0000 mg | Freq: Four times a day (QID) | INTRAMUSCULAR | Status: DC | PRN

## 2018-10-22 MED ORDER — SODIUM CHLORIDE 0.9 % IV SOLN
1.0000 g | INTRAVENOUS | Status: DC
  Administered 2018-10-22 – 2018-10-24 (×3): 1 g via INTRAVENOUS
  Filled 2018-10-22: qty 1
  Filled 2018-10-22 (×2): qty 10
  Filled 2018-10-22 (×2): qty 1

## 2018-10-22 MED ORDER — ASPIRIN EC 81 MG PO TBEC: 81.0000 mg | DELAYED_RELEASE_TABLET | Freq: Every day | ORAL | Status: DC

## 2018-10-22 MED ORDER — BACITRACIN ZINC 500 UNIT/GM EX OINT
TOPICAL_OINTMENT | Freq: Every day | CUTANEOUS | Status: DC
  Administered 2018-10-22 – 2018-11-06 (×10): 1 via TOPICAL
  Filled 2018-10-22 (×7): qty 0.9

## 2018-10-22 MED ORDER — DABIGATRAN ETEXILATE MESYLATE 150 MG PO CAPS
150.0000 mg | ORAL_CAPSULE | Freq: Two times a day (BID) | ORAL | Status: DC
  Administered 2018-10-22 – 2018-11-09 (×35): 150 mg via ORAL
  Filled 2018-10-22 (×37): qty 1

## 2018-10-22 MED ORDER — ONDANSETRON HCL 4 MG PO TABS: 4.0000 mg | ORAL_TABLET | Freq: Four times a day (QID) | ORAL | Status: DC | PRN

## 2018-10-22 MED ORDER — SODIUM CHLORIDE 0.9 % IV SOLN
500.0000 mg | INTRAVENOUS | Status: DC
  Administered 2018-10-22 – 2018-10-24 (×4): 500 mg via INTRAVENOUS
  Filled 2018-10-22 (×5): qty 500

## 2018-10-22 MED ORDER — MORPHINE SULFATE (PF) 2 MG/ML IV SOLN
2.0000 mg | Freq: Once | INTRAVENOUS | Status: AC
  Administered 2018-10-22: 2 mg via INTRAVENOUS
  Filled 2018-10-22: qty 1

## 2018-10-22 MED ORDER — POLYETHYLENE GLYCOL 3350 17 G PO PACK
17.0000 g | PACK | Freq: Every day | ORAL | Status: DC | PRN
  Administered 2018-10-24: 17 g via ORAL
  Filled 2018-10-22: qty 1

## 2018-10-22 MED ORDER — ONDANSETRON HCL 4 MG/2ML IJ SOLN
4.0000 mg | Freq: Once | INTRAMUSCULAR | Status: AC
  Administered 2018-10-22: 4 mg via INTRAVENOUS
  Filled 2018-10-22: qty 2

## 2018-10-22 MED ORDER — METOPROLOL SUCCINATE ER 25 MG PO TB24
12.5000 mg | ORAL_TABLET | Freq: Two times a day (BID) | ORAL | Status: DC
  Administered 2018-10-22 – 2018-11-09 (×26): 12.5 mg via ORAL
  Filled 2018-10-22: qty 1
  Filled 2018-10-22 (×2): qty 0.5
  Filled 2018-10-22 (×4): qty 1
  Filled 2018-10-22: qty 0.5
  Filled 2018-10-22: qty 1
  Filled 2018-10-22 (×2): qty 0.5
  Filled 2018-10-22: qty 1
  Filled 2018-10-22 (×3): qty 0.5
  Filled 2018-10-22: qty 1
  Filled 2018-10-22 (×4): qty 0.5
  Filled 2018-10-22 (×2): qty 1
  Filled 2018-10-22: qty 0.5
  Filled 2018-10-22: qty 1
  Filled 2018-10-22 (×6): qty 0.5
  Filled 2018-10-22: qty 1
  Filled 2018-10-22 (×2): qty 0.5

## 2018-10-22 MED ORDER — ASPIRIN EC 81 MG PO TBEC
81.0000 mg | DELAYED_RELEASE_TABLET | Freq: Every day | ORAL | Status: DC
  Administered 2018-10-23 – 2018-11-09 (×18): 81 mg via ORAL
  Filled 2018-10-22 (×18): qty 1

## 2018-10-22 MED ORDER — MORPHINE SULFATE (PF) 2 MG/ML IV SOLN
2.0000 mg | INTRAVENOUS | Status: DC | PRN
  Administered 2018-10-22 – 2018-10-26 (×2): 2 mg via INTRAVENOUS
  Filled 2018-10-22 (×2): qty 1

## 2018-10-22 MED ORDER — SODIUM CHLORIDE 0.9 % IV BOLUS
500.0000 mL | Freq: Once | INTRAVENOUS | Status: AC
  Administered 2018-10-22: 500 mL via INTRAVENOUS

## 2018-10-22 NOTE — ED Triage Notes (Signed)
Pt fell while walking dog. Pt states he does not remember why he fell, denies LOC. Abrasions to forehead, back of head,  bridge of nose, right calf, right elbow. Pt c/o left hip pain.

## 2018-10-22 NOTE — ED Notes (Signed)
Abrasion to forehead and back of head cleaned, bacitracin applied, non stick dressing applied.

## 2018-10-22 NOTE — ED Notes (Signed)
Pt may eat and drink per EDP. Pt given sandwich, tolerating well.

## 2018-10-22 NOTE — ED Notes (Signed)
ED TO INPATIENT HANDOFF REPORT  ED Nurse Name and Phone #: Marisue HumbleMaureen [phone]  S Name/Age/Gender Darius FreshwaterJames W. Miller 73 y.o. male Room/Bed: ED08A/ED08A  Code Status   Code Status: Prior  Home/SNF/Other Home Patient oriented to: self, place, time and situation Is this baseline? Yes   Triage Complete: Triage complete  Chief Complaint Fall  Triage Note Pt fell while walking dog. Pt states he does not remember why he fell, denies LOC. Abrasions to forehead, back of head,  bridge of nose, right calf, right elbow. Pt c/o left hip pain.   Allergies Allergies  Allergen Reactions  . Flomax [Tamsulosin Hcl]     Level of Care/Admitting Diagnosis ED Disposition    None      B Medical/Surgery History Past Medical History:  Diagnosis Date  . Atrial fibrillation (HCC)   . Coronary artery disease  s/p RCA stents   . History of mitral valve repair   . Hypertension   . 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 Kidney stones   . Stroke (HCC)   . Ventricular tachycardia [facility](HCC)    Past Surgical History:  Procedure Laterality Date  . CARDIAC CATHETERIZATION    . CORONARY ANGIOPLASTY    . Coronary stent 2009  2009  . ICD IMPLANT    . MITRAL VALVE REPAIR  October 16, 2014     A IV Location/Drains/Wounds Patient Lines/Drains/Airways Status   Active Line/Drains/Airways    Name:   Placement date:   Placement time:   Site:   Days:   Peripheral IV 10/22/18 Left Antecubital   10/22/18    1208    Antecubital   less than 1   Peripheral IV 10/22/18 Right Wrist   10/22/18    -    Wrist   less than 1          Intake/Output Last 24 hours No intake or output data in the 24 hours ending 10/22/18 1420  Labs/Imaging Results for orders placed or performed during the hospital encounter of 10/22/18 (from the past 48 hour(s))  CBC with Differential     Status: None   Collection Time: 10/22/18 10:40 AM  Result Value Ref Range   WBC 6.0 4.0 - 10.5  K/uL   RBC 4.75 4.22 - 5.81 MIL/uL   Hemoglobin 14.5 13.0 - 17.0 g/dL   HCT 16.145.7 09.639.0 - 04.552.0 %   MCV 96.2 80.0 - 100.0 fL   MCH 30.5 26.0 - 34.0 pg   MCHC 31.7 30.0 - 36.0 g/dL   RDW 40.914.2 81.111.5 - 91.415.5 %   Platelets 180 150 - 400 K/uL   nRBC 0.0 0.0 - 0.2 %   Neutrophils Relative % 77 %   Neutro Abs 4.6 1.7 - 7.7 K/uL   Lymphocytes Relative 13 %   Lymphs Abs 0.8 0.7 - 4.0 K/uL   Monocytes Relative 8 %   Monocytes Absolute 0.5 0.1 - 1.0 K/uL   Eosinophils Relative 0 %   Eosinophils Absolute 0.0 0.0 - 0.5 K/uL   Basophils Relative 1 %   Basophils Absolute 0.0 0.0 - 0.1 K/uL   Immature Granulocytes 1 %   Abs Immature Granulocytes 0.03 0.00 - 0.07 K/uL    Comment: Performed at [facility], [address] [place], [place] [address]  Comprehensive metabolic panel     Status: Abnormal   Collection Time: 10/22/18 10:40 AM  Result Value Ref Range   Sodium 140 135 - 145 mmol/L  Potassium 3.7 3.5 - 5.1 mmol/L   Chloride 101 98 - 111 mmol/L   CO2 30 22 - 32 mmol/L   Glucose, Bld 128 (H) 70 - 99 mg/dL   BUN 14 8 - 23 mg/dL   Creatinine, Ser 0.981.13 0.61 - 1.24 mg/dL   Calcium 8.9 8.9 - 11.910.3 mg/dL   Total Protein 7.2 6.5 - 8.1 g/dL   Albumin 3.7 3.5 - 5.0 g/dL   AST 44 (H) 15 - 41 U/L   ALT 37 0 - 44 U/L   Alkaline Phosphatase 117 38 - 126 U/L   Total Bilirubin 0.9 0.3 - 1.2 mg/dL   GFR calc non Af Amer >60 >60 mL/min   GFR calc Af Amer >60 >60 mL/min   Anion gap 9 5 - 15    Comment: Performed at [facility], [address] [place], [place] [address]  Lactic acid, plasma     Status: None   Collection Time: 10/22/18 10:40 AM  Result Value Ref Range   Lactic Acid, Venous 1.7 0.5 - 1.9 mmol/L    Comment: Performed at [facility], [address] [place], [place] [address]  Troponin I (High Sensitivity)     Status: None   Collection Time: 10/22/18 10:40 AM  Result Value Ref Range   Troponin I (High Sensitivity) 16 <18 ng/L    Comment: (NOTE) Elevated  high sensitivity troponin I (hsTnI) values and significant  changes across serial measurements may suggest ACS but many other  chronic and acute conditions are known to elevate hsTnI results.  Refer to the "Links" section for chest pain algorithms and additional  guidance. Performed at [facility], [address] [place], [place] [address]   SARS Coronavirus 2 [facility](Hospital order, Performed in [facility] lab) Nasopharyngeal Nasopharyngeal Swab     Status: None   Collection Time: 10/22/18 11:28 AM   Specimen: Nasopharyngeal Swab  Result Value Ref Range   SARS Coronavirus 2 NEGATIVE NEGATIVE    Comment: (NOTE) If result is NEGATIVE SARS-CoV-2 target nucleic acids are NOT DETECTED. The SARS-CoV-2 RNA is generally detectable in upper and lower  respiratory specimens during the acute phase of infection. The lowest  concentration of SARS-CoV-2 viral copies this assay can detect is 250  copies / mL. A negative result does not preclude SARS-CoV-2 infection  and should not be used as the sole basis for treatment or other  patient management decisions.  A negative result may occur with  improper specimen collection / handling, submission of specimen other  than nasopharyngeal swab, presence of viral mutation(s) within the  areas targeted by this assay, and inadequate number of viral copies  (<250 copies / mL). A negative result must be combined with clinical  observations, patient history, and epidemiological information. If result is POSITIVE SARS-CoV-2 target nucleic acids are DETECTED. The SARS-CoV-2 RNA is generally detectable in upper and lower  respiratory specimens dur ing the acute phase of infection.  Positive  results are indicative of active infection with SARS-CoV-2.  Clinical  correlation with patient history and other diagnostic information is  necessary to determine patient infection status.  Positive results do  not rule out bacterial infection or  co-infection with other viruses. If result is PRESUMPTIVE POSTIVE SARS-CoV-2 nucleic acids MAY BE PRESENT.   A presumptive positive result was obtained on the submitted specimen  and confirmed on repeat testing.  While 2019 novel coronavirus  (SARS-CoV-2) nucleic acids may be present in the submitted sample  additional confirmatory testing may be necessary for epidemiological  and / or clinical management purposes  to differentiate between  SARS-CoV-2 and other Sarbecovirus currently known to infect humans.  If clinically indicated additional testing with an alternate test  methodology [phone]) is advised. The SARS-CoV-2 RNA is generally  detectable in upper and lower respiratory sp ecimens during the acute  phase of infection. The expected result is Negative. Fact Sheet for Patients:  StrictlyIdeas.no Fact Sheet for Healthcare Providers: BankingDealers.co.za This test is not yet approved or cleared by the [place] FDA and has been authorized for detection and/or diagnosis of SARS-CoV-2 by FDA under an Emergency Use Authorization (EUA).  This EUA will remain in effect (meaning this test can be used) for the duration of the COVID-19 declaration under Section 564(b)(1) of the Act, 21 U.S.C. section 360bbb-3(b)(1), unless the authorization is terminated or revoked sooner. Performed at [facility], [address] [place], [place] [address]   Urinalysis, Complete w Microscopic     Status: Abnormal   Collection Time: 10/22/18 12:08 PM  Result Value Ref Range   Color, Urine YELLOW (A) YELLOW   APPearance HAZY (A) CLEAR   Specific Gravity, Urine 1.013 1.005 - 1.030   pH 7.0 5.0 - 8.0   Glucose, UA NEGATIVE NEGATIVE mg/dL   Hgb urine dipstick MODERATE (A) NEGATIVE   Bilirubin Urine NEGATIVE NEGATIVE   Ketones, ur NEGATIVE NEGATIVE mg/dL   Protein, ur 30 (A) NEGATIVE mg/dL   Nitrite NEGATIVE NEGATIVE   Leukocytes,Ua TRACE  (A) NEGATIVE   RBC / HPF 21-50 0 - 5 RBC/hpf   WBC, UA 0-5 0 - 5 WBC/hpf   Bacteria, UA RARE (A) NONE SEEN   Squamous Epithelial / LPF 0-5 0 - 5    Comment: Performed at [facility], [address], [place], [place] [address]   Dg Ribs Unilateral W/chest Right  Result Date: 10/22/2018 CLINICAL DATA:  73 year old male with right lateral rib pain after falling EXAM: RIGHT RIBS AND CHEST - 3+ VIEW COMPARISON:  Most recent prior chest x-ray 01/18/2018 FINDINGS: Acute nondisplaced fracture of the anterolateral aspect of the right second rib. Slight angulation of the anterolateral aspect of the right third in lieu rib is also evident and favored to represent an additional nondisplaced fracture. Left subclavian approach biventricular cardiac rhythm maintenance device. Leads project over the right ventricle and overlying the left heart. Stable cardiomegaly. Atherosclerotic calcifications in the aorta. 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 No pneumothorax or pleural effusion. Stable appearance of the lungs. IMPRESSION: 1. Acute nondisplaced fracture of the anterolateral aspect of the right second rib. 2. Also suspect a nondisplaced fracture of the anterolateral aspect of the right third rib. 3. No evidence of underlying pneumothorax or pleural effusion. 4. Otherwise, stable chest x-ray with cardiomegaly, pulmonary vascular congestion and bilateral calcified pleural plaques. Electronically Signed   By: Jacqulynn Cadet M.D.   On: 10/22/2018 13:44   Ct Head Wo Contrast  Result Date: 10/22/2018 CLINICAL DATA:  Pt fell while walking dog. Pt states he does not remember why he fell, denies LOC. Abrasions to forehead, back of head, bridge of noseC-spine trauma, high clinical risk (NEXUS/CCR); Head trauma, minor, GCS>=13, high clinical risk, initial exam EXAM: CT HEAD WITHOUT CONTRAST CT CERVICAL SPINE WITHOUT CONTRAST TECHNIQUE: Multidetector CT imaging of the  head and cervical spine was performed following the standard protocol without intravenous contrast. Multiplanar CT image reconstructions of the cervical spine were also generated. COMPARISON:  None. FINDINGS: CT HEAD FINDINGS Brain: No intracranial hemorrhage. No parenchymal contusion. No midline shift or mass effect. Basilar cisterns are patent. No skull base fracture. No fluid in the paranasal sinuses or mastoid air cells. Orbits are normal. Mild periventricular white matter hypodensities. Vascular: No hyperdense vessel or unexpected calcification. Skull: Normal. Negative for fracture or focal lesion. Sinuses/Orbits: Paranasal sinuses and mastoid air cells are clear. Orbits are clear. Other: None. CT CERVICAL SPINE FINDINGS Alignment: Normal alignment of the cervical vertebral bodies. Skull base and vertebrae: Normal craniocervical junction. No loss of vertebral body height or disc height. Normal facet articulation. No evidence of fracture. Soft tissues and spinal canal: No prevertebral soft tissue swelling. No perispinal or epidural hematoma. Disc levels: Mild endplate spurring and joint space narrowing at C5-C7. No subluxation. Upper chest: Clear Other: None IMPRESSION: 1. No intracranial trauma. 2. Mild white matter micro vascular disease. 3. No cervical spine fracture. Electronically Signed   By: Genevive Bi M.D.   On: 10/22/2018 11:48   Ct Cervical Spine Wo Contrast  Result Date: 10/22/2018 CLINICAL DATA:  Pt fell while walking dog. Pt states he does not remember why he fell, denies LOC. Abrasions to forehead, back of head, bridge of noseC-spine trauma, high clinical risk (NEXUS/CCR); Head trauma, minor, GCS>=13, high clinical risk, initial exam EXAM: CT HEAD WITHOUT CONTRAST CT CERVICAL SPINE WITHOUT CONTRAST TECHNIQUE: Multidetector CT imaging of the head and cervical spine was performed following the standard protocol without intravenous contrast. Multiplanar CT image reconstructions of the  cervical spine were also generated. COMPARISON:  None. FINDINGS: CT HEAD FINDINGS Brain: No intracranial hemorrhage. No parenchymal contusion. No midline shift or mass effect. Basilar cisterns are patent. No skull base fracture. No fluid in the paranasal sinuses or mastoid air cells. Orbits are normal. Mild periventricular white matter hypodensities. Vascular: No hyperdense vessel or unexpected calcification. Skull: Normal. Negative for fracture or focal lesion. Sinuses/Orbits: Paranasal sinuses and mastoid air cells are clear. Orbits are clear. Other: None. CT CERVICAL SPINE FINDINGS Alignment: Normal alignment of the cervical vertebral bodies. Skull base and vertebrae: Normal craniocervical junction. No loss of vertebral body height or disc height. Normal facet articulation. No evidence of fracture. Soft tissues and spinal canal: No prevertebral soft tissue swelling. No perispinal or epidural hematoma. Disc levels: Mild endplate spurring and joint space narrowing at C5-C7. No subluxation. Upper chest: Clear Other: None IMPRESSION: 1. No intracranial trauma. 2. Mild white matter micro vascular disease. 3. No cervical spine fracture. Electronically Signed   By: Genevive Bi M.D.   On: 10/22/2018 11:48   Dg Chest Port 1 View  Result Date: 10/22/2018 CLINICAL DATA:  Weakness, fever of 101, fall while walking dog. Patient with right sided rib pain following fall as well. Hx of ventricular tachycardia, HTN, mitral valve repair, CAD, a-fib.weakness, fever EXAM: PORTABLE CHEST 1 VIEW COMPARISON:  01/18/2018 FINDINGS: LEFT-sided pacemaker overlies stable cardiac silhouette. Cardiac silhouette is enlarged. Multiple calcified pleural plaques noted. No airspace disease. Pneumothorax. IMPRESSION: Cardiomegaly without acute findings. Extensive pleural calcifications. Electronically Signed   By: Genevive Bi M.D.   On: 10/22/2018 11:39    Pending Labs Unresulted Labs (From admission, onward)    Start     Ordered    10/22/18 1058  Urine culture  ONCE - STAT,   STAT     10/22/18 1057   10/22/18 1048  Blood culture (routine x 2)  BLOOD CULTURE X 2,   STAT     10/22/18 1048  Vitals/Pain Today's Vitals   10/22/18 1300 10/22/18 1313 10/22/18 1344 10/22/18 1348  BP: 134/90  (!) 101/94   Pulse: 85  80   Resp: 19  18   Temp:   99.2 F (37.3 C)   TempSrc:   Oral   SpO2: 99%  100%   Weight:      Height:      PainSc:  7  2  2      Isolation Precautions No active isolations  Medications Medications  sodium chloride 0.9 % bolus 500 mL (500 mLs Intravenous New Bag/Given 10/22/18 1307)  morphine 2 MG/ML injection 2 mg (2 mg Intravenous Given 10/22/18 1311)  ondansetron (ZOFRAN) injection 4 mg (4 mg Intravenous Given 10/22/18 1309)    Mobility walks with device High fall risk   Focused Assessments see assessments   R Recommendations: See Admitting Provider Note  Report given to:   Additional Notes: NA

## 2018-10-22 NOTE — ED Provider Notes (Signed)
[facility] Emergency Department Provider Note   ____________________________________________    I have reviewed the triage vital signs and the nursing notes.   HISTORY  Chief Complaint Fall     HPI Darius Miller is a 73 y.o. male with a history of atrial fibrillation, coronary artery disease, hypertension, who presents after multiple falls.  Patient reports he is feeling weak and dizzy and fell today injuring his right chest wall and hitting his head.  He states the symptoms started 1 to 2 days ago and he just has not been "feeling well ".  Denies cough or shortness of breath.  Denies fevers.  No nausea or vomiting or abdominal pain.  No recent travel.  No exposure to coronavirus patients  Past Medical History:  Diagnosis Date  . Atrial fibrillation ([place])   . Coronary artery disease  s/p RCA stents   . History of mitral valve repair   . Hypertension   . 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 Kidney stones   . Stroke ([place])   . Ventricular tachycardia [facility])     Patient Active Problem List   Diagnosis Date Noted  . Benign prostatic hyperplasia with lower urinary tract symptoms 10/13/2018  . Nephrolithiasis 10/13/2018  . Chronic systolic heart failure ([place])   . V-tach ([place]) 01/18/2018  . Ventricular tachycardia ([place]) 06/03/2017  . VT (ventricular tachycardia) ([place]) 06/03/2017    Past Surgical History:  Procedure Laterality Date  . CARDIAC CATHETERIZATION    . CORONARY ANGIOPLASTY    . Coronary stent 2009  2009  . ICD IMPLANT    . MITRAL VALVE REPAIR  October 16, 2014    Prior to Admission medications   Medication Sig Start Date End Date Taking? Authorizing Provider  acetaminophen (TYLENOL) 500 MG tablet Take 1 tablet by mouth every 8 (eight) hours as needed.   Yes [provider]  amiodarone (PACERONE) 400 MG tablet Take 1 tablet (400 mg total) by mouth 2 (two) times daily.  01/22/18  Yes Bettey Costa, MD  aspirin 81 MG tablet Take 1 tablet (81 mg total) by mouth daily. 06/04/17  Yes Tukov-Yual, Magdalene S, NP  atorvastatin (LIPITOR) 20 MG tablet Take 20 mg by mouth daily. 12/03/17  Yes [provider]  dabigatran (PRADAXA) 150 MG CAPS capsule Take 1 capsule (150 mg total) by mouth 2 (two) times daily. 06/04/17  Yes Tukov-Yual, Arlyss Gandy, NP  furosemide (LASIX) 40 MG tablet Take 1 tablet (40 mg total) by mouth daily. 06/04/17  Yes Tukov-Yual, Magdalene S, NP  magnesium oxide (MAG-OX) 400 MG tablet Take 1 tablet by mouth 2 (two) times daily. 11/08/17  Yes [provider]  metoprolol succinate (TOPROL-XL) 25 MG 24 hr tablet Take 0.5 tablets (12.5 mg total) by mouth 2 (two) times daily. 01/22/18  Yes Mody, Ulice Bold, MD  mexiletine (MEXITIL) 200 MG capsule Take 1 capsule (200 mg total) by mouth every 12 (twelve) hours. 01/22/18  Yes Mody, Ulice Bold, MD  nitroGLYCERIN (NITROSTAT) 0.4 MG SL tablet Place 1 tablet (0.4 mg total) under the tongue every 5 (five) minutes as needed for chest pain. 06/04/17  Yes Tukov-Yual, Magdalene S, NP  TRELEGY ELLIPTA 100-62.5-25 MCG/INH AEPB INHALE 1 PUFF INTO THE LUNGS ONCE DAILY 09/01/18  Yes [provider]  venlafaxine XR (EFFEXOR-XR) 75 MG 24 hr capsule Take 75 mg by mouth daily.  09/23/18  Yes [provider]     Allergies Flomax [tamsulosin hcl]  History reviewed. No pertinent family history.  Social History Social History   Tobacco Use  . Smoking status: Former Smoker    Packs/day: 2.00    Years: 20.00    Pack years: 40.00    Types: Cigarettes  . Smokeless tobacco: Never Used  Substance Use Topics  . Alcohol use: Not on file  . Drug use: Not on file    Review of Systems  Constitutional: No fever/chills Eyes: No visual changes.  ENT: No neck pain Cardiovascular: Right-sided chest wall pain Respiratory: Denies shortness of breath. Gastrointestinal: No abdominal pain.   Genitourinary: Negative  for dysuria. Musculoskeletal: Negative for back pain. Skin: Negative for rash. Neurological: Negative for weakness   ____________________________________________   PHYSICAL EXAM:  VITAL SIGNS: ED Triage Vitals  Enc Vitals Group     BP 10/22/18 1026 (!) 155/98     Pulse Rate 10/22/18 1026 85     Resp 10/22/18 1026 14     Temp 10/22/18 1026 (!) 101 F (38.3 C)     Temp Source 10/22/18 1026 Oral     SpO2 10/22/18 1026 94 %     Weight 10/22/18 1027 76.7 kg (169 lb)     Height 10/22/18 1027 1.803 m (5\' 11" )     Head Circumference --      Peak Flow --      Pain Score 10/22/18 1027 7     Pain Loc --      Pain Edu? --      Excl. in GC? --     Constitutional: Alert and oriented.  Eyes: Conjunctivae are normal.  Head: Mild hematoma posterior scalp Nose: No congestion/rhinnorhea. Mouth/Throat: Mucous membranes are moist.   Neck:  , no vertebral tenderness palpation, some discomfort with range of motion Cardiovascular: Normal rate. Grossly normal heart sounds.  Good peripheral circulation.  Chest wall tenderness to the right lateral chest Respiratory: Normal respiratory effort.  No retractions. Lungs CTAB. Gastrointestinal: Soft and nontender. No distention.  No CVA tenderness.  Musculoskeletal: Full range of motion of all extremities, warm and well perfused Neurologic:  Normal speech and language. No gross focal neurologic deficits are appreciated.  Skin:  Skin is warm, dry  Psychiatric: Mood and affect are normal. Speech and behavior are normal.  ____________________________________________   LABS (all labs ordered are listed, but only abnormal results are displayed)  Labs Reviewed  COMPREHENSIVE METABOLIC PANEL - Abnormal; Notable for the following components:      Result Value   Glucose, Bld 128 (*)    AST 44 (*)    All other components within normal limits  URINALYSIS, COMPLETE (UACMP) WITH MICROSCOPIC - Abnormal; Notable for the following components:   Color, Urine  YELLOW (*)    APPearance HAZY (*)    Hgb urine dipstick MODERATE (*)    Protein, ur 30 (*)    Leukocytes,Ua TRACE (*)    Bacteria, UA RARE (*)    All other components within normal limits  SARS CORONAVIRUS 2 (HOSPITAL ORDER, PERFORMED IN [facility])  CULTURE, BLOOD (ROUTINE X 2)  CULTURE, BLOOD (ROUTINE X 2)  URINE CULTURE  CBC WITH DIFFERENTIAL/PLATELET  LACTIC ACID, PLASMA  TROPONIN I (HIGH SENSITIVITY)   ____________________________________________  EKG  ED ECG REPORT I, Jene Everyobert Idamay Hosein, the attending physician, personally viewed and interpreted this ECG.  Date: 10/22/2018  Rhythm: Ventricular paced QRS Axis: Abnormal Intervals: normal ST/T Wave abnormalities: Nonspecific changes Narrative Interpretation: no evidence of acute ischemia  ____________________________________________  RADIOLOGY  CT head cervical spine, no acute distress Chest x-ray unremarkable, no pneumonia Rib x-rays demonstrate right second and third rib fracture ____________________________________________   PROCEDURES  Procedure(s) performed: No  Procedures   Critical Care performed: No ____________________________________________   INITIAL IMPRESSION / ASSESSMENT AND PLAN / ED COURSE  Pertinent labs & imaging results that were available during my care of the patient were reviewed by me and considered in my medical decision making (see chart for details).  Patient presents after fall secondary to feeling dizzy, weak and feeling ill the last 1 to 2 days.  Notable fever here initially however urinalysis reassuring, chest x-ray negative for pneumonia and coronavirus swab is negative.  Remains suspicious of early pneumonia, cultures sent, lactic normal.  Rib fractures on chest x-ray we will treat with morphine for pain.  Will admit to the hospitalist service for further evaluation    ____________________________________________   FINAL CLINICAL IMPRESSION(S) / ED DIAGNOSES   Final diagnoses:  Fall, initial encounter  Injury of head, initial encounter  Dizziness  Closed fracture of multiple ribs of right side, initial encounter        Note:  This document was prepared using Dragon voice recognition software and may include unintentional dictation errors.   Jene Every, MD 10/22/18 1504

## 2018-10-22 NOTE — H&P (Addendum)
[facility] NAME: Darius Miller    MR#:  [id number]  DATE OF BIRTH:  [birth year]  DATE OF ADMISSION:  10/22/2018  PRIMARY CARE PHYSICIAN: Baxter Hire, MD   REQUESTING/REFERRING PHYSICIAN: Lavonia Drafts, MD  CHIEF COMPLAINT:   Chief Complaint  Patient presents with   Fall    HISTORY OF PRESENT ILLNESS:  Darius Miller  is a 73 y.o. male with a known history of paroxysmal atrial fibrillation, CAD s/p stents, hypertension, hx stroke, history of v-tach with ICD in place who presented to the ED with a fall this morning.  Patient states he was walking his dog, when his dog suddenly pulled on the leash and caused him to feel like he was going to fall backwards.  Patient states that he thinks he fell on his knees, then tried to get back up, and then fell on his right side.  He endorses some right chest wall pain.  He denies any headaches, visual changes, weakness/numbness of his extremities.  He denies any chest pain or shortness of breath.  He denies a cough, fevers, chills.  He did not have any loss of consciousness.  He does think that he hit his head when he fell.  In the ED, he had a fever to 101F. Vitals and labs were otherwise unremarkable. CT head and CT c-spine were negative. CXR showed right second and third rib fractures. Hospitalists were called for admission.  PAST MEDICAL HISTORY:   Past Medical History:  Diagnosis Date   Atrial fibrillation ([place])    Coronary artery disease  s/p RCA stents    History of mitral valve repair    Hypertension    ICD (implantable cardioverter-defibrillator), biventricular, [employer] NO  atrial lead    Kidney stones    Stroke [facility])    Ventricular tachycardia ([place])     PAST SURGICAL HISTORY:   Past Surgical History:  Procedure Laterality Date   CARDIAC CATHETERIZATION     CORONARY ANGIOPLASTY     Coronary stent 2009  2009   ICD IMPLANT     MITRAL VALVE REPAIR  October 16, 2014    SOCIAL HISTORY:   Social History   Tobacco Use   Smoking status: Former Smoker    Packs/day: 2.00    Years: 20.00    Pack years: 40.00    Types: Cigarettes   Smokeless tobacco: Never Used  Substance Use Topics   Alcohol use: Not on file    FAMILY HISTORY:  Father-heart disease  DRUG ALLERGIES:   Allergies  Allergen Reactions   Flomax [Tamsulosin Hcl]     REVIEW OF SYSTEMS:   Review of Systems  Constitutional: Negative for chills and fever.  HENT: Negative for congestion and sore throat.   Eyes: Negative for blurred vision and double vision.  Respiratory: Negative for cough and shortness of breath.   Cardiovascular: Negative for chest pain and palpitations.  Gastrointestinal: Negative for nausea and vomiting.  Genitourinary: Negative for dysuria and urgency.  Musculoskeletal: Positive for falls. Negative for back pain and neck pain.  Neurological: Negative for dizziness, sensory change, weakness and headaches.  Psychiatric/Behavioral: Negative for depression. The patient is not nervous/anxious.     MEDICATIONS AT HOME:   Prior to Admission medications   Medication Sig Start Date End Date Taking? Authorizing Provider  acetaminophen (TYLENOL) 500 MG tablet Take 1 tablet by mouth every 8 (eight) hours as needed.   Yes [provider]  amiodarone (PACERONE) 400 MG tablet Take 1 tablet (400 mg total) by mouth 2 (two) times daily. 01/22/18  Yes Adrian SaranMody, Sital, MD  aspirin 81 MG tablet Take 1 tablet (81 mg total) by mouth daily. 06/04/17  Yes Tukov-Yual, Magdalene S, NP  atorvastatin (LIPITOR) 20 MG tablet Take 20 mg by mouth daily. 12/03/17  Yes [provider]  dabigatran (PRADAXA) 150 MG CAPS capsule Take 1 capsule (150 mg total) by mouth 2 (two) times daily. 06/04/17  Yes Tukov-Yual, Alroy BailiffMagdalene S, NP  furosemide (LASIX) 40 MG tablet Take 1 tablet (40 mg total) by mouth daily. 06/04/17  Yes Tukov-Yual, Magdalene S, NP  magnesium oxide (MAG-OX) 400  MG tablet Take 1 tablet by mouth 2 (two) times daily. 11/08/17  Yes [provider]  metoprolol succinate (TOPROL-XL) 25 MG 24 hr tablet Take 0.5 tablets (12.5 mg total) by mouth 2 (two) times daily. 01/22/18  Yes Mody, Patricia PesaSital, MD  mexiletine (MEXITIL) 200 MG capsule Take 1 capsule (200 mg total) by mouth every 12 (twelve) hours. 01/22/18  Yes Mody, Patricia PesaSital, MD  nitroGLYCERIN (NITROSTAT) 0.4 MG SL tablet Place 1 tablet (0.4 mg total) under the tongue every 5 (five) minutes as needed for chest pain. 06/04/17  Yes Tukov-Yual, Magdalene S, NP  TRELEGY ELLIPTA 100-62.5-25 MCG/INH AEPB INHALE 1 PUFF INTO THE LUNGS ONCE DAILY 09/01/18  Yes [provider]  venlafaxine XR (EFFEXOR-XR) 75 MG 24 hr capsule Take 75 mg by mouth daily.  09/23/18  Yes [provider]      VITAL SIGNS:  Blood pressure (!) 137/95, pulse 77, temperature 99.2 F (37.3 C), temperature source Oral, resp. rate 18, height 5\' 11"  (1.803 m), weight 76.7 kg, SpO2 100 %.  PHYSICAL EXAMINATION:  Physical Exam  GENERAL:  73 y.o.-year-old patient lying in the bed with no acute distress.  EYES: Pupils equal, round, reactive to light and accommodation. No scleral icterus. Extraocular muscles intact.  HEENT: Head normocephalic. Oropharynx and nasopharynx clear. + Ecchymoses present on the right side of the forehead. NECK:  Supple, no jugular venous distention. No thyroid enlargement, no tenderness.  LUNGS: Normal breath sounds bilaterally, no wheezing, rales,rhonchi or crepitation. No use of accessory muscles of respiration. + Taking shallow breaths. CARDIOVASCULAR: RRR, S1, S2 normal. No murmurs, rubs, or gallops. + Right chest wall is tender to palpation. ABDOMEN: Soft, nontender, nondistended. Bowel sounds present. No organomegaly or mass.  EXTREMITIES: No pedal edema, cyanosis, or clubbing.  NEUROLOGIC: Cranial nerves II through XII are intact. Muscle strength 5/5 in all extremities. Sensation intact. Gait not  checked.  PSYCHIATRIC: The patient is alert and oriented x 3.  SKIN: No obvious rash, lesion, or ulcer. + Ecchymoses of the right forehead.  LABORATORY PANEL:   CBC Recent Labs  Lab 10/22/18 1040  WBC 6.0  HGB 14.5  HCT 45.7  PLT 180   ------------------------------------------------------------------------------------------------------------------  Chemistries  Recent Labs  Lab 10/22/18 1040  NA 140  K 3.7  CL 101  CO2 30  GLUCOSE 128*  BUN 14  CREATININE 1.13  CALCIUM 8.9  AST 44*  ALT 37  ALKPHOS 117  BILITOT 0.9   ------------------------------------------------------------------------------------------------------------------  Cardiac Enzymes No results for input(s): TROPONINI in the last 168 hours. ------------------------------------------------------------------------------------------------------------------  RADIOLOGY:  Dg Ribs Unilateral W/chest Right  Result Date: 10/22/2018 CLINICAL DATA:  73 year old male with right lateral rib pain after falling EXAM: RIGHT RIBS AND CHEST - 3+ VIEW COMPARISON:  Most recent prior chest x-ray 01/18/2018 FINDINGS: Acute nondisplaced fracture of the anterolateral  aspect of the right second rib. Slight angulation of the anterolateral aspect of the right third in lieu rib is also evident and favored to represent an additional nondisplaced fracture. Left subclavian approach biventricular cardiac rhythm maintenance device. Leads project over the right ventricle and overlying the left heart. Stable cardiomegaly. Atherosclerotic calcifications in the aorta. 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 No pneumothorax or pleural effusion. Stable appearance of the lungs. IMPRESSION: 1. Acute nondisplaced fracture of the anterolateral aspect of the right second rib. 2. Also suspect a nondisplaced fracture of the anterolateral aspect of the right third rib. 3. No evidence of underlying pneumothorax or  pleural effusion. 4. Otherwise, stable chest x-ray with cardiomegaly, pulmonary vascular congestion and bilateral calcified pleural plaques. Electronically Signed   By: Malachy MoanHeath  McCullough M.D.   On: 10/22/2018 13:44   Ct Head Wo Contrast  Result Date: 10/22/2018 CLINICAL DATA:  Pt fell while walking dog. Pt states he does not remember why he fell, denies LOC. Abrasions to forehead, back of head, bridge of noseC-spine trauma, high clinical risk (NEXUS/CCR); Head trauma, minor, GCS>=13, high clinical risk, initial exam EXAM: CT HEAD WITHOUT CONTRAST CT CERVICAL SPINE WITHOUT CONTRAST TECHNIQUE: Multidetector CT imaging of the head and cervical spine was performed following the standard protocol without intravenous contrast. Multiplanar CT image reconstructions of the cervical spine were also generated. COMPARISON:  None. FINDINGS: CT HEAD FINDINGS Brain: No intracranial hemorrhage. No parenchymal contusion. No midline shift or mass effect. Basilar cisterns are patent. No skull base fracture. No fluid in the paranasal sinuses or mastoid air cells. Orbits are normal. Mild periventricular white matter hypodensities. Vascular: No hyperdense vessel or unexpected calcification. Skull: Normal. Negative for fracture or focal lesion. Sinuses/Orbits: Paranasal sinuses and mastoid air cells are clear. Orbits are clear. Other: None. CT CERVICAL SPINE FINDINGS Alignment: Normal alignment of the cervical vertebral bodies. Skull base and vertebrae: Normal craniocervical junction. No loss of vertebral body height or disc height. Normal facet articulation. No evidence of fracture. Soft tissues and spinal canal: No prevertebral soft tissue swelling. No perispinal or epidural hematoma. Disc levels: Mild endplate spurring and joint space narrowing at C5-C7. No subluxation. Upper chest: Clear Other: None IMPRESSION: 1. No intracranial trauma. 2. Mild white matter micro vascular disease. 3. No cervical spine fracture. Electronically  Signed   By: Genevive BiStewart  Edmunds M.D.   On: 10/22/2018 11:48   Ct Cervical Spine Wo Contrast  Result Date: 10/22/2018 CLINICAL DATA:  Pt fell while walking dog. Pt states he does not remember why he fell, denies LOC. Abrasions to forehead, back of head, bridge of noseC-spine trauma, high clinical risk (NEXUS/CCR); Head trauma, minor, GCS>=13, high clinical risk, initial exam EXAM: CT HEAD WITHOUT CONTRAST CT CERVICAL SPINE WITHOUT CONTRAST TECHNIQUE: Multidetector CT imaging of the head and cervical spine was performed following the standard protocol without intravenous contrast. Multiplanar CT image reconstructions of the cervical spine were also generated. COMPARISON:  None. FINDINGS: CT HEAD FINDINGS Brain: No intracranial hemorrhage. No parenchymal contusion. No midline shift or mass effect. Basilar cisterns are patent. No skull base fracture. No fluid in the paranasal sinuses or mastoid air cells. Orbits are normal. Mild periventricular white matter hypodensities. Vascular: No hyperdense vessel or unexpected calcification. Skull: Normal. Negative for fracture or focal lesion. Sinuses/Orbits: Paranasal sinuses and mastoid air cells are clear. Orbits are clear. Other: None. CT CERVICAL SPINE FINDINGS Alignment: Normal alignment of the cervical vertebral bodies. Skull base and vertebrae: Normal craniocervical junction. No loss of  vertebral body height or disc height. Normal facet articulation. No evidence of fracture. Soft tissues and spinal canal: No prevertebral soft tissue swelling. No perispinal or epidural hematoma. Disc levels: Mild endplate spurring and joint space narrowing at C5-C7. No subluxation. Upper chest: Clear Other: None IMPRESSION: 1. No intracranial trauma. 2. Mild white matter micro vascular disease. 3. No cervical spine fracture. Electronically Signed   By: Genevive Bi M.D.   On: 10/22/2018 11:48   Dg Chest Port 1 View  Result Date: 10/22/2018 CLINICAL DATA:  Weakness, fever of 101,  fall while walking dog. Patient with right sided rib pain following fall as well. Hx of ventricular tachycardia, HTN, mitral valve repair, CAD, a-fib.weakness, fever EXAM: PORTABLE CHEST 1 VIEW COMPARISON:  01/18/2018 FINDINGS: LEFT-sided pacemaker overlies stable cardiac silhouette. Cardiac silhouette is enlarged. Multiple calcified pleural plaques noted. No airspace disease. Pneumothorax. IMPRESSION: Cardiomegaly without acute findings. Extensive pleural calcifications. Electronically Signed   By: Genevive Bi M.D.   On: 10/22/2018 11:39      IMPRESSION AND PLAN:   Fever- unclear etiology.  Not meeting sepsis criteria on admission.  May be related to patient's rib fractures.  Chest x-ray without any obvious pneumonia. UA unremarkable. -COVID testing is negative -Check procalcitonin and repeat CXR in the morning -Will give a dose of ceftriaxone and azithromycin, can discontinue if no continued signs of infection -Blood and urine cultures pending  Rib fractures s/p mechanical fall-x-ray with fractured second and third rib on the right.  CT head and C-spine negative. -Pain control -Incentive spirometry -PT consult  CAD s/p stents-stable, no active chest pain -Continue home aspirin, lipitor, metoprolol  Paroxysmal atrial fibrillation/history of sustained V. tach with ICD in place -Continue home amiodarone, metoprolol, mexiletine, pradaxa  Chronic diastolic CHF- stable, no signs of volume overload. Last ECHO with EF 50-55%. -Continue home lasix and metoprolol  COPD- stable, no signs of acute exacerbation -Continue home trelegy  Hypertension- BP normal -Continue home metoprolol  DVT prophylaxis- on home pradaxa  All the records are reviewed and case discussed with ED provider. Management plans discussed with the patient, family and they are in agreement.  CODE STATUS: Partial- DNI  TOTAL TIME TAKING CARE OF THIS PATIENT: 45 minutes.    Darius Miller M.D on 10/22/2018 at 2:57  PM  Between 7am to 6pm - Pager [phone]  After 6pm go to [email or web address] - Social research officer, government  [employer] [facility]  Office  [phone]  CC: Primary care physician; Gracelyn Nurse, MD   Note: This dictation was prepared with Dragon dictation along with smaller phrase technology. Any transcriptional errors that result from this process are unintentional.

## 2018-10-22 NOTE — Progress Notes (Signed)
Family Meeting Note  Advance Directive:yes  Today a meeting took place with the Patient and spouse.  Patient is able to participate.  The following clinical team members were present during this meeting:MD  The following were discussed:Patient's diagnosis: fever, rib fractures, Patient's progosis: Unable to determine and Goals for treatment: DNI  Additional follow-up to be provided: prn  Time spent during discussion:20 minutes  Evette Doffing, MD

## 2018-10-22 NOTE — ED Notes (Signed)
Patient transported to CT 

## 2018-10-22 NOTE — ED Notes (Signed)
Patient transported to X-ray 

## 2018-10-23 ENCOUNTER — Observation Stay: Payer: Medicare HMO

## 2018-10-23 LAB — URINE CULTURE: Culture: <10000 — AB

## 2018-10-23 LAB — BASIC METABOLIC PANEL
Anion gap: 6 (ref 5–15)
BUN: 13 mg/dL (ref 8–23)
CO2: 29 mmol/L (ref 22–32)
Calcium: 8.6 mg/dL — ABNORMAL LOW (ref 8.9–10.3)
Chloride: 102 mmol/L (ref 98–111)
Creatinine, Ser: 0.96 mg/dL (ref 0.61–1.24)
GFR calc Af Amer: >60 mL/min (ref >60)
GFR calc non Af Amer: >60 mL/min (ref >60)
Glucose, Bld: 124 mg/dL — ABNORMAL HIGH (ref 70–99)
Potassium: 3.5 mmol/L (ref 3.5–5.1)
Sodium: 137 mmol/L (ref 135–145)

## 2018-10-23 LAB — CBC
HCT: 38.6 % — ABNORMAL LOW (ref 39.0–52.0)
Hemoglobin: 12.3 g/dL — ABNORMAL LOW (ref 13.0–17.0)
MCH: 30.3 pg (ref 26.0–34.0)
MCHC: 31.9 g/dL (ref 30.0–36.0)
MCV: 95.1 fL (ref 80.0–100.0)
Platelets: 166 10*3/uL (ref 150–400)
RBC: 4.06 MIL/uL — ABNORMAL LOW (ref 4.22–5.81)
RDW: 14.6 % (ref 11.5–15.5)
WBC: 9 10*3/uL (ref 4.0–10.5)
nRBC: 0 % (ref 0.0–0.2)

## 2018-10-23 MED ORDER — OXYCODONE HCL 5 MG PO TABS: 5.0000 mg | ORAL_TABLET | ORAL | 0 refills | Status: DC | PRN

## 2018-10-23 NOTE — TOC Initial Note (Signed)
Transition of Care [facility]) - Initial/Assessment Note    Patient Details  Name: Darius Miller MRN: [id number] Date of Birth: May 03, 1945  [facility]) CM/SW Contact:    Elliot Gurney [place], [place] Phone Number: 10/23/2018, 12:50 PM  Clinical Narrative:                 Patient is a 73 year old male admitted due to a fall while walking his  dog as well as fever. Imaging revealed R 2nd/3rd acute rib fractures. Patient's medical history includes Afib, CAD, and HTN. PT recommending SNF.    This Education officer, museum met with patient and patient's spouse to discuss discharge plan and SNF referral process.  Patient and spouse both agree with skilled nursing facility post discharge from the hospital. Both patient and spouse have no specific facility preference.  This socia l worker will complete FL2 and send out referrals.   Expected Discharge Plan: Skilled Nursing Facility Barriers to Discharge: No Barriers Identified   Patient Goals and CMS Choice Patient states their goals for this hospitalization and ongoing recovery are:: "My wife cannot handle me at home right now"      Expected Discharge Plan and Services Expected Discharge Plan: [place] In-house Referral: Clinical Social Work Discharge Planning Services: Other - See comment(skilled nursing facility) Post Acute Care Choice: Protection arrangements for the past 2 months: Single Family Home Expected Discharge Date: 10/23/18                                    Prior Living Arrangements/Services Living arrangements for the past 2 months: Single Family Home Lives with:: Spouse Patient language and need for interpreter reviewed:: No Do you feel safe going back to the place where you live?: Yes      Need for Family Participation in Patient Care: Yes (Comment) Care giver support system in place?: Yes (comment)   Criminal Activity/Legal Involvement Pertinent to Current  Situation/Hospitalization: No - Comment as needed  Activities of Daily Living Home Assistive Devices/Equipment: None ADL Screening (condition at time of admission) Patient's cognitive ability adequate to safely complete daily activities?: Yes Is the patient deaf or have difficulty hearing?: No Does the patient have difficulty seeing, even when wearing glasses/contacts?: No Does the patient have difficulty concentrating, remembering, or making decisions?: No Patient able to express need for assistance with ADLs?: Yes Does the patient have difficulty dressing or bathing?: Yes Independently performs ADLs?: No Communication: Independent Dressing (OT): Needs assistance Is this a change from baseline?: Change from baseline, expected to last <3days Grooming: Independent Feeding: Independent Bathing: Needs assistance Is this a change from baseline?: Change from baseline, expected to last <3 days Toileting: Needs assistance Is this a change from baseline?: Change from baseline, expected to last <3 days In/Out Bed: Needs assistance Is this a change from baseline?: Change from baseline, expected to last <3 days Walks in Home: Independent Does the patient have difficulty walking or climbing stairs?: Yes Weakness of Legs: Both Weakness of Arms/Hands: None  Permission Sought/Granted Permission sought to share information with : Family Supports Permission granted to share information with : Yes, Verbal Permission Granted        Permission granted to share info w Relationship: khalifa knecht  Permission granted to share info w Contact Information: [phone]  Emotional Assessment Appearance:: Appears older than stated age Attitude/Demeanor/Rapport: Engaged Affect (typically observed): Adaptable Orientation: :  Oriented to Self, Oriented to Place, Oriented to  Time, Oriented to Situation Alcohol / Substance Use: Not Applicable Psych Involvement: No (comment)  Admission diagnosis:   Dizziness [R42] Injury of head, initial encounter [S09.90XA] Fall, initial encounter [W19.XXXA] Closed fracture of multiple ribs of right side, initial encounter [S22.41XA] Patient Active Problem List   Diagnosis Date Noted  . Fever 10/22/2018  . Benign prostatic hyperplasia with lower urinary tract symptoms 10/13/2018  . Nephrolithiasis 10/13/2018  . Chronic systolic heart failure ([place])   . V-tach ([place]) 01/18/2018  . Ventricular tachycardia ([place]) 06/03/2017  . VT (ventricular tachycardia) ([place]) 06/03/2017   PCP:  Baxter Hire, MD Pharmacy:   [id number] [id number]: [phone]: [phone]    Social Determinants of Health (SDOH) Interventions    Readmission Risk Interventions No flowsheet data found.

## 2018-10-23 NOTE — [place] FL2 (Signed)
[place] MEDICAID FL2 LEVEL OF CARE SCREENING TOOL     IDENTIFICATION  Patient Name: Darius Miller Birthdate: May 26, 1945 Sex: male Admission Date (Current Location): 10/22/2018  [place] and [place] Number:  Chiropodist and Address:  [facility], [address], [place], [place] [address]      Provider Number:    Attending Physician Name and Address:  Adrian Saran, MD  Relative Name and Phone Number:  Danie Magnone [phone]    Current Level of Care: Hospital Recommended Level of Care: Skilled Nursing Facility Prior Approval Number:    Date Approved/Denied:   PASRR Number: [id number]  Discharge Plan: SNF    Current Diagnoses: Patient Active Problem List   Diagnosis Date Noted  . Fever 10/22/2018  . Benign prostatic hyperplasia with lower urinary tract symptoms 10/13/2018  . Nephrolithiasis 10/13/2018  . Chronic systolic heart failure (HCC)   . V-tach (HCC) 01/18/2018  . Ventricular tachycardia (HCC) 06/03/2017  . VT (ventricular tachycardia) (HCC) 06/03/2017    Orientation RESPIRATION BLADDER Height & Weight     Self, Time, Situation, Place  Normal Continent Weight: 169 lb (76.7 kg) Height:  5\' 11"  (180.3 cm)  BEHAVIORAL SYMPTOMS/MOOD NEUROLOGICAL BOWEL NUTRITION STATUS      Continent Diet  AMBULATORY STATUS COMMUNICATION OF NEEDS Skin   Extensive Assist Verbally Normal                       Personal Care Assistance Level of Assistance  Bathing, Feeding, Dressing Bathing Assistance: Limited assistance Feeding assistance: Independent Dressing Assistance: Limited assistance     Functional Limitations Info  Sight, Hearing, Speech Sight Info: Adequate Hearing Info: Adequate Speech Info: Adequate    SPECIAL CARE FACTORS FREQUENCY  PT (By licensed PT), OT (By licensed OT)     PT Frequency: 5x per week OT Frequency: 5x per week            Contractures Contractures Info: Not present    Additional  Factors Info                  Current Medications (10/23/2018):  This is the current hospital active medication list Current Facility-Administered Medications  Medication Dose Route Frequency Provider Last Rate Last Dose  . acetaminophen (TYLENOL) tablet 650 mg  650 mg Oral Q6H PRN Campbell Stall, MD   650 mg at 10/22/18 2100   Or  . acetaminophen (TYLENOL) suppository 650 mg  650 mg Rectal Q6H PRN Mayo, Allyn Kenner, MD      . amiodarone (PACERONE) tablet 400 mg  400 mg Oral BID Campbell Stall, MD   400 mg at 10/23/18 0100  . aspirin EC tablet 81 mg  81 mg Oral Daily Albina Billet, [place]   81 mg at 10/23/18 0851  . atorvastatin (LIPITOR) tablet 20 mg  20 mg Oral Daily Mayo, Allyn Kenner, MD   20 mg at 10/23/18 1146  . azithromycin (ZITHROMAX) 500 mg in sodium chloride 0.9 % 250 mL IVPB  500 mg Intravenous Q24H Campbell Stall, MD 250 mL/hr at 10/22/18 1856 500 mg at 10/22/18 1856  . bacitracin ointment   Topical Daily Mayo, Allyn Kenner, MD   1 application at 10/23/18 [phone]  . cefTRIAXone (ROCEPHIN) 1 g in sodium chloride 0.9 % 100 mL IVPB  1 g Intravenous Q24H Mayo, Allyn Kenner, MD 200 mL/hr at 10/22/18 1751 1 g at 10/22/18 1751  . dabigatran (PRADAXA) capsule 150 mg  150  mg Oral BID Sela Hua, MD   150 mg at 10/23/18 0857  . fluticasone furoate-vilanterol (BREO ELLIPTA) 100-25 MCG/INH 1 puff  1 puff Inhalation Daily Shanlever, Pierce Crane, RPH      . furosemide (LASIX) tablet 40 mg  40 mg Oral Daily Mayo, Pete Pelt, MD   40 mg at 10/23/18 0851  . metoprolol succinate (TOPROL-XL) 24 hr tablet 12.5 mg  12.5 mg Oral BID Sela Hua, MD   12.5 mg at 10/23/18 0852  . mexiletine (MEXITIL) capsule 200 mg  200 mg Oral Q12H Mayo, Pete Pelt, MD   200 mg at 10/23/18 0857  . morphine 2 MG/ML injection 2 mg  2 mg Intravenous Q3H PRN Mayo, Pete Pelt, MD   2 mg at 10/22/18 1855  . ondansetron (ZOFRAN) tablet 4 mg  4 mg Oral Q6H PRN Mayo, Pete Pelt, MD       Or  . ondansetron [facility]) injection 4 mg   4 mg Intravenous Q6H PRN Mayo, Pete Pelt, MD      . oxyCODONE (Oxy IR/ROXICODONE) immediate release tablet 5 mg  5 mg Oral Q4H PRN Mayo, Pete Pelt, MD   5 mg at 10/23/18 1313  . polyethylene glycol (MIRALAX / GLYCOLAX) packet 17 g  17 g Oral Daily PRN Mayo, Pete Pelt, MD      . umeclidinium bromide (INCRUSE ELLIPTA) 62.5 MCG/INH 1 puff  1 puff Inhalation Daily Lu Duffel, [facility]      . venlafaxine XR (EFFEXOR-XR) 24 hr capsule 75 mg  75 mg Oral Daily Mayo, Pete Pelt, MD   75 mg at 10/23/18 0857     Discharge Medications:      Medication List    TAKE these medications   acetaminophen 500 MG tablet Commonly known as: TYLENOL Take 1 tablet by mouth every 8 (eight) hours as needed.   amiodarone 400 MG tablet Commonly known as: PACERONE Take 1 tablet (400 mg total) by mouth 2 (two) times daily.   aspirin 81 MG tablet Take 1 tablet (81 mg total) by mouth daily.   atorvastatin 20 MG tablet Commonly known as: LIPITOR Take 20 mg by mouth daily.   dabigatran 150 MG Caps capsule Commonly known as: PRADAXA Take 1 capsule (150 mg total) by mouth 2 (two) times daily.   furosemide 40 MG tablet Commonly known as: LASIX Take 1 tablet (40 mg total) by mouth daily.   magnesium oxide 400 MG tablet Commonly known as: MAG-OX Take 1 tablet by mouth 2 (two) times daily.   metoprolol succinate 25 MG 24 hr tablet Commonly known as: TOPROL-XL Take 0.5 tablets (12.5 mg total) by mouth 2 (two) times daily.   mexiletine 200 MG capsule Commonly known as: MEXITIL Take 1 capsule (200 mg total) by mouth every 12 (twelve) hours.   nitroGLYCERIN 0.4 MG SL tablet Commonly known as: NITROSTAT Place 1 tablet (0.4 mg total) under the tongue every 5 (five) minutes as needed for chest pain.   oxyCODONE 5 MG immediate release tablet Commonly known as: Oxy IR/ROXICODONE Take 1 tablet (5 mg total) by mouth every 4 (four) hours as needed for moderate pain.   Trelegy Ellipta  100-62.5-25 MCG/INH Aepb Generic drug: Fluticasone-Umeclidin-Vilant INHALE 1 PUFF INTO THE LUNGS ONCE DAILY   venlafaxine XR 75 MG 24 hr capsule Commonly known as: EFFEXOR-XR Take 75 mg by mouth daily.    Relevant Imaging Results:  Relevant Lab Results:   Additional Information    Cullin Dishman, [place], [place]

## 2018-10-23 NOTE — Discharge Summary (Addendum)
[place] [facility] [place] NAME: Darius Miller    MR#:  [id number]  DATE OF BIRTH:  1945-06-05  DATE OF ADMISSION:  10/22/2018 ADMITTING PHYSICIAN: Sela Hua, MD  DATE OF DISCHARGE: 11/09/2018 PRIMARY CARE PHYSICIAN: Baxter Hire, MD    ADMISSION DIAGNOSIS:  Dizziness [R42] Injury of head, initial encounter [S09.90XA] Fall, initial encounter [W19.XXXA] Closed fracture of multiple ribs of right side, initial encounter [S22.41XA]  DISCHARGE DIAGNOSIS:  Active Problems:   Fever   Rib fractures   Dizziness   Goals of care, counseling/discussion   Palliative care by specialist   Pulmonary disease   SOB (shortness of breath) Pneumonia  SECONDARY DIAGNOSIS:   Past Medical History:  Diagnosis Date  . Atrial fibrillation ([place])   . Coronary artery disease  s/p RCA stents   . History of mitral valve repair   . Hypertension   . 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 Kidney stones   . Stroke ([place])   . Ventricular tachycardia [facility])     HOSPITAL COURSE:   73 year old male with PAF and CAD who presented after mechanical fall and suffered rib fracture.  1.  Acuterespiratory failure, hypoxic: This was initially due to combination of atelectasis from rib fractures and concern for aspiration pneumonia.   He has completed course of unasyn Repeat covid 19 testing negative Wean nasal cannula as tolerated. 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 Continue Bactrim PCP prophylaxis due to steroids as per the request of pulmonary.  2.. Rib fractures: Patient is being treated conservatively. Rib fractures are status post mechanical fall. CT head and CT C-spine were negative for acute fracture. Continue incentive  spirometer.  3. PAF with history of sustained V. 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 CAD status post stents: Continue aspirin, metoprolol, and Lipitor.  5. Acute on chronic diastolic chf Patient is euvolemic.   Continue oral Lasix  6. COPD without signs of exacerbation: Continue inhalers Continue steroids for amiodarone toxicity/fibrosis/scarring   7. Essential hypertension: Patient will continue on metoprolol    COVID testing negative.  Outpatient palliative care services  DISCHARGE CONDITIONS AND DIET:   Stable for discharge heart healthy diet Dysphagia 2 diet aspiration precautions  CONSULTS OBTAINED:    DRUG ALLERGIES:   Allergies  Allergen Reactions  . Flomax [Tamsulosin Hcl]     DISCHARGE MEDICATIONS:   Allergies as of 11/09/2018      Reactions   Flomax [tamsulosin Hcl]       Medication List    STOP taking these medications   amiodarone 400 MG tablet Commonly known as: PACERONE     TAKE these medications   acetaminophen 500 MG tablet Commonly known as: TYLENOL Take 1 tablet by mouth every 8 (eight) hours as needed.   aspirin 81 MG tablet Take 1 tablet (81 mg total) by mouth daily.   atorvastatin 20 MG tablet Commonly known as: LIPITOR Take 20 mg by mouth daily.   dabigatran 150 MG Caps capsule Commonly known as: PRADAXA Take 1 capsule (150 mg total) by mouth 2 (two) times daily.   furosemide 40 MG tablet Commonly known as: LASIX Take 1 tablet (40 mg total) by mouth daily.   magnesium oxide 400  MG tablet Commonly known as: MAG-OX Take 1 tablet by mouth 2 (two) times daily.   metoprolol succinate 25 MG 24 hr tablet Commonly known as: TOPROL-XL Take 0.5 tablets (12.5 mg total) by mouth 2 (two) times daily.   mexiletine 200 MG capsule Commonly known as: MEXITIL Take 1 capsule (200 mg total) by mouth every 12  (twelve) hours.   nitroGLYCERIN 0.4 MG SL tablet Commonly known as: NITROSTAT Place 1 tablet (0.4 mg total) under the tongue every 5 (five) minutes as needed for chest pain.   oxyCODONE 5 MG immediate release tablet Commonly known as: Oxy IR/ROXICODONE Take 1 tablet (5 mg total) by mouth every 4 (four) hours as needed for moderate pain.   predniSONE 50 MG tablet Commonly known as: DELTASONE Take 1 tablet daily   sulfamethoxazole-trimethoprim 400-80 MG tablet Commonly known as: BACTRIM Take 1 tablet by mouth daily.   Trelegy Ellipta 100-62.5-25 MCG/INH Aepb Generic drug: Fluticasone-Umeclidin-Vilant INHALE 1 PUFF INTO THE LUNGS ONCE DAILY   venlafaxine XR 75 MG 24 hr capsule Commonly known as: EFFEXOR-XR Take 75 mg by mouth daily.         Today   CHIEF COMPLAINT:  Continues to have pain from rib fractures Sob improved Feeling weak   VITAL SIGNS:  Blood pressure 122/78, pulse 70, temperature 98.6 F (37 C), resp. rate 18, height 5\' 11"  (1.803 m), weight 74 kg, SpO2 98 %.   REVIEW OF SYSTEMS:  Review of Systems  Constitutional: Negative.  Negative for chills, fever and malaise/fatigue.  HENT: Negative.  Negative for ear discharge, ear pain, hearing loss, nosebleeds and sore throat.   Eyes: Negative.  Negative for blurred vision and pain.  Respiratory: Negative.  Negative for cough, hemoptysis, shortness of breath and wheezing.   Cardiovascular: Negative.  Negative for chest pain, palpitations and leg swelling.  Gastrointestinal: Negative.  Negative for abdominal pain, blood in stool, diarrhea, nausea and vomiting.  Genitourinary: Negative.  Negative for dysuria.  Musculoskeletal: Negative for back pain.       Rib pain   Skin: Negative.   Neurological: Negative for dizziness, tremors, speech change, focal weakness, seizures and headaches.  Endo/Heme/Allergies: Negative.  Does not bruise/bleed easily.  Psychiatric/Behavioral: Negative.  Negative for depression,  hallucinations and suicidal ideas.     PHYSICAL EXAMINATION:  GENERAL:  73 y.o.-year-old patient lying in the bed with no acute distress.  NECK:  Supple, no jugular venous distention. No thyroid enlargement, no tenderness.  LUNGS: Normal breath sounds bilaterally, no wheezing, rales,rhonchi  No use of accessory muscles of respiration.  CARDIOVASCULAR: S1, S2 normal. No murmurs, rubs, or gallops.  ABDOMEN: Soft, non-tender, non-distended. Bowel sounds present. No organomegaly or mass.  EXTREMITIES: No pedal edema, cyanosis, or clubbing.  PSYCHIATRIC: The patient is alert and oriented x 3.  SKIN: No obvious rash, lesion, or ulcer.   DATA REVIEW:   CBC Recent Labs  Lab 11/09/18 0557  WBC 11.1*  HGB 12.7*  HCT 40.4  PLT 237    Chemistries  Recent Labs  Lab 11/07/18 0455 11/09/18 0557  NA 141  --   K 4.3  --   CL 98  --   CO2 36*  --   GLUCOSE 97  --   BUN 37*  --   CREATININE 1.04 0.91  CALCIUM 8.2*  --   MG 2.2  --     Cardiac Enzymes No results for input(s): TROPONINI in the last 168 hours.  Microbiology Results  @MICRORSLT48 @  RADIOLOGY:  No results found.    Allergies as of 11/09/2018      Reactions   Flomax [tamsulosin Hcl]       Medication List    STOP taking these medications   amiodarone 400 MG tablet Commonly known as: PACERONE     TAKE these medications   acetaminophen 500 MG tablet Commonly known as: TYLENOL Take 1 tablet by mouth every 8 (eight) hours as needed.   aspirin 81 MG tablet Take 1 tablet (81 mg total) by mouth daily.   atorvastatin 20 MG tablet Commonly known as: LIPITOR Take 20 mg by mouth daily.   dabigatran 150 MG Caps capsule Commonly known as: PRADAXA Take 1 capsule (150 mg total) by mouth 2 (two) times daily.   furosemide 40 MG tablet Commonly known as: LASIX Take 1 tablet (40 mg total) by mouth daily.   magnesium oxide 400 MG tablet Commonly known as: MAG-OX Take 1 tablet by mouth 2 (two) times daily.    metoprolol succinate 25 MG 24 hr tablet Commonly known as: TOPROL-XL Take 0.5 tablets (12.5 mg total) by mouth 2 (two) times daily.   mexiletine 200 MG capsule Commonly known as: MEXITIL Take 1 capsule (200 mg total) by mouth every 12 (twelve) hours.   nitroGLYCERIN 0.4 MG SL tablet Commonly known as: NITROSTAT Place 1 tablet (0.4 mg total) under the tongue every 5 (five) minutes as needed for chest pain.   oxyCODONE 5 MG immediate release tablet Commonly known as: Oxy IR/ROXICODONE Take 1 tablet (5 mg total) by mouth every 4 (four) hours as needed for moderate pain.   predniSONE 50 MG tablet Commonly known as: DELTASONE Take 1 tablet daily   sulfamethoxazole-trimethoprim 400-80 MG tablet Commonly known as: BACTRIM Take 1 tablet by mouth daily.   Trelegy Ellipta 100-62.5-25 MCG/INH Aepb Generic drug: Fluticasone-Umeclidin-Vilant INHALE 1 PUFF INTO THE LUNGS ONCE DAILY   venlafaxine XR 75 MG 24 hr capsule Commonly known as: EFFEXOR-XR Take 75 mg by mouth daily.          Management plans discussed with the patient and he is in agreement. Stable for discharge   Patient should follow up with pcp  CODE STATUS:     Code Status Orders  (From admission, onward)         Start     Ordered   10/22/18 1620  Limited resuscitation (code)  Continuous    Question Answer Comment  In the event of cardiac or respiratory ARREST: Initiate Code Blue, Call Rapid Response Yes   In the event of cardiac or respiratory ARREST: Perform CPR Yes   In the event of cardiac or respiratory ARREST: Perform Intubation/Mechanical Ventilation No   In the event of cardiac or respiratory ARREST: Use NIPPV/BiPAp only if indicated Yes   In the event of cardiac or respiratory ARREST: Administer ACLS medications if indicated Yes   In the event of cardiac or respiratory ARREST: Perform Defibrillation or Cardioversion if indicated Yes      10/22/18 1620        Code Status History    Date Active  Date Inactive Code Status Order ID Comments User Context   01/19/2018 0006 01/22/2018 1715 Full Code [id number]  Cammy CopaMaier, Angela, MD Inpatient   06/03/2017 1826 06/05/2017 0123 Full Code [id number]  Salary, Evelena AsaMontell D, MD Inpatient   Advance Care Planning Activity    Advance Directive Documentation     Most Recent Value  Type of Advance Directive  Healthcare Power of Attorney  Pre-existing  out of facility DNR order (yellow form or pink MOST form)  -  "MOST" Form in Place?  -      TOTAL TIME TAKING CARE OF THIS PATIENT: 39 minutes.    Note: This dictation was prepared with Dragon dictation along with smaller phrase technology. Any transcriptional errors that result from this process are unintentional.  Adrian SaranSital Ocean Schildt M.D on 11/09/2018 at 10:55 AM  Between 7am to 6pm - Pager - [phone] After 6pm go to [email or web address] - password [email or web address]  [facility] [place] [facility]  Office  [phone]  CC: Primary care physician; Gracelyn NurseJohnston, John D, MD

## 2018-10-23 NOTE — Evaluation (Signed)
Physical Therapy Evaluation Patient Details Name: Darius Miller MRN: [id number] DOB: 12-13-1945 Today's Date: 10/23/2018   History of Present Illness  Pt admitted for fall while walking dog as well as fever. Imaging revealed R 2nd/3rd acute rib fractures. History includes Afib, CAD, and HTN.  Clinical Impression  Pt is a pleasant 73 year old male who was admitted for fall and fever. Pt performs bed mobility with mod assist, able to tolerate limited bed mobility and sitting tolerance. Unable to further perform OOB mobility due to severe pain. All activity performed on RA, sats at 88%. Pt demonstrates deficits with endurance/pain/mobility/balance. Pt is currently not at baseline level. Would benefit from skilled PT to address above deficits and promote optimal return to PLOF; recommend transition to STR upon discharge from acute hospitalization.     Follow Up Recommendations SNF    Equipment Recommendations  Rolling walker with 5" wheels    Recommendations for Other Services       Precautions / Restrictions Precautions Precautions: Fall Restrictions Weight Bearing Restrictions: No      Mobility  Bed Mobility Overal bed mobility: Needs Assistance Bed Mobility: Supine to Sit     Supine to sit: Mod assist     General bed mobility comments: able to initiate movement towards EOB, instructed to log roll. Needs significant trunkal assistance for upright posture and heavily guards R side. Once seated at EOB, O2 sats at 88% on RA and pt unable to tolerate further transfers. Returned supine with max assist for repositioning.  Transfers                 General transfer comment: unable to tolerate standing/ambulation due to pain  Ambulation/Gait                Stairs            Wheelchair Mobility    Modified Rankin (Stroke Patients Only)       Balance Overall balance assessment: History of Falls;Needs assistance Sitting-balance support: Feet  supported;Bilateral upper extremity supported Sitting balance-Leahy Scale: Poor Sitting balance - Comments: L lateral leaning                                     Pertinent Vitals/Pain Pain Assessment: Faces Faces Pain Scale: Hurts whole lot Pain Location: back, R side, head Pain Descriptors / Indicators: Aching;Discomfort;Dull;Grimacing;Guarding Pain Intervention(s): Limited activity within patient's tolerance    Home Living Family/patient expects to be discharged to:: Private residence Living Arrangements: Spouse/significant other Available Help at Discharge: Family;Available 24 hours/day Type of Home: House Home Access: Level entry     Home Layout: One level Home Equipment: Cane - single point;Walker - 2 wheels      Prior Function Level of Independence: Independent         Comments: Community ambulator, walks his dogs regularly. Reports multiple stumbles while dog walking     Hand Dominance        Extremity/Trunk Assessment   Upper Extremity Assessment Upper Extremity Assessment: Overall WFL for tasks assessed    Lower Extremity Assessment Lower Extremity Assessment: Overall WFL for tasks assessed       Communication   Communication: No difficulties  Cognition Arousal/Alertness: Awake/alert Behavior During Therapy: WFL for tasks assessed/performed Overall Cognitive Status: Within Functional Limits for tasks assessed  General Comments      Exercises Other Exercises Other Exercises: pursed lip breathing, relaxation and other pain control strategies attempted for pain relief. Also working on seated posture with feet flat on floor.   Assessment/Plan    PT Assessment Patient needs continued PT services  PT Problem List Decreased activity tolerance;Decreased balance;Decreased mobility;Cardiopulmonary status limiting activity;Pain       PT Treatment Interventions DME instruction;Gait  training;Therapeutic exercise;Balance training    PT Goals (Current goals can be found in the Care Plan section)  Acute Rehab PT Goals Patient Stated Goal: wants to go home PT Goal Formulation: With patient Time For Goal Achievement: 11/06/18 Potential to Achieve Goals: Good    Frequency Min 2X/week   Barriers to discharge        Co-evaluation               AM-PAC PT "6 Clicks" Mobility  Outcome Measure Help needed turning from your back to your side while in a flat bed without using bedrails?: A Lot Help needed moving from lying on your back to sitting on the side of a flat bed without using bedrails?: A Lot Help needed moving to and from a bed to a chair (including a wheelchair)?: Total Help needed standing up from a chair using your arms (e.g., wheelchair or bedside chair)?: Total Help needed to walk in hospital room?: Total Help needed climbing 3-5 steps with a railing? : Total 6 Click Score: 8    End of Session Equipment Utilized During Treatment: Oxygen Activity Tolerance: Patient limited by pain Patient left: in bed;with bed alarm set Nurse Communication: Mobility status PT Visit Diagnosis: Muscle weakness (generalized) (M62.81);Repeated falls (R29.6);History of falling (Z91.81);Difficulty in walking, not elsewhere classified (R26.2);Pain Pain - Right/Left: Right Pain - part of body: (back, ribs)    Time: 7622-6333 PT Time Calculation (min) (ACUTE ONLY): 20 min   Charges:   PT Evaluation $PT Eval Low Complexity: 1 Low PT Treatments $Therapeutic Activity: 8-22 mins        Greggory Stallion, PT, DPT [phone]   Alieah Brinton 10/23/2018, 10:03 AM

## 2018-10-23 NOTE — Care Management Obs Status (Signed)
[place] NOTIFICATION   Patient Details  Name: Darius Miller MRN: [id number] Date of Birth: 1945-05-24   Medicare Observation Status Notification Given:  Yes    Keshonda Monsour A Maiyah Goyne, RN 10/23/2018, 9:00 AM

## 2018-10-24 MED ORDER — FLEET ENEMA 7-19 GM/118ML RE ENEM: 1.0000 | ENEMA | Freq: Once | RECTAL | Status: DC

## 2018-10-24 NOTE — TOC Progression Note (Signed)
Transition of Care [facility]) - Progression Note    Patient Details  Name: Darius Miller MRN: [id number] Date of Birth: Dec 03, 1945  Transition of Care [facility]) CM/SW Selz, RN Phone Number: 10/24/2018, 12:33 PM  Clinical Narrative:     Spoke with Patient and wife in the room and reviewed bed offers, they would like to go to [facility] as it is closer to where they live, I explained it could take a few days to get insurance approval. I notified Otila Kluver with [facility] and she will start the insurance request  Expected Discharge Plan: [place] Barriers to Discharge: No Barriers Identified  Expected Discharge Plan and Services Expected Discharge Plan: [place] In-house Referral: Clinical Social Work Discharge Planning Services: Other - See comment(skilled nursing facility) Post Acute Care Choice: [place] arrangements for the past 2 months: Single Family Home Expected Discharge Date: 10/23/18                                     Social Determinants of Health (SDOH) Interventions    Readmission Risk Interventions No flowsheet data found.

## 2018-10-24 NOTE — TOC Progression Note (Signed)
Transition of Care [facility]) - Progression Note    Patient Details  Name: Arliss Frisina MRN: [id number] Date of Birth: September 03, 1945  Transition of Care [facility]) CM/SW [place], RN Phone Number: 10/24/2018, 10:25 AM  Clinical Narrative:     Reached out the the facilities still pending and requested them to look at the patients referral in the Hub,   Expected Discharge Plan: [place] Barriers to Discharge: No Barriers Identified  Expected Discharge Plan and Services Expected Discharge Plan: [place] In-house Referral: Clinical Social Work Discharge Planning Services: Other - See comment(skilled nursing facility) Post Acute Care Choice: [place] arrangements for the past 2 months: Single Family Home Expected Discharge Date: 10/23/18                                     Social Determinants of Health (SDOH) Interventions    Readmission Risk Interventions No flowsheet data found.

## 2018-10-24 NOTE — Progress Notes (Signed)
Pt has remained alert and oriented. No fever during the night. 2L of acute oxygen while sleeping. No complaint of pain during the night. Pt encouraged to take deep breaths while awake.

## 2018-10-24 NOTE — Progress Notes (Signed)
Physical Therapy Treatment Patient Details Name: Darius Miller MRN: [id number] DOB: [birth year] Today's Date: 10/24/2018    History of Present Illness Pt admitted for fall while walking dog as well as fever. Imaging revealed R 2nd/3rd acute rib fractures. History includes Afib, CAD, and HTN.    PT Comments    Pt very lethargic today and presented as hypoxic when nurse tech in to monitor vitals.  Pt placed on 2L of O2.  He was able to participate partially in supine there ex and reported pain increase in R LE.  Pt attempted to sit at bedside, requiring Max A and stated that pain was too intense, requesting to return to bed.  PT assisted pt in positioning for comfort and educated pt and pt's wife concerning importance of positioning for prevention of pressure ulcers.  Both were open to education.  Pt will continue to benefit from skilled PT with focus on strength, pain management, tolerance to activity and positioning.  SNF placement is still appropriate for discharge planning.   Follow Up Recommendations  SNF     Equipment Recommendations  Rolling walker with 5" wheels    Recommendations for Other Services       Precautions / Restrictions Precautions Precautions: Fall Restrictions Weight Bearing Restrictions: No    Mobility  Bed Mobility Overal bed mobility: Needs Assistance Bed Mobility: Supine to Sit     Supine to sit: Max assist     General bed mobility comments: Required Max A to attempt to bring trunk upright and to bring LE's over EOB.  Pt very vocal about pain and was unable to complete sitting up at EOB.  Transfers                 General transfer comment: unable to tolerate standing/ambulation due to pain  Ambulation/Gait                 Stairs             Wheelchair Mobility    Modified Rankin (Stroke Patients Only)       Balance Overall balance assessment: History of Falls;Needs assistance Sitting-balance support: Feet  supported;Bilateral upper extremity supported Sitting balance-Leahy Scale: Poor Sitting balance - Comments: L lateral leaning                                    Cognition Arousal/Alertness: Lethargic Behavior During Therapy: WFL for tasks assessed/performed Overall Cognitive Status: Within Functional Limits for tasks assessed                                 General Comments: Kept eyes closed throughout session; followed directions 80% of time.      Exercises General Exercises - Lower Extremity Ankle Circles/Pumps: 20 reps;Both;Supine Quad Sets: Strengthening;Both;10 reps;Supine Heel Slides: AAROM;Both;10 reps;Supine Hip ABduction/ADduction: Both;10 reps;Supine Other Exercises Other Exercises: Nurse tech in to monitor vitals and assist with postioning pt.  Pt hypoxic and RN notified.  Cannula placed with 2L of O2.  x10 min. Other Exercises: Education about postioning and assistance in positioning with pillows.  x4 min    General Comments        Pertinent Vitals/Pain Pain Assessment: Faces Faces Pain Scale: Hurts even more Pain Location: back, R side, head Pain Descriptors / Indicators: Aching Pain Intervention(s): Limited activity within patient's tolerance;Repositioned    Home Living  Prior Function            PT Goals (current goals can now be found in the care plan section) Acute Rehab PT Goals Patient Stated Goal: wants to go home PT Goal Formulation: With patient Time For Goal Achievement: 11/06/18 Potential to Achieve Goals: Good Progress towards PT goals: PT to reassess next treatment    Frequency    Min 2X/week      PT Plan Current plan remains appropriate    Co-evaluation              AM-PAC PT "6 Clicks" Mobility   Outcome Measure  Help needed turning from your back to your side while in a flat bed without using bedrails?: A Lot Help needed moving from lying on your back to  sitting on the side of a flat bed without using bedrails?: A Lot Help needed moving to and from a bed to a chair (including a wheelchair)?: Total Help needed standing up from a chair using your arms (e.g., wheelchair or bedside chair)?: Total Help needed to walk in hospital room?: Total Help needed climbing 3-5 steps with a railing? : Total 6 Click Score: 8    End of Session Equipment Utilized During Treatment: Oxygen Activity Tolerance: Patient limited by pain Patient left: in bed;with bed alarm set;with family/visitor present;with call bell/phone within reach Nurse Communication: Mobility status PT Visit Diagnosis: Muscle weakness (generalized) (M62.81);Repeated falls (R29.6);History of falling (Z91.81);Difficulty in walking, not elsewhere classified (R26.2);Pain Pain - Right/Left: Right Pain - part of body: (back, ribs)     Time: 6644-0347 PT Time Calculation (min) (ACUTE ONLY): 36 min  Charges:  $Therapeutic Exercise: 8-22 mins $Therapeutic Activity: 8-22 mins                     Roxanne Gates, PT, DPT    Roxanne Gates 10/24/2018, 5:00 PM

## 2018-10-25 ENCOUNTER — Inpatient Hospital Stay: Payer: Medicare HMO

## 2018-10-25 ENCOUNTER — Observation Stay: Payer: Medicare HMO

## 2018-10-25 ENCOUNTER — Encounter: Payer: Self-pay | Admitting: Radiology

## 2018-10-25 DIAGNOSIS — Z8673 Personal history of transient ischemic attack (TIA), and cerebral infarction without residual deficits: Secondary | ICD-10-CM | POA: Diagnosis not present

## 2018-10-25 DIAGNOSIS — S2249XA Multiple fractures of ribs, unspecified side, initial encounter for closed fracture: Secondary | ICD-10-CM | POA: Diagnosis present

## 2018-10-25 DIAGNOSIS — I5043 Acute on chronic combined systolic (congestive) and diastolic (congestive) heart failure: Secondary | ICD-10-CM | POA: Diagnosis present

## 2018-10-25 DIAGNOSIS — I110 Hypertensive heart disease with heart failure: Secondary | ICD-10-CM | POA: Diagnosis present

## 2018-10-25 DIAGNOSIS — Z955 Presence of coronary angioplasty implant and graft: Secondary | ICD-10-CM | POA: Diagnosis not present

## 2018-10-25 DIAGNOSIS — Z515 Encounter for palliative care: Secondary | ICD-10-CM | POA: Diagnosis present

## 2018-10-25 DIAGNOSIS — I2510 Atherosclerotic heart disease of native coronary artery without angina pectoris: Secondary | ICD-10-CM | POA: Diagnosis present

## 2018-10-25 DIAGNOSIS — R509 Fever, unspecified: Secondary | ICD-10-CM | POA: Diagnosis not present

## 2018-10-25 DIAGNOSIS — G92 Toxic encephalopathy: Secondary | ICD-10-CM | POA: Diagnosis present

## 2018-10-25 DIAGNOSIS — J189 Pneumonia, unspecified organism: Secondary | ICD-10-CM | POA: Diagnosis not present

## 2018-10-25 DIAGNOSIS — I472 Ventricular tachycardia: Secondary | ICD-10-CM | POA: Diagnosis present

## 2018-10-25 DIAGNOSIS — I429 Cardiomyopathy, unspecified: Secondary | ICD-10-CM | POA: Diagnosis not present

## 2018-10-25 DIAGNOSIS — I4821 Permanent atrial fibrillation: Secondary | ICD-10-CM | POA: Diagnosis present

## 2018-10-25 DIAGNOSIS — Z7982 Long term (current) use of aspirin: Secondary | ICD-10-CM | POA: Diagnosis not present

## 2018-10-25 DIAGNOSIS — I480 Paroxysmal atrial fibrillation: Secondary | ICD-10-CM | POA: Diagnosis present

## 2018-10-25 DIAGNOSIS — W1830XA Fall on same level, unspecified, initial encounter: Secondary | ICD-10-CM | POA: Diagnosis present

## 2018-10-25 DIAGNOSIS — R42 Dizziness and giddiness: Secondary | ICD-10-CM | POA: Diagnosis not present

## 2018-10-25 DIAGNOSIS — Z7189 Other specified counseling: Secondary | ICD-10-CM | POA: Diagnosis not present

## 2018-10-25 DIAGNOSIS — Z888 Allergy status to other drugs, medicaments and biological substances status: Secondary | ICD-10-CM | POA: Diagnosis not present

## 2018-10-25 DIAGNOSIS — Z952 Presence of prosthetic heart valve: Secondary | ICD-10-CM | POA: Diagnosis not present

## 2018-10-25 DIAGNOSIS — Z87891 Personal history of nicotine dependence: Secondary | ICD-10-CM | POA: Diagnosis not present

## 2018-10-25 DIAGNOSIS — Z95810 Presence of automatic (implantable) cardiac defibrillator: Secondary | ICD-10-CM | POA: Diagnosis not present

## 2018-10-25 DIAGNOSIS — Z87442 Personal history of urinary calculi: Secondary | ICD-10-CM | POA: Diagnosis not present

## 2018-10-25 DIAGNOSIS — J449 Chronic obstructive pulmonary disease, unspecified: Secondary | ICD-10-CM | POA: Diagnosis present

## 2018-10-25 DIAGNOSIS — J984 Other disorders of lung: Secondary | ICD-10-CM | POA: Diagnosis not present

## 2018-10-25 DIAGNOSIS — Z66 Do not resuscitate: Secondary | ICD-10-CM | POA: Diagnosis present

## 2018-10-25 DIAGNOSIS — S2239XA Fracture of one rib, unspecified side, initial encounter for closed fracture: Secondary | ICD-10-CM | POA: Diagnosis present

## 2018-10-25 DIAGNOSIS — S2241XA Multiple fractures of ribs, right side, initial encounter for closed fracture: Secondary | ICD-10-CM | POA: Diagnosis present

## 2018-10-25 DIAGNOSIS — S0990XA Unspecified injury of head, initial encounter: Secondary | ICD-10-CM | POA: Diagnosis present

## 2018-10-25 DIAGNOSIS — J9811 Atelectasis: Secondary | ICD-10-CM | POA: Diagnosis present

## 2018-10-25 DIAGNOSIS — R0602 Shortness of breath: Secondary | ICD-10-CM | POA: Diagnosis not present

## 2018-10-25 DIAGNOSIS — Z20828 Contact with and (suspected) exposure to other viral communicable diseases: Secondary | ICD-10-CM | POA: Diagnosis present

## 2018-10-25 DIAGNOSIS — R918 Other nonspecific abnormal finding of lung field: Secondary | ICD-10-CM | POA: Diagnosis not present

## 2018-10-25 DIAGNOSIS — J9601 Acute respiratory failure with hypoxia: Secondary | ICD-10-CM | POA: Diagnosis present

## 2018-10-25 DIAGNOSIS — J690 Pneumonitis due to inhalation of food and vomit: Secondary | ICD-10-CM | POA: Diagnosis not present

## 2018-10-25 DIAGNOSIS — Z951 Presence of aortocoronary bypass graft: Secondary | ICD-10-CM | POA: Diagnosis not present

## 2018-10-25 DIAGNOSIS — J810 Acute pulmonary edema: Secondary | ICD-10-CM | POA: Diagnosis not present

## 2018-10-25 LAB — BRAIN NATRIURETIC PEPTIDE: B Natriuretic Peptide: 346 pg/mL — ABNORMAL HIGH (ref 0.0–100.0)

## 2018-10-25 LAB — SARS CORONAVIRUS 2 BY RT PCR (HOSPITAL ORDER, PERFORMED IN ~~LOC~~ HOSPITAL LAB): SARS Coronavirus 2: NEGATIVE

## 2018-10-25 LAB — ABO/RH: ABO/RH(D): A POS

## 2018-10-25 MED ORDER — FUROSEMIDE 10 MG/ML IJ SOLN
20.0000 mg | Freq: Once | INTRAMUSCULAR | Status: AC
  Administered 2018-10-25: 20 mg via INTRAVENOUS
  Filled 2018-10-25: qty 4

## 2018-10-25 MED ORDER — IPRATROPIUM-ALBUTEROL 0.5-2.5 (3) MG/3ML IN SOLN
3.0000 mL | RESPIRATORY_TRACT | Status: DC
  Administered 2018-10-25 – 2018-10-26 (×7): 3 mL via RESPIRATORY_TRACT
  Filled 2018-10-25 (×8): qty 3

## 2018-10-25 MED ORDER — IOHEXOL 350 MG/ML SOLN
75.0000 mL | Freq: Once | INTRAVENOUS | Status: AC | PRN
  Administered 2018-10-25: 75 mL via INTRAVENOUS

## 2018-10-25 MED ORDER — IPRATROPIUM-ALBUTEROL 0.5-2.5 (3) MG/3ML IN SOLN
3.0000 mL | RESPIRATORY_TRACT | Status: DC | PRN
  Administered 2018-11-05 – 2018-11-07 (×2): 3 mL via RESPIRATORY_TRACT
  Filled 2018-10-25 (×4): qty 3

## 2018-10-25 MED ORDER — CEFDINIR 300 MG PO CAPS: 300.0000 mg | ORAL_CAPSULE | Freq: Two times a day (BID) | ORAL | 0 refills | Status: DC

## 2018-10-25 MED ORDER — PHENOL 1.4 % MT LIQD
1.0000 | OROMUCOSAL | Status: DC | PRN
  Filled 2018-10-25: qty 177

## 2018-10-25 NOTE — Progress Notes (Signed)
[place] at [place] NAME: Darius Miller    MR#:  [id number]  DATE OF BIRTH:  11-11-1945  SUBJECTIVE:  Doing ok this am waiting for SNF bed  REVIEW OF SYSTEMS:    Review of Systems  Constitutional: Negative for fever, chills weight loss HENT: Negative for ear pain, nosebleeds, congestion, facial swelling, rhinorrhea, neck pain, neck stiffness and ear discharge.   Respiratory: Negative for cough, shortness of breath, wheezing  Cardiovascular: Negative for chest pain, palpitations and leg swelling.  Gastrointestinal: Negative for heartburn, abdominal pain, vomiting, diarrhea or consitpation Genitourinary: Negative for dysuria, urgency, frequency, hematuria Musculoskeletal: Negative for back pain or joint pain  + rib fx pain better slightly from y esterdayNeurological: Negative for dizziness, seizures, syncope, focal weakness,  numbness and headaches.  Hematological: Does not bruise/bleed easily.  Psychiatric/Behavioral: Negative for hallucinations, confusion, dysphoric mood    Tolerating Diet: yes      DRUG ALLERGIES:   Allergies  Allergen Reactions  . Flomax [Tamsulosin Hcl]     VITALS:  Blood pressure 112/74, pulse 80, temperature (!) 97.5 F (36.4 C), temperature source Oral, resp. rate 17, height 5\' 11"  (1.803 m), weight 76.7 kg, SpO2 93 %.  PHYSICAL EXAMINATION:  Constitutional: Appears well-developed and well-nourished. No distress. HENT: Normocephalic. Marland Kitchen Oropharynx is clear and moist.  Eyes: Conjunctivae and EOM are normal. PERRLA, no scleral icterus.  Neck: Normal ROM. Neck supple. No JVD. No tracheal deviation. CVS: RRR, S1/S2 +, no murmurs, no gallops, no carotid bruit.  Pulmonary: Effort and breath sounds normal, no stridor, rhonchi, wheezes, rales.  Abdominal: Soft. BS +,  no distension, tenderness, rebound or guarding.  Musculoskeletal: Normal range of motion. No edema and no tenderness.  Neuro: Alert. CN 2-12  grossly intact. No focal deficits. Skin: Skin is warm and dry. No rash noted. Psychiatric: Normal mood and affect.      LABORATORY PANEL:   CBC Recent Labs  Lab 10/23/18 0422  WBC 9.0  HGB 12.3*  HCT 38.6*  PLT 166   ------------------------------------------------------------------------------------------------------------------  Chemistries  Recent Labs  Lab 10/22/18 1040 10/23/18 0422  NA 140 137  K 3.7 3.5  CL 101 102  CO2 30 29  GLUCOSE 128* 124*  BUN 14 13  CREATININE 1.13 0.96  CALCIUM 8.9 8.6*  AST 44*  --   ALT 37  --   ALKPHOS 117  --   BILITOT 0.9  --    ------------------------------------------------------------------------------------------------------------------  Cardiac Enzymes No results for input(s): TROPONINI in the last 168 hours. ------------------------------------------------------------------------------------------------------------------  RADIOLOGY:     ASSESSMENT AND PLAN:   73 year old male with PAF and CAD who presented after mechanical fall and suffered rib fracture.  1.  Fever: This is due to atelectasis.  Patient has no fevers currently.  Patient is encouraged to use incentive spirometer.  Atelectasis due to rib fracture.  2.  Rib fractures: Patient is being treated conservatively.  Rib fractures are status post mechanical fall.  CT head and CT C-spine were negative for acute fracture. Continue incentive spirometer.  3.  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  CAD status post stents: Continue aspirin, metoprolol, and Lipitor.  5.  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  COPD without signs of exacerbation: Continue inhalers  7.  Essential hypertension: Patient will continue on metoprolol 8. Acute respiratory failure: Respiratory failure was  due to patient's atelectasis  and rib fractures  He needs O2 at discharge  Management plans discussed with the patient and he is in agreement.  CODE STATUS: full  TOTAL TIME TAKING CARE OF THIS PATIENT: 22 minutes.     POSSIBLE D/C today snf, DEPENDING ON insurance  Adrian Saran M.D on 10/24/2018 at 9:22 AM  Between 7am to 6pm - Pager - [phone] After 6pm go to [email or web address] - password [email or web address]  [facility] [place]  Office  [phone]  CC: Primary care physician; Darius Nurse, MD  Note: This dictation was prepared with Dragon dictation along with smaller phrase technology. Any transcriptional errors that result from this process are unintentional.

## 2018-10-25 NOTE — Progress Notes (Signed)
Called report to [place] [facility]. Answered all questions. EMS called for transport, 4th in line to be picked up

## 2018-10-25 NOTE — Progress Notes (Addendum)
PT O2 in low 60s on 2 L oxygen. O2 increased to 3 L. O2 sats 77%. Respitory therapist contacted and he come to the pt room. Pt Sat's 79% on 6 Litre's oxygen. Dr. Lajoyce Lauber made aware. Advice to put the patient on high flow oxygen which was done by raspatory therapist. Pt O2 sat 95% on high flow. Order for lasix 20 mg IV X 1 dose, Nebulizer every 4 hours prn and CXR also received.

## 2018-10-25 NOTE — Progress Notes (Signed)
MD returned page. Notified to review xray results. MD instructed RN to give another 20 mg of lasix is BP was above 308 systolic.

## 2018-10-25 NOTE — Progress Notes (Signed)
[place] at [place] NAME: Darius Miller    MR#:  [id number]  DATE OF BIRTH:  08-30-1945  SUBJECTIVE:  Was on NRB last night for sob given lasix and ABX feeling much better this am  REVIEW OF SYSTEMS:    Review of Systems  Constitutional: Negative for fever, chills weight loss HENT: Negative for ear pain, nosebleeds, congestion, facial swelling, rhinorrhea, neck pain, neck stiffness and ear discharge.   Respiratory: Negative for cough, shortness of breath, wheezing  Cardiovascular: Negative for chest pain, palpitations and leg swelling.  Gastrointestinal: Negative for heartburn, abdominal pain, vomiting, diarrhea or consitpation Genitourinary: Negative for dysuria, urgency, frequency, hematuria Musculoskeletal: Negative for back pain or joint pain   Neurological: Negative for dizziness, seizures, syncope, focal weakness,  numbness and headaches.  Hematological: Does not bruise/bleed easily.  Psychiatric/Behavioral: Negative for hallucinations, confusion, dysphoric mood    Tolerating Diet: yes      DRUG ALLERGIES:   Allergies  Allergen Reactions  . Flomax [Tamsulosin Hcl]     VITALS:  Blood pressure 112/74, pulse 80, temperature (!) 97.5 F (36.4 C), temperature source Oral, resp. rate 17, height 5\' 11"  (1.803 m), weight 76.7 kg, SpO2 93 %.  PHYSICAL EXAMINATION:  Constitutional: Appears well-developed and well-nourished. No distress. HENT: Normocephalic. Marland Kitchen Oropharynx is clear and moist.  Eyes: Conjunctivae and EOM are normal. PERRLA, no scleral icterus.  Neck: Normal ROM. Neck supple. No JVD. No tracheal deviation. CVS: RRR, S1/S2 +, no murmurs, no gallops, no carotid bruit.  Pulmonary: decreased at bases no craackles .  Abdominal: Soft. BS +,  no distension, tenderness, rebound or guarding.  Musculoskeletal: Normal range of motion. No edema and no tenderness.  Neuro: Alert. CN 2-12 grossly intact. No focal deficits. Skin:  Skin is warm and dry. No rash noted. Psychiatric: Normal mood and affect.      LABORATORY PANEL:   CBC Recent Labs  Lab 10/23/18 0422  WBC 9.0  HGB 12.3*  HCT 38.6*  PLT 166   ------------------------------------------------------------------------------------------------------------------  Chemistries  Recent Labs  Lab 10/22/18 1040 10/23/18 0422  NA 140 137  K 3.7 3.5  CL 101 102  CO2 30 29  GLUCOSE 128* 124*  BUN 14 13  CREATININE 1.13 0.96  CALCIUM 8.9 8.6*  AST 44*  --   ALT 37  --   ALKPHOS 117  --   BILITOT 0.9  --    ------------------------------------------------------------------------------------------------------------------  Cardiac Enzymes No results for input(s): TROPONINI in the last 168 hours. ------------------------------------------------------------------------------------------------------------------  RADIOLOGY:     ASSESSMENT AND PLAN:   73 year old male with PAF and CAD who presented after mechanical fall and suffered rib fracture.  1.  Fever: This is due to atelectasis.  Patient has no fevers currently.  Patient is encouraged to use incentive spirometer.  Atelectasis due to rib fracture.  2.  Rib fractures: Patient is being treated conservatively.  Rib fractures are status post mechanical fall.  CT head and CT C-spine were negative for acute fracture. Continue incentive spirometer.  3.  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  CAD status post stents: Continue aspirin, metoprolol, and Lipitor.  5.  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  COPD without signs of exacerbation: Continue inhalers  7.  Essential hypertension: Patient will continue on metoprolol 8. Acute respiratory failure: Respiratory failure was due to patient's  atelectasis and rib fractures and pneumonia from  atelectasis. Ct scan was negative for PE however shous consolidation in the lung field indicative of a pneumonia.   Patient was placed temporarily on nonrebreather and now back on nasal cannula.  He will need to continue nasal cannula.  We encourage using incentive spirometer.  He will be discharged on ntibiotics.  THe will need antibiotics for a total of 5 days.  Stop date will be October 29, 2018.  COVID negative Management plans discussed with the patient and he is in agreement.  CODE STATUS: full  TOTAL TIME TAKING CARE OF THIS PATIENT: 28 minutes.     POSSIBLE D/C today snf, DEPENDING ON insurance  Adrian Saran M.D on 10/24/2018 at 11:22 AM  Between 7am to 6pm - Pager - [phone] After 6pm go to [email or web address] - password [email or web address]  [facility] [place] [facility]  Office  [phone]  CC: Primary care physician; Gracelyn Nurse, MD  Note: This dictation was prepared with Dragon dictation along with smaller phrase technology. Any transcriptional errors that result from this process are unintentional.

## 2018-10-25 NOTE — Progress Notes (Signed)
Darius Miller called about CXR and BNP results. Order for lasix 20mg  received.

## 2018-10-25 NOTE — Progress Notes (Signed)
EMS at bedside to pick the pt. VS done O2 sats 88 on high flow.O2 [facility] [employer] made aware. Angela-NP at the bedside at this time.

## 2018-10-25 NOTE — TOC Transition Note (Signed)
Transition of Care [facility]) - CM/SW Discharge Note   Patient Details  Name: Darius Miller MRN: [id number] Date of Birth: 12-Feb-1946  [facility]) CM/SW Contact:  Su Hilt, RN Phone Number: 10/25/2018, 1:45 PM   Clinical Narrative:    Otila Kluver from [facility] called and stated that they got Auth approval for the patient to DC today to room 709, report can be called by the bedside nurse to [phone] The patient will transfer via EMS   Final next level of care: Skilled Nursing Facility Barriers to Discharge: No Barriers Identified   Patient Goals and CMS Choice Patient states their goals for this hospitalization and ongoing recovery are:: "My wife cannot handle me at home right now"      Discharge Placement                       Discharge Plan and Services In-house Referral: Clinical Social Work Discharge Planning Services: Other - See comment(skilled nursing facility) Post Acute Care Choice: Skilled Nursing Facility                               Social Determinants of Health (SDOH) Interventions     Readmission Risk Interventions No flowsheet data found.

## 2018-10-25 NOTE — Progress Notes (Addendum)
[place] at [place] NAME: Darius Miller    MR#:  [id number]  DATE OF BIRTH:  06/15/1945  SUBJECTIVE:  Rib fx pain  REVIEW OF SYSTEMS:    Review of Systems  Constitutional: Negative for fever, chills weight loss HENT: Negative for ear pain, nosebleeds, congestion, facial swelling, rhinorrhea, neck pain, neck stiffness and ear discharge.   Respiratory: Negative for cough, shortness of breath, wheezing  Cardiovascular: Negative for chest pain, palpitations and leg swelling.  Gastrointestinal: Negative for heartburn, abdominal pain, vomiting, diarrhea or consitpation Genitourinary: Negative for dysuria, urgency, frequency, hematuria Musculoskeletal: Negative for back pain or joint pain  + rib fx pain Neurological: Negative for dizziness, seizures, syncope, focal weakness,  numbness and headaches.  Hematological: Does not bruise/bleed easily.  Psychiatric/Behavioral: Negative for hallucinations, confusion, dysphoric mood    Tolerating Diet: yes      DRUG ALLERGIES:   Allergies  Allergen Reactions  . Flomax [Tamsulosin Hcl]     VITALS:  Blood pressure 112/74, pulse 80, temperature (!) 97.5 F (36.4 C), temperature source Oral, resp. rate 17, height 5\' 11"  (1.803 m), weight 76.7 kg, SpO2 93 %.  PHYSICAL EXAMINATION:  Constitutional: Appears well-developed and well-nourished. No distress. HENT: Normocephalic. Marland Kitchen Oropharynx is clear and moist.  Eyes: Conjunctivae and EOM are normal. PERRLA, no scleral icterus.  Neck: Normal ROM. Neck supple. No JVD. No tracheal deviation. CVS: RRR, S1/S2 +, no murmurs, no gallops, no carotid bruit.  Pulmonary: Effort and breath sounds normal, no stridor, rhonchi, wheezes, rales.  Abdominal: Soft. BS +,  no distension, tenderness, rebound or guarding.  Musculoskeletal: Normal range of motion. No edema and no tenderness.  Neuro: Alert. CN 2-12 grossly intact. No focal deficits. Skin: Skin is warm and  dry. No rash noted. Psychiatric: Normal mood and affect.      LABORATORY PANEL:   CBC Recent Labs  Lab 10/23/18 0422  WBC 9.0  HGB 12.3*  HCT 38.6*  PLT 166   ------------------------------------------------------------------------------------------------------------------  Chemistries  Recent Labs  Lab 10/22/18 1040 10/23/18 0422  NA 140 137  K 3.7 3.5  CL 101 102  CO2 30 29  GLUCOSE 128* 124*  BUN 14 13  CREATININE 1.13 0.96  CALCIUM 8.9 8.6*  AST 44*  --   ALT 37  --   ALKPHOS 117  --   BILITOT 0.9  --    ------------------------------------------------------------------------------------------------------------------  Cardiac Enzymes No results for input(s): TROPONINI in the last 168 hours. ------------------------------------------------------------------------------------------------------------------  RADIOLOGY:      ASSESSMENT AND PLAN:   73 year old male with PAF and CAD who presented after mechanical fall and suffered rib fracture.  1.  Fever: This is due to atelectasis.  Patient has no fevers currently.  Patient is encouraged to use incentive spirometer.  Atelectasis due to rib fracture.  2.  Rib fractures: Patient is being treated conservatively.  Rib fractures are status post mechanical fall.  CT head and CT C-spine were negative for acute fracture. Continue incentive spirometer.  3.  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  CAD status post stents: Continue aspirin, metoprolol, and Lipitor.  5.  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  COPD without signs of exacerbation: Continue inhalers  7.  Essential hypertension: Patient will continue on metoprolol 8. Acute respiratory failure: Respiratory failure was due to patient's atelectasis and rib fractures  Management plans discussed  with the patient and he is in agreement.  CODE STATUS: full  TOTAL TIME TAKING CARE OF THIS PATIENT: 30 minutes.     POSSIBLE D/C today snf, DEPENDING ON insurance  Adrian Saran M.D on 10/25/2018 at 9:20 AM  Between 7am to 6pm - Pager - [phone] After 6pm go to [email or web address] - password [email or web address]  [facility] [place] [facility]  Office  [phone]  CC: Primary care physician; Gracelyn Nurse, MD  Note: This dictation was prepared with Dragon dictation along with smaller phrase technology. Any transcriptional errors that result from this process are unintentional.

## 2018-10-25 NOTE — Progress Notes (Signed)
EMS at bedside to transport patient to care facility.  However, vital signs at the time of transfer show decreased oxygen saturation to 86% on 10L high flow oxygen.  We had him use the incentive spirometer and cough to remove secretions.  This was unsuccessful in increasing oxygen saturation greater than 88% on 10L high flow oxygen.    Therefore patient will remain inpatient and we will get BNP and chest xray for further evaluation.  His oxygen saturation increased to 92% on high flow oxygen at 15 liters.  Will use incentive spirometer Q1H while awake and document oxygen saturation every 4 hours.    Will follow up results of chest xray and BNP.

## 2018-10-26 DIAGNOSIS — R918 Other nonspecific abnormal finding of lung field: Secondary | ICD-10-CM

## 2018-10-26 DIAGNOSIS — J810 Acute pulmonary edema: Secondary | ICD-10-CM

## 2018-10-26 DIAGNOSIS — J189 Pneumonia, unspecified organism: Secondary | ICD-10-CM

## 2018-10-26 DIAGNOSIS — J9601 Acute respiratory failure with hypoxia: Secondary | ICD-10-CM

## 2018-10-26 LAB — BASIC METABOLIC PANEL
Anion gap: 10 (ref 5–15)
BUN: 25 mg/dL — ABNORMAL HIGH (ref 8–23)
CO2: 30 mmol/L (ref 22–32)
Calcium: 8.9 mg/dL (ref 8.9–10.3)
Chloride: 97 mmol/L — ABNORMAL LOW (ref 98–111)
Creatinine, Ser: 1.03 mg/dL (ref 0.61–1.24)
GFR calc Af Amer: >60 mL/min (ref >60)
GFR calc non Af Amer: >60 mL/min (ref >60)
Glucose, Bld: 141 mg/dL — ABNORMAL HIGH (ref 70–99)
Potassium: 3.7 mmol/L (ref 3.5–5.1)
Sodium: 137 mmol/L (ref 135–145)

## 2018-10-26 LAB — BRAIN NATRIURETIC PEPTIDE: B Natriuretic Peptide: 464 pg/mL — ABNORMAL HIGH (ref 0.0–100.0)

## 2018-10-26 LAB — GLUCOSE, CAPILLARY: Glucose-Capillary: 148 mg/dL — ABNORMAL HIGH (ref 70–99)

## 2018-10-26 LAB — SEDIMENTATION RATE: Sed Rate: 53 mm/hr — ABNORMAL HIGH (ref 0–20)

## 2018-10-26 LAB — PROCALCITONIN: Procalcitonin: 0.57 ng/mL

## 2018-10-26 MED ORDER — DEXMEDETOMIDINE HCL IN NACL 400 MCG/100ML IV SOLN
0.4000 ug/kg/h | INTRAVENOUS | Status: DC
  Administered 2018-10-26: 21:00:00 0.6 ug/kg/h via INTRAVENOUS
  Administered 2018-10-26 – 2018-10-27 (×2): 1.2 ug/kg/h via INTRAVENOUS
  Filled 2018-10-26 (×2): qty 100

## 2018-10-26 MED ORDER — SODIUM CHLORIDE 0.9 % IV SOLN
100.0000 mg | Freq: Two times a day (BID) | INTRAVENOUS | Status: DC
  Administered 2018-10-26 – 2018-10-28 (×4): 100 mg via INTRAVENOUS
  Filled 2018-10-26 (×6): qty 100

## 2018-10-26 MED ORDER — FUROSEMIDE 20 MG PO TABS
40.0000 mg | ORAL_TABLET | Freq: Every day | ORAL | Status: DC
  Administered 2018-10-27 – 2018-11-02 (×7): 40 mg via ORAL
  Filled 2018-10-26 (×8): qty 2

## 2018-10-26 MED ORDER — METHYLPREDNISOLONE SODIUM SUCC 40 MG IJ SOLR
40.0000 mg | Freq: Two times a day (BID) | INTRAMUSCULAR | Status: DC
  Administered 2018-10-26 – 2018-10-27 (×2): 40 mg via INTRAVENOUS
  Filled 2018-10-26 (×2): qty 1

## 2018-10-26 MED ORDER — MORPHINE SULFATE (PF) 2 MG/ML IV SOLN
2.0000 mg | INTRAVENOUS | Status: DC | PRN
  Administered 2018-10-30 – 2018-11-06 (×13): 2 mg via INTRAVENOUS
  Filled 2018-10-26 (×13): qty 1

## 2018-10-26 MED ORDER — SODIUM CHLORIDE 0.9 % IV SOLN
2.0000 g | Freq: Three times a day (TID) | INTRAVENOUS | Status: DC
  Administered 2018-10-26 – 2018-10-28 (×7): 2 g via INTRAVENOUS
  Filled 2018-10-26 (×10): qty 2

## 2018-10-26 MED ORDER — FUROSEMIDE 10 MG/ML IJ SOLN
20.0000 mg | Freq: Once | INTRAMUSCULAR | Status: AC
  Administered 2018-10-26: 20 mg via INTRAVENOUS
  Filled 2018-10-26: qty 4

## 2018-10-26 NOTE — Progress Notes (Signed)
Notified Dr. Benjie Karvonen that patient is now saturating in the high 80's and is on HFNC 55 lpm at 100%.  Patient was given breathing treatment at this time with no change in patient status.  Patient remains very confused and does not have good inspiratory effort.

## 2018-10-26 NOTE — Consult Note (Addendum)
Name: Darius Miller MRN: [id number] DOB: 10/29/1945     CONSULTATION DATE: 10/22/2018  REFERRING MD :  Benjie Karvonen  CHIEF COMPLAINT:  Shortness of breath  HISTORY OF PRESENT ILLNESS:  73 year old male with history of Afib, and Vtach (hx wide complex tachycardia) - managed with ICD (biventricular), CAD s/p RCA stenting, CVA, HTN, mitral valve repair, nephrolithiasis, and BPH seen here in the ICU requiring BiPAP for acute hypoxic respiratory failure. He presented to the ED on 10/22/2018, after falling and hitting right chest wall and head. He notes that he was pulled by his dog while taking him for a walk, became light-headed, and fell. He has been experiencing light headedness for the past month. When he presented to the ED on 10/22/2018, he was hypertensive (155/98), and febrile (38.3). Initial CXR indicated cardiomegaly with extensive pleural calcifications. CTH and CT C-spine with no intracranial trauma, or spinal fracture. CBC, CMP and HS troponins unremarkable.  UA with Hgb, trace leukocytes and rare bacteria. Culture with < 10,000 colonies - insiginficant growth. PCT < 0.10. He was admitted to the med surg floor. Started on Azithromycin and Rocephin.   STUDIES/EVENTS 10/22/2018: ED s/p fall. 10/23/2018: Continued to be febrile. Required O2 via [place]. CSW with recommendation to transfer to SNF - [facility] 10/25/2018: Plan was to discharge to [facility] today. He was hypoxic (60's on 2L oxygen). Started on high flow. 1 dose Lasix given. [facility] came to pick him up. Patient at that time was on 10 L high flow with SpO2 @ 86%. Kept in hospital.  BNP baseline 346.0. CXR completed --> stable cardiomegaly, bronchovascular crowding and vascular congestion, and bilateral calcified pleural plaques.  10/26/18: On High Flow - to 55L @ 100% FiO2. Started on Cefepime.  9 AM patient on HFNC 55 lpm @ 100% - with O2 saturation in the high 80's, and was lethargic. Patient admitted to ICU for further  management.  Currently he is on BiPAP. Patient endorses shortness of breath, and occasional non-productive cough. Patient's mentation seems to wax and wane since being in the hospital, including thinking his dog is here in the hospital with him. He is not currently dizzy or lightheaded. He denies current chest pain/tightness, but notes that he had in the past. He denies changes in bowel habits, constipation, diarrhea, abdominal pain.   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 He also had had ~ 23 lbs of unintentional weight loss over the past 3 months, per patient's wife who is in the room.   PAST MEDICAL HISTORY :   has a past medical history of Atrial fibrillation ([place]), Coronary artery disease  s/p RCA stents, History of mitral valve repair, Hypertension, ICD (implantable cardioverter-defibrillator), biventricular, [employer] NO  atrial lead, Kidney stones, Stroke [facility]), and Ventricular tachycardia ([place]).  has a past surgical history that includes Mitral valve repair (October 16, 2014); Cardiac catheterization; Coronary angioplasty; Coronary stent 2009 (2009); and ICD IMPLANT. Prior to Admission medications   Medication Sig Start Date End Date Taking? Authorizing Provider  acetaminophen (TYLENOL) 500 MG tablet Take 1 tablet by mouth every 8 (eight) hours as needed.   Yes [provider]  amiodarone (PACERONE) 400 MG tablet Take 1 tablet (400 mg total) by mouth 2 (two) times daily. 01/22/18  Yes Bettey Costa, MD  aspirin 81 MG tablet Take 1 tablet (81 mg total) by mouth daily. 06/04/17  Yes Tukov-Yual, Arlyss Gandy, NP  atorvastatin (  LIPITOR) 20 MG tablet Take 20 mg by mouth daily. 12/03/17  Yes [provider]  dabigatran (PRADAXA) 150 MG CAPS capsule Take 1 capsule (150 mg total) by mouth 2 (two) times daily. 06/04/17  Yes Tukov-Yual, Arlyss Gandy, NP  furosemide (LASIX) 40 MG tablet Take 1 tablet (40 mg total) by  mouth daily. 06/04/17  Yes Tukov-Yual, Magdalene S, NP  magnesium oxide (MAG-OX) 400 MG tablet Take 1 tablet by mouth 2 (two) times daily. 11/08/17  Yes [provider]  metoprolol succinate (TOPROL-XL) 25 MG 24 hr tablet Take 0.5 tablets (12.5 mg total) by mouth 2 (two) times daily. 01/22/18  Yes Mody, Ulice Bold, MD  mexiletine (MEXITIL) 200 MG capsule Take 1 capsule (200 mg total) by mouth every 12 (twelve) hours. 01/22/18  Yes Mody, Ulice Bold, MD  nitroGLYCERIN (NITROSTAT) 0.4 MG SL tablet Place 1 tablet (0.4 mg total) under the tongue every 5 (five) minutes as needed for chest pain. 06/04/17  Yes Tukov-Yual, Magdalene S, NP  TRELEGY ELLIPTA 100-62.5-25 MCG/INH AEPB INHALE 1 PUFF INTO THE LUNGS ONCE DAILY 09/01/18  Yes [provider]  venlafaxine XR (EFFEXOR-XR) 75 MG 24 hr capsule Take 75 mg by mouth daily.  09/23/18  Yes [provider]  cefdinir (OMNICEF) 300 MG capsule Take 1 capsule (300 mg total) by mouth 2 (two) times daily for 4 days. 10/25/18 10/29/18  Bettey Costa, MD  oxyCODONE (OXY IR/ROXICODONE) 5 MG immediate release tablet Take 1 tablet (5 mg total) by mouth every 4 (four) hours as needed for moderate pain. 10/23/18   Bettey Costa, MD   Allergies  Allergen Reactions  . Flomax [Tamsulosin Hcl]    FAMILY HISTORY:  family history is not on file. SOCIAL HISTORY:  reports that he has quit smoking. His smoking use included cigarettes. He has a 40.00 pack-year smoking history. He has never used smokeless tobacco.  REVIEW OF SYSTEMS:  Hindered due to patient being on BiPAP. All review of systems listed above in the HPI.   VITAL SIGNS: Temp:  [97.9 F (36.6 C)-99.4 F (37.4 C)] 97.9 F (36.6 C) (08/19 0814) Pulse Rate:  [66-93] 66 (08/19 0936) Resp:  [18-19] 18 (08/19 0136) BP: (99-126)/(67-78) 107/74 (08/19 0936) SpO2:  [82 %-96 %] 96 % (08/19 0936) FiO2 (%):  [100 %] 100 % (08/19 0759)  I/O last 3 completed shifts: In: 120 [P.O.:120] Out: 2150 [Urine:2150] No  intake/output data recorded.  SpO2: 96 % O2 Flow Rate (L/min): 55 L/min FiO2 (%): 100 %  Physical Examination:  GENERAL: On BiPAP. Laying comfortably. Non-toxic. HEAD: Normocephalic, atraumatic.  EYES: Pupils equal, round, reactive to light.  No scleral icterus.  MOUTH: On BiPAP NECK: Supple. No JVD.  PULMONARY: Diffuse wheezing, bilaterally. CARDIOVASCULAR: S1 and S2. Regular rate and rhythm. No murmurs, rubs, or gallops.  GASTROINTESTINAL:  Epigastric and left sided tenderness to palpation. RUQ pain secondary to fall/fracture. Soft lateral LUQ mass/swelling, ~ 20 cm in length. Non-distended. Positive bowel sounds.  EXTREMITIES: No edema. DP pulses 1+ bilaterally. Radial pulses 2+ bilaterally. NEUROLOGIC: Alert and oriented x 3. Able to follow commands, lift arms, and move toes. SKIN: Atrophic skin changes of calves bilaterally. Otherwise warm, dry and intact.  I personally reviewed lab work that was obtained in last 24 hrs.  CBC Latest Ref Rng & Units 10/23/2018 10/22/2018 01/20/2018  WBC 4.0 - 10.5 K/uL 9.0 6.0 6.8  Hemoglobin 13.0 - 17.0 g/dL 12.3(L) 14.5 13.2  Hematocrit 39.0 - 52.0 % 38.6(L) 45.7 41.5  Platelets 150 -  400 K/uL 166 180 183   CMP Latest Ref Rng & Units 10/26/2018 10/23/2018 10/22/2018  Glucose 70 - 99 mg/dL 141(H) 124(H) 128(H)  BUN 8 - 23 mg/dL 25(H) 13 14  Creatinine 0.61 - 1.24 mg/dL 1.03 0.96 1.13  Sodium 135 - 145 mmol/L 137 137 140  Potassium 3.5 - 5.1 mmol/L 3.7 3.5 3.7  Chloride 98 - 111 mmol/L 97(L) 102 101  CO2 22 - 32 mmol/L _0 Calcium 8.9 - 10.3 mg/dL 8.9 8.6(L) 8.9  Total Protein 6.5 - 8.1 g/dL - - 7.2  Total Bilirubin 0.3 - 1.2 mg/dL - - 0.9  Alkaline Phos 38 - 126 U/L - - 117  AST 15 - 41 U/L - - 44(H)  ALT 0 - 44 U/L - - 37  Baseline PCT: 0.57  MEDICATIONS: I have reviewed all medications and confirmed regimen as documented CULTURE RESULTS   Recent Results (from the past 240 hour(s))  Blood culture (routine x 2)     Status: None  (Preliminary result)   Collection Time: 10/22/18 11:28 AM   Specimen: BLOOD  Result Value Ref Range Status   Specimen Description BLOOD LAC  Final   Special Requests   Final    BOTTLES DRAWN AEROBIC AND ANAEROBIC Blood Culture adequate volume   Culture   Final    NO GROWTH 4 DAYS Performed at [facility], [address] [place], [place] [address]    Report Status PENDING  Incomplete  Blood culture (routine x 2)     Status: None (Preliminary result)   Collection Time: 10/22/18 11:28 AM   Specimen: BLOOD  Result Value Ref Range Status   Specimen Description BLOOD R FA  Final   Special Requests   Final    BOTTLES DRAWN AEROBIC AND ANAEROBIC Blood Culture results may not be optimal due to an excessive volume of blood received in culture bottles   Culture   Final    NO GROWTH 4 DAYS Performed at [facility], [address], [place], [place] [address]    Report Status PENDING  Incomplete  SARS Coronavirus 2 [facility] order, Performed in [facility] hospital lab) Nasopharyngeal Nasopharyngeal Swab     Status: None   Collection Time: 10/22/18 11:28 AM   Specimen: Nasopharyngeal Swab  Result Value Ref Range Status   SARS Coronavirus 2 NEGATIVE NEGATIVE Final    Comment: (NOTE) If result is NEGATIVE SARS-CoV-2 target nucleic acids are NOT DETECTED. The SARS-CoV-2 RNA is generally detectable in upper and lower  respiratory specimens during the acute phase of infection. The lowest  concentration of SARS-CoV-2 viral copies this assay can detect is 250  copies / mL. A negative result does not preclude SARS-CoV-2 infection  and should not be used as the sole basis for treatment or other  patient management decisions.  A negative result may occur with  improper specimen collection / handling, submission of specimen other  than nasopharyngeal swab, presence of viral mutation(s) within the  areas targeted by this assay, and inadequate number of viral copies  (<250 copies  / mL). A negative result must be combined with clinical  observations, patient history, and epidemiological information. If result is POSITIVE SARS-CoV-2 target nucleic acids are DETECTED. The SARS-CoV-2 RNA is generally detectable in upper and lower  respiratory specimens dur ing the acute phase of infection.  Positive  results are indicative of active infection with SARS-CoV-2.  Clinical  correlation with patient history and other diagnostic information is  necessary to determine patient infection status.  Positive results do  not rule out bacterial infection or co-infection with other viruses. If result is PRESUMPTIVE POSTIVE SARS-CoV-2 nucleic acids MAY BE PRESENT.   A presumptive positive result was obtained on the submitted specimen  and confirmed on repeat testing.  While 2019 novel coronavirus  (SARS-CoV-2) nucleic acids may be present in the submitted sample  additional confirmatory testing may be necessary for epidemiological  and / or clinical management purposes  to differentiate between  SARS-CoV-2 and other Sarbecovirus currently known to infect humans.  If clinically indicated additional testing with an alternate test  methodology [phone]) is advised. The SARS-CoV-2 RNA is generally  detectable in upper and lower respiratory sp ecimens during the acute  phase of infection. The expected result is Negative. Fact Sheet for Patients:  StrictlyIdeas.no Fact Sheet for Healthcare Providers: BankingDealers.co.za This test is not yet approved or cleared by the [place] FDA and has been authorized for detection and/or diagnosis of SARS-CoV-2 by FDA under an Emergency Use Authorization (EUA).  This EUA will remain in effect (meaning this test can be used) for the duration of the COVID-19 declaration under Section 564(b)(1) of the Act, 21 U.S.C. section 360bbb-3(b)(1), unless the authorization is terminated or revoked sooner.  Performed at [facility], [address] [place], [place] [address]   Urine culture     Status: Abnormal   Collection Time: 10/22/18 12:08 PM   Specimen: Urine, Random  Result Value Ref Range Status   Specimen Description   Final    URINE, RANDOM Performed at [facility], [address], [place], [place] [address]    Special Requests   Final    NONE Performed at [facility], [address] [place], [place] [address]    Culture (A)  Final    <10,000 COLONIES/mL INSIGNIFICANT GROWTH Performed at [facility], [place] [address], [place],  [address]    Report Status 10/23/2018 FINAL  Final  SARS Coronavirus 2 [facility] order, Performed in [facility] hospital lab) Nasopharyngeal Nasopharyngeal Swab     Status: None   Collection Time: 10/25/18  4:19 PM   Specimen: Nasopharyngeal Swab  Result Value Ref Range Status   SARS Coronavirus 2 NEGATIVE NEGATIVE Final    Comment: (NOTE) If result is NEGATIVE SARS-CoV-2 target nucleic acids are NOT DETECTED. The SARS-CoV-2 RNA is generally detectable in upper and lower  respiratory specimens during the acute phase of infection. The lowest  concentration of SARS-CoV-2 viral copies this assay can detect is 250  copies / mL. A negative result does not preclude SARS-CoV-2 infection  and should not be used as the sole basis for treatment or other  patient management decisions.  A negative result may occur with  improper specimen collection / handling, submission of specimen other  than nasopharyngeal swab, presence of viral mutation(s) within the  areas targeted by this assay, and inadequate number of viral copies  (<250 copies / mL). A negative result must be combined with clinical  observations, patient history, and epidemiological information. If result is POSITIVE SARS-CoV-2 target nucleic acids are DETECTED. The SARS-CoV-2 RNA is generally detectable in upper and lower  respiratory specimens dur  ing the acute phase of infection.  Positive  results are indicative of active infection with SARS-CoV-2.  Clinical  correlation with patient history and other diagnostic information is  necessary to determine patient infection status.  Positive results do  not rule out bacterial infection  or co-infection with other viruses. If result is PRESUMPTIVE POSTIVE SARS-CoV-2 nucleic acids MAY BE PRESENT.   A presumptive positive result was obtained on the submitted specimen  and confirmed on repeat testing.  While 2019 novel coronavirus  (SARS-CoV-2) nucleic acids may be present in the submitted sample  additional confirmatory testing may be necessary for epidemiological  and / or clinical management purposes  to differentiate between  SARS-CoV-2 and other Sarbecovirus currently known to infect humans.  If clinically indicated additional testing with an alternate test  methodology [phone]) is advised. The SARS-CoV-2 RNA is generally  detectable in upper and lower respiratory sp ecimens during the acute  phase of infection. The expected result is Negative. Fact Sheet for Patients:  StrictlyIdeas.no Fact Sheet for Healthcare Providers: BankingDealers.co.za This test is not yet approved or cleared by the [place] FDA and has been authorized for detection and/or diagnosis of SARS-CoV-2 by FDA under an Emergency Use Authorization (EUA).  This EUA will remain in effect (meaning this test can be used) for the duration of the COVID-19 declaration under Section 564(b)(1) of the Act, 21 U.S.C. section 360bbb-3(b)(1), unless the authorization is terminated or revoked sooner. Performed at [facility], [address] [place], [place] [address]    IMAGING   CXR Independently reviewed-   Dg Chest 1 View  Result Date: 10/25/2018 CLINICAL DATA:  Shortness of breath EXAM: CHEST  1 VIEW COMPARISON:  CTA chest same day FINDINGS:  Redemonstration of multifocal areas of airspace disease throughout both lungs. There is diffuse pleural thickening with calcified pleural plaques in the bases and medially. No pneumothorax. Suspect trace effusions. Pacer/defibrillator battery pack in the soft tissues of the left chest wall with leads in the right atrium, cardiac apex and coronary sinus. Sternotomy wires noted in the mid chest. Additional pacer leads course towards the abdomen. IMPRESSION: Multifocal areas of airspace disease throughout both lungs, either multifocal pneumonia or diffuse alveolar edema. Calcified pleural plaques Cardiomegaly. Trace bilateral effusions. Electronically Signed   By: Lovena Le M.D.   On: 10/25/2018 21:05   External condom Urinary Catheter continued, requirement due to   Reason to continue Indwelling Urinary Catheter strict Intake/Output monitoring for hemodynamic instability    ASSESSMENT AND PLAN SYNOPSIS: 73 year old male with history as stated above seen I nthe ICU for further management of acute hypoxic respiratory failure, with bilateral pulmonary infiltrates, and chest trauma. Etiology of patient's respiratory distress remains unclear. Possibilities include infection, vs cardiogenic vs. Amiodarone induced pulmonary inflammation.    Severe ACUTE Hypoxic and Respiratory Failure - Will trial HFNC, with continued order for BiPAP PRN.  - IV methylprednisolone. - continue Bronchodilator Therapy  - BNP and ESR completed and pending.   Atrial fibrillation w/ ICD in place, and history of Vtach and HTN -  Patient on amiodarone for many years. Will consider trial off of amiodarone to assess for possible amiodarone induced pulmonary /pneumonnitis fibrosis.  - Continue mexiletine. - Continue Dabigatran, ASA, statin, Metoprolol - ICU monitoring  ID - Procalcitonin baseline elevated at 0.58.  - Blood cultures with no growth at 4 days. - continue IV abx as prescibed (cefepime and doxycycline) -Will  monitor fever curve.  Rib fractures - Continue analgesia.  GI GI PROPHYLAXIS as indicated  NUTRITIONAL STATUS DIET--> NPO on BiPAP Constipation protocol as indicated  ENDO - will use ICU hypoglycemic\Hyperglycemia protocol if needed  ELECTROLYTES -follow labs as needed -replace as needed -pharmacy consultation and following  DVT Prophylaxis with dabigatran TRANSFUSIONS  AS NEEDED MONITOR FSBS ASSESS the need for LABS   Dareen Piano  PA-s  PCCM ATTENDING ATTESTATION: I have evaluated patient with the PA student, reviewed database in its entirety and discussed care plan in detail. In addition, this patient was discussed on multidisciplinary rounds. I have personally reviewed all chest radiographs discussed herein including CXRs and CT chest unless otherwise indicated  His admission note and hospital records have been reviewed.  It was transferred to [place] this morning with worsening hypoxemic respiratory failure and respiratory distress.  He was placed on BiPAP.  Chest x-ray reveals patchy bilateral infiltrates.  CT of chest from 8/18 reveals patchy, predominantly interstitial infiltrates in both lungs.  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

## 2018-10-26 NOTE — Progress Notes (Addendum)
Pt constantly removing nasal canula. O2 sats drops to 60's with no oxygen. Frequently checking the pt.

## 2018-10-26 NOTE — Progress Notes (Signed)
Increased High Flow Nasal Cannula to  55L @ 100% FiO2 to maintain SpO2 greater than 90%  Pt. Continues to occasionally remove nasal cannula from nose. RN aware. Pt. Resting quietly. No distress noted at this time.  Will continue to monitor.

## 2018-10-26 NOTE — Consult Note (Signed)
Pharmacy Antibiotic Note  Darius Miller is a 73 y.o. male admitted on 10/22/2018 with pneumonia.  Pharmacy has been consulted for Cefepime dosing.  Plan: Cefepime 2g IV Q8 Hours  Height: 5\' 11"  (180.3 cm) Weight: 169 lb (76.7 kg) IBW/kg (Calculated) : 75.3  Temp (24hrs), Avg:98.4 F (36.9 C), Min:97.9 F (36.6 C), Max:99.4 F (37.4 C)  Recent Labs  Lab 10/22/18 1040 10/23/18 0422 10/26/18 0418  WBC 6.0 9.0  --   CREATININE 1.13 0.96 1.03  LATICACIDVEN 1.7  --   --     Estimated Creatinine Clearance: 69 mL/min (by C-G formula based on SCr of 1.03 mg/dL).    Allergies  Allergen Reactions  . Flomax [Tamsulosin Hcl]     Antimicrobials this admission: Cefepime 8/19 >> Azithromycin 8/15 >> 8/17 Rocephin 8/15 >> 8/17   Microbiology results: 8/15 BCx: NGTD   Thank you for allowing pharmacy to be a part of this patient's care.  Servando Kyllonen A Ronie Barnhart 10/26/2018 8:18 AM

## 2018-10-26 NOTE — Progress Notes (Signed)
Called spouse to alert of transfer to 2nd floor room 12

## 2018-10-26 NOTE — Progress Notes (Signed)
PT Cancellation Note  Patient Details Name: Tejas Seawood MRN: [id number] DOB: 1945/10/23   Cancelled Treatment:    Reason Eval/Treat Not Completed: Medical issues which prohibited therapy(Per chart review, patient noted with transfer to CCU due to decline in respiratory status.  Per guidelines, will require new orders to resume services. Please re-consult as medically appropriate)   Karder Goodin H. Owens Shark, PT, DPT, NCS 10/26/18, 10:48 PM [phone]

## 2018-10-26 NOTE — Progress Notes (Signed)
[employer] -  at [facility]   PATIENT NAME: Darius Miller    MR#:  [id number]  DATE OF BIRTH:  [birth year]  SUBJECTIVE:  Patient was not discharged last night due to increasing shortness of breath.  Patient is on high flow today with O2 saturations 90 to 91%.  Patient is confused.  REVIEW OF SYSTEMS:  Patient with confusion this morning But able to answer questions  Tolerating Diet: yes      DRUG ALLERGIES:   Allergies  Allergen Reactions  . Flomax [Tamsulosin Hcl]     VITALS:  Blood pressure 107/74, pulse 66, temperature 97.8 F (36.6 C), temperature source Axillary, resp. rate 18, height 5\' 11"  (1.803 m), weight 74 kg, SpO2 96 %.  PHYSICAL EXAMINATION:  Constitutional: Appears well-developed and well-nourished. No distress. HENT: Normocephalic. Marland Kitchen Oropharynx is clear and moist.  Eyes: Conjunctivae and EOM are normal. PERRLA, no scleral icterus.  Neck: Normal ROM. Neck supple. No JVD. No tracheal deviation. CVS: RRR, S1/S2 +, no murmurs, no gallops, no carotid bruit.  Pulmonary: Decreased breath sounds throughout lung fields.  Abdominal: Soft. BS +,  no distension, tenderness, rebound or guarding.  Musculoskeletal: Normal range of motion. No edema and no tenderness.  Neuro: Alert. CN 2-12 grossly intact. No focal deficits. Skin: Skin is warm and dry. No rash noted. Psychiatric: Confused    LABORATORY PANEL:   CBC Recent Labs  Lab 10/23/18 0422  WBC 9.0  HGB 12.3*  HCT 38.6*  PLT 166   ------------------------------------------------------------------------------------------------------------------  Chemistries  Recent Labs  Lab 10/22/18 1040  10/26/18 0418  NA 140   < > 137  K 3.7   < > 3.7  CL 101   < > 97*  CO2 30   < > 30  GLUCOSE 128*   < > 141*  BUN 14   < > 25*  CREATININE 1.13   < > 1.03  CALCIUM 8.9   < > 8.9  AST 44*  --   --   ALT 37  --   --   ALKPHOS 117  --   --   BILITOT 0.9  --   --    < > = values in  this interval not displayed.   ------------------------------------------------------------------------------------------------------------------  Cardiac Enzymes No results for input(s): TROPONINI in the last 168 hours. ------------------------------------------------------------------------------------------------------------------  RADIOLOGY:     ASSESSMENT AND PLAN:   73 year old male with PAF and CAD who presented after mechanical fall and suffered rib fracture.  1. . Acute respiratory failure, hypoxic: This is due to combination of atelectasis from rib fractures and pneumonia from atelectasis.  I will change antibiotics to cefepime.  Patient will be transferred to stepdown.  Case discussed with Dr. Sung Amabile Patient may need BiPAP CT chest yesterday showed no evidence of PE however does show pneumonia Less likely pulmonary edema Repeat cover testing negative   2..  Rib fractures: Patient is being treated conservatively.  Rib fractures are status post mechanical fall.  CT head and CT C-spine were negative for acute fracture. Continue incentive spirometer.  3.  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  CAD status post stents: Continue aspirin, metoprolol, and Lipitor.  5.  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  COPD without signs of exacerbation: Continue inhalers  7.  Essential hypertension: Patient will continue on metoprolol      Management  plans discussed with the patient and he is in agreement.  CODE STATUS: full  Critical care TOTAL TIME TAKING CARE OF THIS PATIENT: 28 minutes.     POSSIBLE D/C 3 days    Bettey Costa M.D on 10/24/2018 at 12:18 PM  Between 7am to 6pm - Pager - [phone] After 6pm go to [email or web address] - password [email or web address] [place]  Office  [phone]  CC: Primary care  physician; Baxter Hire, MD  Note: This dictation was prepared with Dragon dictation along with smaller phrase technology. Any transcriptional errors that result from this process are unintentional.

## 2018-10-27 ENCOUNTER — Inpatient Hospital Stay: Payer: Medicare HMO

## 2018-10-27 DIAGNOSIS — I4821 Permanent atrial fibrillation: Secondary | ICD-10-CM

## 2018-10-27 DIAGNOSIS — I472 Ventricular tachycardia: Secondary | ICD-10-CM

## 2018-10-27 DIAGNOSIS — R509 Fever, unspecified: Secondary | ICD-10-CM

## 2018-10-27 DIAGNOSIS — Z95810 Presence of automatic (implantable) cardiac defibrillator: Secondary | ICD-10-CM

## 2018-10-27 LAB — BASIC METABOLIC PANEL
Anion gap: 4 — ABNORMAL LOW (ref 5–15)
BUN: 32 mg/dL — ABNORMAL HIGH (ref 8–23)
CO2: 31 mmol/L (ref 22–32)
Calcium: 8.8 mg/dL — ABNORMAL LOW (ref 8.9–10.3)
Chloride: 104 mmol/L (ref 98–111)
Creatinine, Ser: 0.88 mg/dL (ref 0.61–1.24)
GFR calc Af Amer: >60 mL/min (ref >60)
GFR calc non Af Amer: >60 mL/min (ref >60)
Glucose, Bld: 154 mg/dL — ABNORMAL HIGH (ref 70–99)
Potassium: 4.1 mmol/L (ref 3.5–5.1)
Sodium: 139 mmol/L (ref 135–145)

## 2018-10-27 LAB — CULTURE, BLOOD (ROUTINE X 2)
Culture: NO GROWTH
Culture: NO GROWTH
Special Requests: ADEQUATE

## 2018-10-27 LAB — CBC
HCT: 34.8 % — ABNORMAL LOW (ref 39.0–52.0)
Hemoglobin: 11.3 g/dL — ABNORMAL LOW (ref 13.0–17.0)
MCH: 30.2 pg (ref 26.0–34.0)
MCHC: 32.5 g/dL (ref 30.0–36.0)
MCV: 93 fL (ref 80.0–100.0)
Platelets: 213 10*3/uL (ref 150–400)
RBC: 3.74 MIL/uL — ABNORMAL LOW (ref 4.22–5.81)
RDW: 14.5 % (ref 11.5–15.5)
WBC: 8.8 10*3/uL (ref 4.0–10.5)
nRBC: 0 % (ref 0.0–0.2)

## 2018-10-27 LAB — PROCALCITONIN: Procalcitonin: 0.63 ng/mL

## 2018-10-27 MED ORDER — METHYLPREDNISOLONE SODIUM SUCC 40 MG IJ SOLR
40.0000 mg | Freq: Every day | INTRAMUSCULAR | Status: DC
  Administered 2018-10-28 – 2018-10-29 (×2): 40 mg via INTRAVENOUS
  Filled 2018-10-27 (×2): qty 1

## 2018-10-27 MED ORDER — HALOPERIDOL LACTATE 5 MG/ML IJ SOLN
2.0000 mg | Freq: Four times a day (QID) | INTRAMUSCULAR | Status: DC | PRN
  Administered 2018-10-27 – 2018-10-30 (×3): 2 mg via INTRAVENOUS
  Filled 2018-10-27 (×3): qty 1

## 2018-10-27 MED ORDER — HALOPERIDOL LACTATE 5 MG/ML IJ SOLN
INTRAMUSCULAR | Status: AC
  Filled 2018-10-27: qty 1

## 2018-10-27 MED ORDER — HALOPERIDOL LACTATE 5 MG/ML IJ SOLN
2.0000 mg | Freq: Once | INTRAMUSCULAR | Status: AC
  Administered 2018-10-27: 2 mg via INTRAVENOUS
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
## 2018-10-27 NOTE — Progress Notes (Signed)
Pharmacy Antibiotic Note  Darius Miller is a 73 y.o. male admitted on 10/22/2018 with pneumonia. Patient initially given ceftriaxone and azithromycin at the ED. 8/15 EKG showed QTc = 510 ms. Azithromycin transitioned to doxycyline on 8/19. Ceftriaxone transitioned to cefepime on 8/19.  WBC is normal and patient is afebrile, but PCT is increasing (currently 0.63). Pharmacy has been consulted for Cefepime dosing.  Plan: Continue cefepime 2g IV Q8H. Continue doxycycline 100 mg Q12H.   Height: 5\' 11"  (180.3 cm) Weight: 163 lb 2.3 oz (74 kg) IBW/kg (Calculated) : 75.3  Temp (24hrs), Avg:97.9 F (36.6 C), Min:97.6 F (36.4 C), Max:98.2 F (36.8 C)  Recent Labs  Lab 10/22/18 1040 10/23/18 0422 10/26/18 0418 10/27/18 0434  WBC 6.0 9.0  --  8.8  CREATININE 1.13 0.96 1.03 0.88  LATICACIDVEN 1.7  --   --   --     Estimated Creatinine Clearance: 79.4 mL/min (by C-G formula based on SCr of 0.88 mg/dL).    Allergies  Allergen Reactions  . Flomax [Tamsulosin Hcl]     Antimicrobials this admission: azithromycin 8/15 >> 8/17 ceftriaxone 8/15 >> 8/17 cefepime 8/19 >> doxycycline 8/19 >>  Microbiology results: 08/15 BCx: no growth  08/15 UCx: insignificant growth  Thank you for allowing pharmacy to be a part of this patient's care.  Darius Miller Dear [name] 10/27/2018 12:28 PM

## 2018-10-27 NOTE — Progress Notes (Addendum)
Name: Darius FreshwaterJames W. Zeiser MRN: [id number] DOB: 1945-05-18    BRIEF PATIENT DESCRIPTION: Pt admitted 08/15 s/p fall resulting in a rib fracture transferred to ICU 08/19 with worsening hypoxemic respiratory failure and respiratory distress.  He was placed on BiPAP.  Chest x-ray reveals patchy bilateral infiltrates.  CT of chest from 8/18 reveals patchy, predominantly interstitial infiltrates in both lungs.    STUDIES/EVENTS 10/22/2018: ED s/p fall. 10/23/2018: Continued to be febrile. Required O2 via [place]. CSW with recommendation to transfer to SNF - [facility] 10/25/2018: Plan was to discharge to [facility] today. He was hypoxic (60's on 2L oxygen). Started on high flow. 1 dose Lasix given. [facility] came to pick him up. Patient at that time was on 10 L high flow with SpO2 @ 86%. Kept in hospital.  BNP baseline 346.0. CXR completed --> stable cardiomegaly, bronchovascular crowding and vascular congestion, and bilateral calcified pleural plaques.  10/26/18: On High Flow - to 55L @ 100% FiO2. Started on Cefepime.  9 AM patient on HFNC 55 lpm @ 100% - with O2 saturation in the high 80's, and was lethargic. Patient admitted to ICU for further management.  PAST MEDICAL HISTORY :   has a past medical history of Atrial fibrillation (HCC), Coronary artery disease  s/p RCA stents, History of mitral valve repair, Hypertension, ICD (implantable cardioverter-defibrillator), biventricular, Medtronic NO  atrial lead, Kidney stones, Stroke [facility](HCC), and Ventricular tachycardia (HCC).  has a past surgical history that includes Mitral valve repair (October 16, 2014); Cardiac catheterization; Coronary angioplasty; Coronary stent 2009 (2009); and ICD IMPLANT.  SUBJECTIVE Pt currently on 100% HFNC with O2 sats 84-92% states he is slightly short of breath   VITAL SIGNS: Temp:  [97.6 F (36.4 C)-98.2 F (36.8 C)] 98.2 F (36.8 C) (08/20 0200) Pulse Rate:  [59-92] 61 (08/20 1000) Resp:  [19-32] 27 (08/20 1000)  BP: (79-125)/(57-103) 95/66 (08/20 1000) SpO2:  [81 %-98 %] 88 % (08/20 1000) FiO2 (%):  [80 %-100 %] 100 % (08/20 0935)  I/O last 3 completed shifts: In: 1074.1 [I.V.:240.4; IV Piggyback:833.8] Out: 1950 [Urine:1950] No intake/output data recorded.  SpO2: (!) 88 % O2 Flow Rate (L/min): 55 L/min FiO2 (%): 100 %  Physical Examination:  General: well developed, well nourished male, NAD  Neuro: alert disoriented to time, follows commands  Cardiovascular: nsr, rrr, no R/G  Lungs: RLL rhonchi, diminished all other lobes, non labored Abdomen: +BS x4, soft, non tender, non distended  Musculoskeletal: normal bulk and tone, no edema  Skin: intact no rashes or lesions present   CBC Latest Ref Rng & Units 10/27/2018 10/23/2018 10/22/2018  WBC 4.0 - 10.5 K/uL 8.8 9.0 6.0  Hemoglobin 13.0 - 17.0 g/dL 11.3(L) 12.3(L) 14.5  Hematocrit 39.0 - 52.0 % 34.8(L) 38.6(L) 45.7  Platelets 150 - 400 K/uL 213 166 180   CMP Latest Ref Rng & Units 10/27/2018 10/26/2018 10/23/2018  Glucose 70 - 99 mg/dL 213(Y154(H) 865(H141(H) 846(N124(H)  BUN 8 - 23 mg/dL 62(X32(H) 52(W25(H) 13  Creatinine 0.61 - 1.24 mg/dL 4.130.88 2.441.03 0.100.96  Sodium 135 - 145 mmol/L 139 137 137  Potassium 3.5 - 5.1 mmol/L 4.1 3.7 3.5  Chloride 98 - 111 mmol/L 104 97(L) 102  CO2 22 - 32 mmol/L 31 30 29   Calcium 8.9 - 10.3 mg/dL 2.7(O8.8(L) 8.9 5.3(G8.6(L)  Total Protein 6.5 - 8.1 g/dL - - -  Total Bilirubin 0.3 - 1.2 mg/dL - - -  Alkaline Phos 38 - 126 U/L - - -  AST  15 - 41 U/L - - -  ALT 0 - 44 U/L - - -  Baseline PCT: 0.57  MEDICATIONS: I have reviewed all medications and confirmed regimen as documented CULTURE RESULTS   Recent Results (from the past 240 hour(s))  Blood culture (routine x 2)     Status: None   Collection Time: 10/22/18 11:28 AM   Specimen: BLOOD  Result Value Ref Range Status   Specimen Description BLOOD LAC  Final   Special Requests   Final    BOTTLES DRAWN AEROBIC AND ANAEROBIC Blood Culture adequate volume   Culture   Final    NO GROWTH 5  DAYS Performed at [facility], [address] [place], [place] [address]    Report Status 10/27/2018 FINAL  Final  Blood culture (routine x 2)     Status: None   Collection Time: 10/22/18 11:28 AM   Specimen: BLOOD  Result Value Ref Range Status   Specimen Description BLOOD R FA  Final   Special Requests   Final    BOTTLES DRAWN AEROBIC AND ANAEROBIC Blood Culture results may not be optimal due to an excessive volume of blood received in culture bottles   Culture   Final    NO GROWTH 5 DAYS Performed at [facility], [address] [place], [place] [address]    Report Status 10/27/2018 FINAL  Final  SARS Coronavirus 2 [facility] order, Performed in [place] hospital lab) Nasopharyngeal Nasopharyngeal Swab     Status: None   Collection Time: 10/22/18 11:28 AM   Specimen: Nasopharyngeal Swab  Result Value Ref Range Status   SARS Coronavirus 2 NEGATIVE NEGATIVE Final    Comment: (NOTE) If result is NEGATIVE SARS-CoV-2 target nucleic acids are NOT DETECTED. The SARS-CoV-2 RNA is generally detectable in upper and lower  respiratory specimens during the acute phase of infection. The lowest  concentration of SARS-CoV-2 viral copies this assay can detect is 250  copies / mL. A negative result does not preclude SARS-CoV-2 infection  and should not be used as the sole basis for treatment or other  patient management decisions.  A negative result may occur with  improper specimen collection / handling, submission of specimen other  than nasopharyngeal swab, presence of viral mutation(s) within the  areas targeted by this assay, and inadequate number of viral copies  (<250 copies / mL). A negative result must be combined with clinical  observations, patient history, and epidemiological information. If result is POSITIVE SARS-CoV-2 target nucleic acids are DETECTED. The SARS-CoV-2 RNA is generally detectable in upper and lower  respiratory specimens dur ing the  acute phase of infection.  Positive  results are indicative of active infection with SARS-CoV-2.  Clinical  correlation with patient history and other diagnostic information is  necessary to determine patient infection status.  Positive results do  not rule out bacterial infection or co-infection with other viruses. If result is PRESUMPTIVE POSTIVE SARS-CoV-2 nucleic acids MAY BE PRESENT.   A presumptive positive result was obtained on the submitted specimen  and confirmed on repeat testing.  While 2019 novel coronavirus  (SARS-CoV-2) nucleic acids may be present in the submitted sample  additional confirmatory testing may be necessary for epidemiological  and / or clinical management purposes  to differentiate between  SARS-CoV-2 and other Sarbecovirus currently known to infect humans.  If clinically indicated additional testing with an alternate test  methodology [phone]) is advised. The SARS-CoV-2 RNA is generally  detectable in upper and  lower respiratory sp ecimens during the acute  phase of infection. The expected result is Negative. Fact Sheet for Patients:  BoilerBrush.com.cy Fact Sheet for Healthcare Providers: https://pope.com/ This test is not yet approved or cleared by the [place] FDA and has been authorized for detection and/or diagnosis of SARS-CoV-2 by FDA under an Emergency Use Authorization (EUA).  This EUA will remain in effect (meaning this test can be used) for the duration of the COVID-19 declaration under Section 564(b)(1) of the Act, 21 U.S.C. section 360bbb-3(b)(1), unless the authorization is terminated or revoked sooner. Performed at [facility], [address], [place], [place] [address]   Urine culture     Status: Abnormal   Collection Time: 10/22/18 12:08 PM   Specimen: Urine, Random  Result Value Ref Range Status   Specimen Description   Final    URINE, RANDOM Performed at [facility], [address], [place], [place] [address]    Special Requests   Final    NONE Performed at [facility], [address] [place], [place] [address]    Culture (A)  Final    <10,000 COLONIES/mL INSIGNIFICANT GROWTH Performed at [facility], [address] [place], [place] [address]    Report Status 10/23/2018 FINAL  Final  SARS Coronavirus 2 [facility] order, Performed in [facility] hospital lab) Nasopharyngeal Nasopharyngeal Swab     Status: None   Collection Time: 10/25/18  4:19 PM   Specimen: Nasopharyngeal Swab  Result Value Ref Range Status   SARS Coronavirus 2 NEGATIVE NEGATIVE Final    Comment: (NOTE) If result is NEGATIVE SARS-CoV-2 target nucleic acids are NOT DETECTED. The SARS-CoV-2 RNA is generally detectable in upper and lower  respiratory specimens during the acute phase of infection. The lowest  concentration of SARS-CoV-2 viral copies this assay can detect is 250  copies / mL. A negative result does not preclude SARS-CoV-2 infection  and should not be used as the sole basis for treatment or other  patient management decisions.  A negative result may occur with  improper specimen collection / handling, submission of specimen other  than nasopharyngeal swab, presence of viral mutation(s) within the  areas targeted by this assay, and inadequate number of viral copies  (<250 copies / mL). A negative result must be combined with clinical  observations, patient history, and epidemiological information. If result is POSITIVE SARS-CoV-2 target nucleic acids are DETECTED. The SARS-CoV-2 RNA is generally detectable in upper and lower  respiratory specimens dur ing the acute phase of infection.  Positive  results are indicative of active infection with SARS-CoV-2.  Clinical  correlation with patient history and other diagnostic information is  necessary to determine patient infection status.  Positive results do  not rule out bacterial  infection or co-infection with other viruses. If result is PRESUMPTIVE POSTIVE SARS-CoV-2 nucleic acids MAY BE PRESENT.   A presumptive positive result was obtained on the submitted specimen  and confirmed on repeat testing.  While 2019 novel coronavirus  (SARS-CoV-2) nucleic acids may be present in the submitted sample  additional confirmatory testing may be necessary for epidemiological  and / or clinical management purposes  to differentiate between  SARS-CoV-2 and other Sarbecovirus currently known to infect humans.  If clinically indicated additional testing with an alternate test  methodology [phone]) is advised. The SARS-CoV-2 RNA is generally  detectable in upper and lower respiratory sp ecimens during the acute  phase of infection. The expected result is Negative.  Fact Sheet for Patients:  BoilerBrush.com.cyhttps://www.fda.gov/media/136312/download Fact Sheet for Healthcare Providers: https://pope.com/https://www.fda.gov/media/136313/download This test is not yet approved or cleared by the [place] FDA and has been authorized for detection and/or diagnosis of SARS-CoV-2 by FDA under an Emergency Use Authorization (EUA).  This EUA will remain in effect (meaning this test can be used) for the duration of the COVID-19 declaration under Section 564(b)(1) of the Act, 21 U.S.C. section 360bbb-3(b)(1), unless the authorization is terminated or revoked sooner. Performed at [facility], [address] [place], [place] [address]    IMAGING   CXR Independently reviewed-   Dg Chest Port 1 View  Result Date: 10/27/2018 CLINICAL DATA:  Respiratory failure EXAM: PORTABLE CHEST 1 VIEW COMPARISON:  10/25/2018 FINDINGS: Cardiac shadow is enlarged but stable. Defibrillator is again noted. Patchy infiltrates are again identified bilaterally. Calcified pleural plaques are noted as well. The overall appearance is stable from the prior study. No new focal abnormality is noted. IMPRESSION: Stable bilateral  infiltrates. Chronic calcified pleural plaquing. Electronically Signed   By: Alcide CleverMark  Lukens M.D.   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 Bilateral pulmonary infiltrates   DDx includes atypical infections (febrile on admission) +/- pulmonary edema (mildly elevated BNP) +/- noninfectious pneumonitis (on amiodarone)  PLAN/REC: Continue BiPAP as needed Continue supplemental oxygen to maintain SPO2 >90% Diuresis as permitted by BP and renal function Hold amiodarone for now (outpatient cardiologist aware) Decrease methylprednisolone 40 mg daily due to acute delirium  Continue cefepime and doxycycline  Follow CXR  Sonda Rumbleana Blakeney, AGNP  Pulmonary/Critical Care Pager [phone] (please enter 7 digits) PCCM Consult Pager [phone] (please enter 7 digits)   PCCM ATTENDING ATTESTATION: I have evaluated patient with the APP Blakeney, reviewed database in its entirety and discussed care plan in detail. In addition, this patient was discussed on multidisciplinary rounds. I have personally reviewed all chest radiographs discussed herein including CXRs and CT chest unless otherwise indicated  Important exam findings: No overt distress but still requiring BiPAP<> HFNC Mildly confused HEENT WNL Neck supple without JVD noted Bibasilar crackles, no wheezes Regular, no M Abdomen soft, NT, + BS Extremities warm, no edema No focal neurologic deficits  CXR: NSC B interstitial opacities  Major problems addressed by PCCM team: - Acute hypoxemic respiratory failure - History of asbestos related pleural disease - Bilateral interstitial pulmonary infiltrates of unclear etiology - DDx includes atypical infections (febrile on  admission) +/- pulmonary edema (mildly elevated BNP) +/- noninfectious pneumonitis (on amiodarone) - Mild acute encephalopathy -possible due to systemic steroids  PLAN/REC: Continue supplemental oxygen Continue BiPAP as needed Continue empiric antibiotics Diurese to extent permitted by BP and renal function Continue methylprednisolone (dose reduced 8/20) Holding amiodarone for now (discussed with Dr. Graciela HusbandsKlein) Per my discussion 8/19: If intubated, should be for short-term only   Billy Fischeravid , MD PCCM service Mobile [phone] Pager [phone] 10/27/2018 1:56 PM

## 2018-10-27 NOTE — Consult Note (Signed)
ELECTROPHYSIOLOGY CONSULT NOTE  Patient ID: Darius Miller, MRN: [id number], DOB/AGE: 1945/05/17 73 y.o. Admit date: 10/22/2018 Date of Consult: 10/27/2018  Primary Physician: Darius Hire, MD Primary Cardiologist:  Darius Miller is a 73 y.o. male who is being seen today for the evaluation of VT and ?amio lung toxicity at the request of DR Alva Garnet.   Chief Complaint: intubated    HPI Darius Miller is a 73 y.o. 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, but now with concern for amio lung toxicity-- acute pneumonitis  Appears incorrectly dtaking amio   Last note [facility] 4/20 Amio 200 bid, but was taking 400 bid on arrival  anticoagulation with dabigitran   11/19 Echo 55%  LAE severe   Past Medical History:  Diagnosis Date   Atrial fibrillation (Darius Miller)    Coronary artery disease  s/p RCA stents    History of mitral valve repair    Hypertension    ICD (implantable cardioverter-defibrillator), biventricular, Medtronic NO  atrial lead    Kidney stones    Stroke [facility])    Ventricular tachycardia [facility])       Surgical History:  Past Surgical History:  Procedure Laterality Date   CARDIAC CATHETERIZATION     CORONARY ANGIOPLASTY     Coronary stent 2009  2009   ICD IMPLANT     MITRAL VALVE REPAIR  October 16, 2014     Home Meds: Prior to Admission medications   Medication Sig Start Date End Date Taking? Authorizing Provider  acetaminophen (TYLENOL) 500 MG tablet Take 1 tablet  by mouth every 8 (eight) hours as needed.   Yes [provider]  amiodarone (PACERONE) 400 MG tablet Take 1 tablet (400 mg total) by mouth 2 (two) times daily. 01/22/18  Yes Bettey Costa, MD  aspirin 81 MG tablet Take 1 tablet (81 mg total) by mouth daily. 06/04/17  Yes Tukov-Yual, Magdalene S, NP  atorvastatin (LIPITOR) 20 MG tablet Take 20 mg by mouth daily. 12/03/17  Yes [provider]  dabigatran (PRADAXA) 150 MG CAPS capsule Take 1 capsule (150 mg total) by mouth 2 (two) times daily. 06/04/17  Yes Tukov-Yual, Arlyss Gandy, NP  furosemide (LASIX) 40 MG tablet Take 1 tablet (40 mg total) by mouth daily. 06/04/17  Yes Tukov-Yual, Magdalene S, NP  magnesium oxide (MAG-OX) 400 MG tablet Take 1 tablet by mouth 2 (two) times daily. 11/08/17  Yes [provider]  metoprolol succinate (TOPROL-XL) 25 MG 24 hr tablet Take 0.5 tablets (12.5 mg total) by mouth 2 (two) times daily. 01/22/18  Yes Mody, Ulice Bold, MD  mexiletine (MEXITIL) 200 MG capsule Take 1 capsule (200 mg total) by mouth every 12 (twelve) hours. 01/22/18  Yes Mody, Ulice Bold, MD  nitroGLYCERIN (NITROSTAT) 0.4 MG SL tablet Place 1 tablet (0.4 mg total) under the tongue every 5 (five) minutes as needed for chest pain. 06/04/17  Yes Tukov-Yual, Magdalene  S, NP  TRELEGY ELLIPTA 100-62.5-25 MCG/INH AEPB INHALE 1 PUFF INTO THE LUNGS ONCE DAILY 09/01/18  Yes [provider]  venlafaxine XR (EFFEXOR-XR) 75 MG 24 hr capsule Take 75 mg by mouth daily.  09/23/18  Yes [provider]  cefdinir (OMNICEF) 300 MG capsule Take 1 capsule (300 mg total) by mouth 2 (two) times daily for 4 days. 10/25/18 10/29/18  Darius Saran, MD  oxyCODONE (OXY IR/ROXICODONE) 5 MG immediate release tablet Take 1 tablet (5 mg total) by mouth every 4 (four) hours as needed for moderate pain. 10/23/18   Darius Saran, MD    Inpatient Medications:   aspirin EC  81 mg Oral Daily   atorvastatin  20 mg Oral Daily   bacitracin   Topical Daily    dabigatran  150 mg Oral BID   furosemide  40 mg Oral Daily   haloperidol lactate       methylPREDNISolone (SOLU-MEDROL) injection  40 mg Intravenous Q12H   metoprolol succinate  12.5 mg Oral BID   mexiletine  200 mg Oral Q12H   sodium phosphate  1 enema Rectal Once   venlafaxine XR  75 mg Oral Daily      Allergies:  Allergies  Allergen Reactions   Flomax [Tamsulosin Hcl]     Social History   Socioeconomic History   Marital status: Single    Spouse name: Not on file   Number of children: Not on file   Years of education: Not on file   Highest education level: Not on file  Occupational History   Not on file  Social Needs   Financial resource strain: Not on file   Food insecurity    Worry: Not on file    Inability: Not on file   Transportation needs    Medical: Not on file    Non-medical: Not on file  Tobacco Use   Smoking status: Former Smoker    Packs/day: 2.00    Years: 20.00    Pack years: 40.00    Types: Cigarettes   Smokeless tobacco: Never Used  Substance and Sexual Activity   Alcohol use: Not on file   Drug use: Not on file   Sexual activity: Not on file  Lifestyle   Physical activity    Days per week: Not on file    Minutes per session: Not on file   Stress: Not on file  Relationships   Social connections    Talks on phone: Not on file    Gets together: Not on file    Attends religious service: Not on file    Active member of club or organization: Not on file    Attends meetings of clubs or organizations: Not on file    Relationship status: Not on file   Intimate partner violence    Fear of current or ex partner: Not on file    Emotionally abused: Not on file    Physically abused: Not on file    Forced sexual activity: Not on file  Other Topics Concern   Not on file  Social History Narrative   Not on file     History reviewed. No pertinent family history.   ROS:  Please see the history of present illness.     All  other systems reviewed and negative.    Physical Exam:  Blood pressure 103/71, pulse 61, temperature 98.2 F (36.8 C), temperature source Axillary, resp. rate (!) 32, height 5\' 11"  (1.803 m), weight 74 kg, SpO2  93 %. General: Well developed, well nourished male on bipap and sedated Head: Normocephalic, atraumatic, sclera non-icteric, no xanthomas, nares are without discharge. EENT: normal Lymph Nodes:  none Back: not examined Neck not examined Lungs: Clear  laterally   Breathing on BiPAP Heart: IRRR with 2/6 sys m,  Abdomen: Soft  Msk:  sedateed Extremities: No clubbing or cyanosis. No * edema.    Skin: Warm and Dry Neuro: Psych:  Sedated and non responsive t.      Labs: Cardiac Enzymes No results for input(s): CKTOTAL, CKMB, TROPONINI in the last 72 hours. CBC Lab Results  Component Value Date   WBC 8.8 10/27/2018   HGB 11.3 (L) 10/27/2018   HCT 34.8 (L) 10/27/2018   MCV 93.0 10/27/2018   PLT 213 10/27/2018   PROTIME: No results for input(s): LABPROT, INR in the last 72 hours. Chemistry  Recent Labs  Lab 10/22/18 1040  10/27/18 0434  NA 140   < > 139  K 3.7   < > 4.1  CL 101   < > 104  CO2 30   < > 31  BUN 14   < > 32*  CREATININE 1.13   < > 0.88  CALCIUM 8.9   < > 8.8*  PROT 7.2  --   --   BILITOT 0.9  --   --   ALKPHOS 117  --   --   ALT 37  --   --   AST 44*  --   --   GLUCOSE 128*   < > 154*   < > = values in this interval not displayed.   Lipids Lab Results  Component Value Date   CHOL 216 (H) 06/04/2017   HDL 24 (L) 06/04/2017   LDLCALC 166 (H) 06/04/2017   TRIG 130 06/04/2017   BNP No results found for: PROBNP Thyroid Function Tests: No results for input(s): TSH, T4TOTAL, T3FREE, THYROIDAB in the last 72 hours.  Invalid input(s): FREET3    Miscellaneous No results found for: DDIMER  Radiology/Studies:  Dg Chest 1 View  Result Date: 10/25/2018 CLINICAL DATA:  Shortness of breath EXAM: CHEST  1 VIEW COMPARISON:  CTA chest same day  FINDINGS: Redemonstration of multifocal areas of airspace disease throughout both lungs. There is diffuse pleural thickening with calcified pleural plaques in the bases and medially. No pneumothorax. Suspect trace effusions. Pacer/defibrillator battery pack in the soft tissues of the left chest wall with leads in the right atrium, cardiac apex and coronary sinus. Sternotomy wires noted in the mid chest. Additional pacer leads course towards the abdomen. IMPRESSION: Multifocal areas of airspace disease throughout both lungs, either multifocal pneumonia or diffuse alveolar edema. Calcified pleural plaques Cardiomegaly. Trace bilateral effusions. Electronically Signed   By: Kreg ShropshirePrice  DeHay M.D.   On: 10/25/2018 21:05   Dg Chest 1 View  Result Date: 10/25/2018 CLINICAL DATA:  Congestive heart failure EXAM: CHEST  1 VIEW COMPARISON:  October 25, 2018 study obtained earlier in the day FINDINGS: Areas of calcified pleural plaque are again noted. There is cardiomegaly with pulmonary vascular congestion. There is a small left pleural effusion. No frank consolidation. Pacemaker present with leads attached to right ventricle and coronary sinus. Abandoned pacemaker leads from abdominal probes noted, stable. There is aortic atherosclerosis. No adenopathy. There is degenerative change in each shoulder. IMPRESSION: No appreciable change from earlier in the day. Cardiomegaly with pulmonary vascular congestion. Small left pleural effusion. No consolidation. There is felt to be a degree of interstitial  edema. Multiple pleural plaques again noted. Aortic Atherosclerosis (ICD10-I70.0). Electronically Signed   By: Bretta BangWilliam  Woodruff III M.D.   On: 10/25/2018 08:21   Dg Ribs Unilateral W/chest Right  Result Date: 10/22/2018 CLINICAL DATA:  73 year old male with right lateral rib pain after falling EXAM: RIGHT RIBS AND CHEST - 3+ VIEW COMPARISON:  Most recent prior chest x-ray 01/18/2018 FINDINGS: Acute nondisplaced fracture of the  anterolateral aspect of the right second rib. Slight angulation of the anterolateral aspect of the right third in lieu rib is also evident and favored to represent an additional nondisplaced fracture. Left subclavian approach biventricular cardiac rhythm maintenance device. Leads project over the right ventricle and overlying the left heart. Stable cardiomegaly. Atherosclerotic calcifications in the aorta. 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 No pneumothorax or pleural effusion. Stable appearance of the lungs. IMPRESSION: 1. Acute nondisplaced fracture of the anterolateral aspect of the right second rib. 2. Also suspect a nondisplaced fracture of the anterolateral aspect of the right third rib. 3. No evidence of underlying pneumothorax or pleural effusion. 4. Otherwise, stable chest x-ray with cardiomegaly, pulmonary vascular congestion and bilateral calcified pleural plaques. Electronically Signed   By: Malachy MoanHeath  McCullough M.D.   On: 10/22/2018 13:44   Ct Head Wo Contrast  Result Date: 10/22/2018 CLINICAL DATA:  Pt fell while walking dog. Pt states he does not remember why he fell, denies LOC. Abrasions to forehead, back of head, bridge of noseC-spine trauma, high clinical risk (NEXUS/CCR); Head trauma, minor, GCS>=13, high clinical risk, initial exam EXAM: CT HEAD WITHOUT CONTRAST CT CERVICAL SPINE WITHOUT CONTRAST TECHNIQUE: Multidetector CT imaging of the head and cervical spine was performed following the standard protocol without intravenous contrast. Multiplanar CT image reconstructions of the cervical spine were also generated. COMPARISON:  None. FINDINGS: CT HEAD FINDINGS Brain: No intracranial hemorrhage. No parenchymal contusion. No midline shift or mass effect. Basilar cisterns are patent. No skull base fracture. No fluid in the paranasal sinuses or mastoid air cells. Orbits are normal. Mild periventricular white matter hypodensities. Vascular: No hyperdense  vessel or unexpected calcification. Skull: Normal. Negative for fracture or focal lesion. Sinuses/Orbits: Paranasal sinuses and mastoid air cells are clear. Orbits are clear. Other: None. CT CERVICAL SPINE FINDINGS Alignment: Normal alignment of the cervical vertebral bodies. Skull base and vertebrae: Normal craniocervical junction. No loss of vertebral body height or disc height. Normal facet articulation. No evidence of fracture. Soft tissues and spinal canal: No prevertebral soft tissue swelling. No perispinal or epidural hematoma. Disc levels: Mild endplate spurring and joint space narrowing at C5-C7. No subluxation. Upper chest: Clear Other: None IMPRESSION: 1. No intracranial trauma. 2. Mild white matter micro vascular disease. 3. No cervical spine fracture. Electronically Signed   By: Genevive BiStewart  Edmunds M.D.   On: 10/22/2018 11:48   Ct Angio Chest Pe W Or Wo Contrast  Result Date: 10/25/2018 CLINICAL DATA:  Shortness of breath.  Abnormal chest x-ray EXAM: CT ANGIOGRAPHY CHEST WITH CONTRAST TECHNIQUE: Multidetector CT imaging of the chest was performed using the standard protocol during bolus administration of intravenous contrast. Multiplanar CT image reconstructions and MIPs were obtained to evaluate the vascular anatomy. CONTRAST:  75mL OMNIPAQUE IOHEXOL 350 MG/ML SOLN COMPARISON:  Chest x-ray 10/25/2018 FINDINGS: Cardiovascular: No filling defects in the pulmonary arteries to suggest pulmonary emboli. Heart is enlarged. Diffuse coronary artery calcifications. Pacer wires in the right heart and coronary sinus aortic atherosclerosis. No aneurysm. Mediastinum/Nodes: No mediastinal, hilar, or axillary adenopathy. Lungs/Pleura: Trace bilateral pleural effusions.  There are numerous calcified pleural plaques bilaterally. Patchy bilateral ground-glass airspace opacities. More confluent in solid a shin in the lower lobes. Upper Abdomen: Imaging into the upper abdomen shows no acute findings. Small to moderate  hiatal hernia. Musculoskeletal: Left pacer battery pack noted. Chest wall soft tissues otherwise unremarkable. No acute bony abnormality. Review of the MIP images confirms the above findings. IMPRESSION: Cardiomegaly. Patchy bilateral ground-glass opacities within the lungs with more confluent consolidation in the lower lobes. This could reflect edema or infection. Trace bilateral effusions. No cardiomegaly. Diffuse coronary artery disease. Bilateral calcified pleural plaques. Aortic Atherosclerosis (ICD10-I70.0). Electronically Signed   By: Charlett NoseKevin  Dover M.D.   On: 10/25/2018 09:54   Ct Cervical Spine Wo Contrast  Result Date: 10/22/2018 CLINICAL DATA:  Pt fell while walking dog. Pt states he does not remember why he fell, denies LOC. Abrasions to forehead, back of head, bridge of noseC-spine trauma, high clinical risk (NEXUS/CCR); Head trauma, minor, GCS>=13, high clinical risk, initial exam EXAM: CT HEAD WITHOUT CONTRAST CT CERVICAL SPINE WITHOUT CONTRAST TECHNIQUE: Multidetector CT imaging of the head and cervical spine was performed following the standard protocol without intravenous contrast. Multiplanar CT image reconstructions of the cervical spine were also generated. COMPARISON:  None. FINDINGS: CT HEAD FINDINGS Brain: No intracranial hemorrhage. No parenchymal contusion. No midline shift or mass effect. Basilar cisterns are patent. No skull base fracture. No fluid in the paranasal sinuses or mastoid air cells. Orbits are normal. Mild periventricular white matter hypodensities. Vascular: No hyperdense vessel or unexpected calcification. Skull: Normal. Negative for fracture or focal lesion. Sinuses/Orbits: Paranasal sinuses and mastoid air cells are clear. Orbits are clear. Other: None. CT CERVICAL SPINE FINDINGS Alignment: Normal alignment of the cervical vertebral bodies. Skull base and vertebrae: Normal craniocervical junction. No loss of vertebral body height or disc height. Normal facet  articulation. No evidence of fracture. Soft tissues and spinal canal: No prevertebral soft tissue swelling. No perispinal or epidural hematoma. Disc levels: Mild endplate spurring and joint space narrowing at C5-C7. No subluxation. Upper chest: Clear Other: None IMPRESSION: 1. No intracranial trauma. 2. Mild white matter micro vascular disease. 3. No cervical spine fracture. Electronically Signed   By: Genevive BiStewart  Edmunds M.D.   On: 10/22/2018 11:48   Dg Chest Port 1 View  Result Date: 10/25/2018 CLINICAL DATA:  Shortness of breath. EXAM: PORTABLE CHEST 1 VIEW COMPARISON:  Radiograph yesterday. FINDINGS: Lower lung volumes from prior exam significantly limits evaluation. Left-sided pacemaker in place. Stable cardiomegaly. Bronchovascular crowding and vascular congestion. Bilateral calcified pleural plaques. No visualized pneumothorax. Limited assessment for pleural effusion. Known right rib fractures not well seen. IMPRESSION: 1. Lower lung volumes from prior exam limits evaluation. Stable cardiomegaly. Bronchovascular crowding and vascular congestion. 2. Bilateral calcified pleural plaques. Electronically Signed   By: Narda RutherfordMelanie  Sanford M.D.   On: 10/25/2018 01:46   Portable Chest 1 View  Result Date: 10/23/2018 CLINICAL DATA:  Rib fractures EXAM: PORTABLE CHEST 1 VIEW COMPARISON:  10/22/2018 FINDINGS: Unchanged position of left chest wall AICD leads. Mild cardiomegaly. Redemonstration of upper right rib fractures. No focal airspace consolidation. No pulmonary edema. No pneumothorax or sizable pleural effusion. There is unchanged pulmonary vascular congestion and multiple calcified pleural plaques. IMPRESSION: Unchanged appearance of the chest with calcified pleural plaques and pulmonary vascular congestion. Electronically Signed   By: Deatra RobinsonKevin  Herman M.D.   On: 10/23/2018 06:09   Dg Chest Port 1 View  Result Date: 10/22/2018 CLINICAL DATA:  Weakness, fever of 101, fall while walking  dog. Patient with right  sided rib pain following fall as well. Hx of ventricular tachycardia, HTN, mitral valve repair, CAD, a-fib.weakness, fever EXAM: PORTABLE CHEST 1 VIEW COMPARISON:  01/18/2018 FINDINGS: LEFT-sided pacemaker overlies stable cardiac silhouette. Cardiac silhouette is enlarged. Multiple calcified pleural plaques noted. No airspace disease. Pneumothorax. IMPRESSION: Cardiomegaly without acute findings. Extensive pleural calcifications. Electronically Signed   By: Genevive Bi M.D.   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  He was on high doses apparently, and that may have a different effect on time course of illness,esp if doses increased --intentionally or inadvertently  If he has recurrent VT would use IV amio however  I have reached out to [facility] anjd will try and talk with primary EP about his plans  Continue supportive care  The other question is what are we missing-- thankfully BC are neg with implanted device  His conduction system disease is new, but he does better ( narrower QRS) with pacing So would check /LVEF as last fall was n ormal and if no longer, than more pacing might be better than less Thanks    Sherryl Manges

## 2018-10-28 ENCOUNTER — Inpatient Hospital Stay
Admit: 2018-10-28 | Discharge: 2018-10-28 | Disposition: A | Discharge status: Home / Self Care | Payer: Medicare HMO | Attending: Critical Care Medicine | Admitting: Critical Care Medicine

## 2018-10-28 DIAGNOSIS — I429 Cardiomyopathy, unspecified: Secondary | ICD-10-CM

## 2018-10-28 DIAGNOSIS — J690 Pneumonitis due to inhalation of food and vomit: Secondary | ICD-10-CM

## 2018-10-28 LAB — CBC WITH DIFFERENTIAL/PLATELET
Abs Immature Granulocytes: 0.06 10*3/uL (ref 0.00–0.07)
Basophils Absolute: 0 10*3/uL (ref 0.0–0.1)
Basophils Relative: 0 %
Eosinophils Absolute: 0 10*3/uL (ref 0.0–0.5)
Eosinophils Relative: 0 %
HCT: 39 % (ref 39.0–52.0)
Hemoglobin: 12.3 g/dL — ABNORMAL LOW (ref 13.0–17.0)
Immature Granulocytes: 1 %
Lymphocytes Relative: 5 %
Lymphs Abs: 0.6 10*3/uL — ABNORMAL LOW (ref 0.7–4.0)
MCH: 30 pg (ref 26.0–34.0)
MCHC: 31.5 g/dL (ref 30.0–36.0)
MCV: 95.1 fL (ref 80.0–100.0)
Monocytes Absolute: 0.7 10*3/uL (ref 0.1–1.0)
Monocytes Relative: 5 %
Neutro Abs: 11.4 10*3/uL — ABNORMAL HIGH (ref 1.7–7.7)
Neutrophils Relative %: 89 %
Platelets: 268 10*3/uL (ref 150–400)
RBC: 4.1 MIL/uL — ABNORMAL LOW (ref 4.22–5.81)
RDW: 14.8 % (ref 11.5–15.5)
WBC: 12.8 10*3/uL — ABNORMAL HIGH (ref 4.0–10.5)
nRBC: 0 % (ref 0.0–0.2)

## 2018-10-28 LAB — BASIC METABOLIC PANEL
Anion gap: 8 (ref 5–15)
BUN: 48 mg/dL — ABNORMAL HIGH (ref 8–23)
CO2: 28 mmol/L (ref 22–32)
Calcium: 9.2 mg/dL (ref 8.9–10.3)
Chloride: 107 mmol/L (ref 98–111)
Creatinine, Ser: 1.09 mg/dL (ref 0.61–1.24)
GFR calc Af Amer: >60 mL/min (ref >60)
GFR calc non Af Amer: >60 mL/min (ref >60)
Glucose, Bld: 120 mg/dL — ABNORMAL HIGH (ref 70–99)
Potassium: 4.5 mmol/L (ref 3.5–5.1)
Sodium: 143 mmol/L (ref 135–145)

## 2018-10-28 LAB — GLUCOSE, CAPILLARY: Glucose-Capillary: 127 mg/dL — ABNORMAL HIGH (ref 70–99)

## 2018-10-28 LAB — ECHOCARDIOGRAM COMPLETE
Height: 71 in
Weight: 2610.25 oz

## 2018-10-28 LAB — MAGNESIUM: Magnesium: 2.3 mg/dL (ref 1.7–2.4)

## 2018-10-28 LAB — BRAIN NATRIURETIC PEPTIDE: B Natriuretic Peptide: 233 pg/mL — ABNORMAL HIGH (ref 0.0–100.0)

## 2018-10-28 LAB — PROCALCITONIN: Procalcitonin: 0.52 ng/mL

## 2018-10-28 MED ORDER — SODIUM CHLORIDE 0.9 % IV SOLN
3.0000 g | Freq: Four times a day (QID) | INTRAVENOUS | Status: DC
  Administered 2018-10-28 – 2018-10-31 (×12): 3 g via INTRAVENOUS
  Filled 2018-10-28: qty 8
  Filled 2018-10-28 (×9): qty 3
  Filled 2018-10-28: qty 8
  Filled 2018-10-28 (×2): qty 3
  Filled 2018-10-28: qty 8
  Filled 2018-10-28: qty 3
  Filled 2018-10-28: qty 8

## 2018-10-28 NOTE — Progress Notes (Addendum)
Care assumed from [place], temp on HFNC decreased to address excessive condensation issues. {er Dr Patsey Berthold we will allow some degree of permissive low O2 sats at this time.  Pt is asymptomatic, although sats are in mid 72s

## 2018-10-28 NOTE — Progress Notes (Signed)
[facility] [place] NAME: Darius Miller    MR#:  [id number]  DATE OF BIRTH:  10/03/1945  SUBJECTIVE:  Patient was not discharged last night due to increasing shortness of breath.  Patient is on high flow today with O2 saturations 90 to 91%.  Patient is somewhat confused.  REVIEW OF SYSTEMS:  Patient with confusion this morning But able to answer questions  Tolerating Diet: yes    DRUG ALLERGIES:   Allergies  Allergen Reactions  . Flomax [Tamsulosin Hcl]     VITALS:  Blood pressure (!) 122/100, pulse 91, temperature 98.6 F (37 C), temperature source Axillary, resp. rate 20, height 5\' 11"  (1.803 m), weight 74 kg, SpO2 (!) 87 %.  PHYSICAL EXAMINATION:  Constitutional: Appears well-developed and well-nourished. No distress. HENT: Normocephalic. Marland Kitchen Oropharynx is clear and moist.  Eyes: Conjunctivae and EOM are normal. PERRLA, no scleral icterus.  Neck: Normal ROM. Neck supple. No JVD. No tracheal deviation. CVS: RRR, S1/S2 +, no murmurs, no gallops, no carotid bruit.  Pulmonary: Decreased breath sounds throughout lung fields. Some crepitations. Abdominal: Soft. BS +,  no distension, tenderness, rebound or guarding.  Musculoskeletal: Normal range of motion. No edema and no tenderness.  Neuro: Alert. CN 2-12 grossly intact. No focal deficits. Skin: Skin is warm and dry. No rash noted. Psychiatric: Confused    LABORATORY PANEL:   CBC Recent Labs  Lab 10/28/18 0341  WBC 12.8*  HGB 12.3*  HCT 39.0  PLT 268   ------------------------------------------------------------------------------------------------------------------  Chemistries  Recent Labs  Lab 10/22/18 1040  10/28/18 0341  NA 140   < > 143  K 3.7   < > 4.5  CL 101   < > 107  CO2 30   < > 28  GLUCOSE 128*   < > 120*  BUN 14   < > 48*  CREATININE 1.13   < > 1.09  CALCIUM 8.9   < > 9.2  MG  --   --  2.3  AST 44*  --   --   ALT 37  --   --   ALKPHOS 117  --    --   BILITOT 0.9  --   --    < > = values in this interval not displayed.   ------------------------------------------------------------------------------------------------------------------  Cardiac Enzymes No results for input(s): TROPONINI in the last 168 hours. ------------------------------------------------------------------------------------------------------------------  RADIOLOGY:     ASSESSMENT AND PLAN:   73 year old male with PAF and CAD who presented after mechanical fall and suffered rib fracture.  1. . Acute respiratory failure, hypoxic: This is due to combination of atelectasis from rib fractures and pneumonia from atelectasis.  Unasyn for aspiration needed BiPAP CT chest yesterday showed no evidence of PE however does show pneumonia Less likely pulmonary edema Repeat covid 19 testing negative As per pulmonologist, esophagus is dilated, may be having aspirations repeatedly.  2..  Rib fractures: Patient is being treated conservatively.  Rib fractures are status post mechanical fall.  CT head and CT C-spine were negative for acute fracture. Continue incentive spirometer.  3.  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  CAD status post stents: Continue aspirin, metoprolol, and Lipitor.  5.  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  COPD without signs of exacerbation: Continue inhalers  7.  Essential hypertension: Patient will continue on metoprolol  Management plans discussed with the patient and he is in agreement.  CODE STATUS: full  TOTAL TIME TAKING CARE OF THIS PATIENT: 32 minutes.   POSSIBLE D/C 3 days    Adrian Saran M.D on 10/24/2018 at 5:58 PM  Between 7am to 6pm - Pager - [phone]  After 6pm go to [email or web address] - password [email or web address]  [facility]  Office   [phone]  CC: Primary care physician; Gracelyn Nurse, MD  Note: This dictation was prepared with Dragon dictation along with smaller phrase technology. Any transcriptional errors that result from this process are unintentional.

## 2018-10-28 NOTE — Progress Notes (Signed)
*  PRELIMINARY RESULTS* Echocardiogram 2D Echocardiogram has been performed.  Darius Miller 10/28/2018, 12:34 PM

## 2018-10-28 NOTE — Progress Notes (Signed)
Name: Darius FreshwaterJames W. Khachatryan MRN: [id number] DOB: 01-29-1946    BRIEF PATIENT DESCRIPTION: Pt admitted 08/15 s/p fall resulting in a rib fracture transferred to ICU 08/19 with worsening hypoxemic respiratory failure and respiratory distress.  He was placed on BiPAP.  Chest x-ray reveals patchy bilateral infiltrates.  CT of chest from 8/18 reveals patchy, predominantly interstitial infiltrates in both lungs likely pneumonitis in the setting of chronic aspiration although there is concern this could be amiodarone induced   STUDIES/EVENTS 10/22/2018: ED s/p fall. 10/23/2018: Continued to be febrile. Required O2 via [place]. CSW with recommendation to transfer to SNF - [facility] 10/25/2018: Plan was to discharge to [facility] today. He was hypoxic (60's on 2L oxygen). Started on high flow. 1 dose Lasix given. [facility] came to pick him up. Patient at that time was on 10 L high flow with SpO2 @ 86%. Kept in hospital.  BNP baseline 346.0. CXR completed --> stable cardiomegaly, bronchovascular crowding and vascular congestion, and bilateral calcified pleural plaques.  10/26/18: On High Flow - to 55L @ 100% FiO2. Started on Cefepime.  9 AM patient on HFNC 55 lpm @ 100% - with O2 saturation in the high 80's, and was lethargic. Patient admitted to ICU for further management. 10/28/18: Tolerating 45L @70 %. Pt and pts wife states after pt eats he has frequent episodes of coughing and he also endorses problems with acid reflux.  Therefore, cefepime and doxycycline stopped and started unasyn for aspiration   PAST MEDICAL HISTORY :   has a past medical history of Atrial fibrillation (HCC), Coronary artery disease  s/p RCA stents, History of mitral valve repair, Hypertension, ICD (implantable cardioverter-defibrillator), biventricular, Medtronic NO  atrial lead, Kidney stones, Stroke [facility](HCC), and Ventricular tachycardia (HCC).  has a past surgical history that includes Mitral valve repair (October 16, 2014); Cardiac  catheterization; Coronary angioplasty; Coronary stent 2009 (2009); and ICD IMPLANT.  SUBJECTIVE Pt currently tolerating 70% HFNC   VITAL SIGNS: Temp:  [97 F (36.1 C)-98.4 F (36.9 C)] 97.7 F (36.5 C) (08/21 0800) Pulse Rate:  [58-70] 70 (08/21 1000) Resp:  [18-32] 22 (08/21 1000) BP: (88-131)/(60-79) 127/79 (08/21 1000) SpO2:  [85 %-97 %] 95 % (08/21 1000) FiO2 (%):  [60 %-80 %] 70 % (08/21 0925)  I/O last 3 completed shifts: In: 2085 [I.V.:262; IV Piggyback:1823.1] Out: 1825 [Urine:1825] No intake/output data recorded.  SpO2: 95 % O2 Flow Rate (L/min): 45 L/min FiO2 (%): 70 %  Physical Examination:  General: well developed, well nourished male, NAD  Neuro: alert disoriented to time, follows commands  Cardiovascular: nsr, rrr, no R/G  Lungs: RLL rhonchi, diminished all other lobes, non labored Abdomen: +BS x4, soft, non tender, non distended  Musculoskeletal: normal bulk and tone, no edema  Skin: intact no rashes or lesions present   CBC Latest Ref Rng & Units 10/28/2018 10/27/2018 10/23/2018  WBC 4.0 - 10.5 K/uL 12.8(H) 8.8 9.0  Hemoglobin 13.0 - 17.0 g/dL 12.3(L) 11.3(L) 12.3(L)  Hematocrit 39.0 - 52.0 % 39.0 34.8(L) 38.6(L)  Platelets 150 - 400 K/uL 268 213 166   CMP Latest Ref Rng & Units 10/28/2018 10/27/2018 10/26/2018  Glucose 70 - 99 mg/dL 562(Z120(H) 308(M154(H) 578(I141(H)  BUN 8 - 23 mg/dL 69(G48(H) 29(B32(H) 28(U25(H)  Creatinine 0.61 - 1.24 mg/dL 1.321.09 4.400.88 1.021.03  Sodium 135 - 145 mmol/L 143 139 137  Potassium 3.5 - 5.1 mmol/L 4.5 4.1 3.7  Chloride 98 - 111 mmol/L 107 104 97(L)  CO2 22 - 32 mmol/L 28 31  30  Calcium 8.9 - 10.3 mg/dL 9.2 8.8(L) 8.9  Total Protein 6.5 - 8.1 g/dL - - -  Total Bilirubin 0.3 - 1.2 mg/dL - - -  Alkaline Phos 38 - 126 U/L - - -  AST 15 - 41 U/L - - -  ALT 0 - 44 U/L - - -  Baseline PCT: 0.57  MEDICATIONS: I have reviewed all medications and confirmed regimen as documented CULTURE RESULTS   Recent Results (from the past 240 hour(s))  Blood culture  (routine x 2)     Status: None   Collection Time: 10/22/18 11:28 AM   Specimen: BLOOD  Result Value Ref Range Status   Specimen Description BLOOD LAC  Final   Special Requests   Final    BOTTLES DRAWN AEROBIC AND ANAEROBIC Blood Culture adequate volume   Culture   Final    NO GROWTH 5 DAYS Performed at [facility], [address] [place], [place] [address]    Report Status 10/27/2018 FINAL  Final  Blood culture (routine x 2)     Status: None   Collection Time: 10/22/18 11:28 AM   Specimen: BLOOD  Result Value Ref Range Status   Specimen Description BLOOD R FA  Final   Special Requests   Final    BOTTLES DRAWN AEROBIC AND ANAEROBIC Blood Culture results may not be optimal due to an excessive volume of blood received in culture bottles   Culture   Final    NO GROWTH 5 DAYS Performed at [facility] [address], [place], [place] [address]    Report Status 10/27/2018 FINAL  Final  SARS Coronavirus 2 [facility] order, Performed in [place] hospital lab) Nasopharyngeal Nasopharyngeal Swab     Status: None   Collection Time: 10/22/18 11:28 AM   Specimen: Nasopharyngeal Swab  Result Value Ref Range Status   SARS Coronavirus 2 NEGATIVE NEGATIVE Final    Comment: (NOTE) If result is NEGATIVE SARS-CoV-2 target nucleic acids are NOT DETECTED. The SARS-CoV-2 RNA is generally detectable in upper and lower  respiratory specimens during the acute phase of infection. The lowest  concentration of SARS-CoV-2 viral copies this assay can detect is 250  copies / mL. A negative result does not preclude SARS-CoV-2 infection  and should not be used as the sole basis for treatment or other  patient management decisions.  A negative result may occur with  improper specimen collection / handling, submission of specimen other  than nasopharyngeal swab, presence of viral mutation(s) within the  areas targeted by this assay, and inadequate number of viral copies  (<250 copies /  mL). A negative result must be combined with clinical  observations, patient history, and epidemiological information. If result is POSITIVE SARS-CoV-2 target nucleic acids are DETECTED. The SARS-CoV-2 RNA is generally detectable in upper and lower  respiratory specimens dur ing the acute phase of infection.  Positive  results are indicative of active infection with SARS-CoV-2.  Clinical  correlation with patient history and other diagnostic information is  necessary to determine patient infection status.  Positive results do  not rule out bacterial infection or co-infection with other viruses. If result is PRESUMPTIVE POSTIVE SARS-CoV-2 nucleic acids MAY BE PRESENT.   A presumptive positive result was obtained on the submitted specimen  and confirmed on repeat testing.  While 2019 novel coronavirus  (SARS-CoV-2) nucleic acids may be present in the submitted sample  additional confirmatory testing may be necessary for epidemiological  and / or  clinical management purposes  to differentiate between  SARS-CoV-2 and other Sarbecovirus currently known to infect humans.  If clinically indicated additional testing with an alternate test  methodology [phone]) is advised. The SARS-CoV-2 RNA is generally  detectable in upper and lower respiratory sp ecimens during the acute  phase of infection. The expected result is Negative. Fact Sheet for Patients:  BoilerBrush.com.cy Fact Sheet for Healthcare Providers: https://pope.com/ This test is not yet approved or cleared by the [place] FDA and has been authorized for detection and/or diagnosis of SARS-CoV-2 by FDA under an Emergency Use Authorization (EUA).  This EUA will remain in effect (meaning this test can be used) for the duration of the COVID-19 declaration under Section 564(b)(1) of the Act, 21 U.S.C. section 360bbb-3(b)(1), unless the authorization is terminated or revoked sooner.  Performed at [facility], [address], [place], [place] [address]   Urine culture     Status: Abnormal   Collection Time: 10/22/18 12:08 PM   Specimen: Urine, Random  Result Value Ref Range Status   Specimen Description   Final    URINE, RANDOM Performed at [facility], [address] [place], [place] [address]    Special Requests   Final    NONE Performed at [facility], [address] [place], [place] [address]    Culture (A)  Final    <10,000 COLONIES/mL INSIGNIFICANT GROWTH Performed at [facility], [address], [place], [place] [address]    Report Status 10/23/2018 FINAL  Final  SARS Coronavirus 2 [facility] order, Performed in [facility] hospital lab) Nasopharyngeal Nasopharyngeal Swab     Status: None   Collection Time: 10/25/18  4:19 PM   Specimen: Nasopharyngeal Swab  Result Value Ref Range Status   SARS Coronavirus 2 NEGATIVE NEGATIVE Final    Comment: (NOTE) If result is NEGATIVE SARS-CoV-2 target nucleic acids are NOT DETECTED. The SARS-CoV-2 RNA is generally detectable in upper and lower  respiratory specimens during the acute phase of infection. The lowest  concentration of SARS-CoV-2 viral copies this assay can detect is 250  copies / mL. A negative result does not preclude SARS-CoV-2 infection  and should not be used as the sole basis for treatment or other  patient management decisions.  A negative result may occur with  improper specimen collection / handling, submission of specimen other  than nasopharyngeal swab, presence of viral mutation(s) within the  areas targeted by this assay, and inadequate number of viral copies  (<250 copies / mL). A negative result must be combined with clinical  observations, patient history, and epidemiological information. If result is POSITIVE SARS-CoV-2 target nucleic acids are DETECTED. The SARS-CoV-2 RNA is generally detectable in upper and lower  respiratory specimens dur  ing the acute phase of infection.  Positive  results are indicative of active infection with SARS-CoV-2.  Clinical  correlation with patient history and other diagnostic information is  necessary to determine patient infection status.  Positive results do  not rule out bacterial infection or co-infection with other viruses. If result is PRESUMPTIVE POSTIVE SARS-CoV-2 nucleic acids MAY BE PRESENT.   A presumptive positive result was obtained on the submitted specimen  and confirmed on repeat testing.  While 2019 novel coronavirus  (SARS-CoV-2) nucleic acids may be present in the submitted sample  additional confirmatory testing may be necessary for epidemiological  and / or clinical management purposes  to differentiate between  SARS-CoV-2 and other Sarbecovirus currently known to infect  humans.  If clinically indicated additional testing with an alternate test  methodology [phone]) is advised. The SARS-CoV-2 RNA is generally  detectable in upper and lower respiratory sp ecimens during the acute  phase of infection. The expected result is Negative. Fact Sheet for Patients:  BoilerBrush.com.cyhttps://www.fda.gov/media/136312/download Fact Sheet for Healthcare Providers: https://pope.com/https://www.fda.gov/media/136313/download This test is not yet approved or cleared by the [place] FDA and has been authorized for detection and/or diagnosis of SARS-CoV-2 by FDA under an Emergency Use Authorization (EUA).  This EUA will remain in effect (meaning this test can be used) for the duration of the COVID-19 declaration under Section 564(b)(1) of the Act, 21 U.S.C. section 360bbb-3(b)(1), unless the authorization is terminated or revoked sooner. Performed at [facility], [address] [place], [place] [address]    IMAGING   CXR Independently reviewed-   No results found. 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 chronic aspiration)  PLAN/REC: Continue BiPAP as needed Continue supplemental oxygen to maintain SPO2 >90% Diuresis as permitted by BP and renal function Hold amiodarone for now (outpatient cardiologist aware) Continue methylprednisolone 40 mg daily  Cefepime and doxycycline stopped 08/21 and started unasyn due to possible aspiration  Trend WBC and monitor fever curve If possible will obtain sputum sample  Echo pending   -Discussed code status with pt and pts wife who is currently at beside they have decided to change pts code status to DO NOT RESUSCITATE  Sonda Rumbleana [place], AGNP  Pulmonary/Critical Care Pager [phone] (please enter 7 digits) PCCM Consult Pager [phone] (please enter 7 digits)

## 2018-10-28 NOTE — Progress Notes (Signed)
[employer] [facility] [place] [facility]   PATIENT NAME: Darius Miller    MR#:  [id number]  DATE OF BIRTH:  1945-03-30  SUBJECTIVE:  Patient was not discharged last night due to increasing shortness of breath.  Patient is on high flow today with O2 saturations 90 to 91%.  Patient is somewhat confused.  REVIEW OF SYSTEMS:  Patient with confusion this morning But able to answer questions  Tolerating Diet: yes    DRUG ALLERGIES:   Allergies  Allergen Reactions  . Flomax [Tamsulosin Hcl]     VITALS:  Blood pressure (!) 122/100, pulse 91, temperature 98.6 F (37 C), temperature source Axillary, resp. rate 20, height 5\' 11"  (1.803 m), weight 74 kg, SpO2 (!) 87 %.  PHYSICAL EXAMINATION:  Constitutional: Appears well-developed and well-nourished. No distress. HENT: Normocephalic. Marland Kitchen Oropharynx is clear and moist.  Eyes: Conjunctivae and EOM are normal. PERRLA, no scleral icterus.  Neck: Normal ROM. Neck supple. No JVD. No tracheal deviation. CVS: RRR, S1/S2 +, no murmurs, no gallops, no carotid bruit.  Pulmonary: Decreased breath sounds throughout lung fields. Some crepitations. Abdominal: Soft. BS +,  no distension, tenderness, rebound or guarding.  Musculoskeletal: Normal range of motion. No edema and no tenderness.  Neuro: Alert. CN 2-12 grossly intact. No focal deficits. Skin: Skin is warm and dry. No rash noted. Psychiatric: Confused    LABORATORY PANEL:   CBC Recent Labs  Lab 10/28/18 0341  WBC 12.8*  HGB 12.3*  HCT 39.0  PLT 268   ------------------------------------------------------------------------------------------------------------------  Chemistries  Recent Labs  Lab 10/22/18 1040  10/28/18 0341  NA 140   < > 143  K 3.7   < > 4.5  CL 101   < > 107  CO2 30   < > 28  GLUCOSE 128*   < > 120*  BUN 14   < > 48*  CREATININE 1.13   < > 1.09  CALCIUM 8.9   < > 9.2  MG  --   --  2.3  AST 44*  --   --   ALT 37  --   --   ALKPHOS 117  --    --   BILITOT 0.9  --   --    < > = values in this interval not displayed.   ------------------------------------------------------------------------------------------------------------------  Cardiac Enzymes No results for input(s): TROPONINI in the last 168 hours. ------------------------------------------------------------------------------------------------------------------  RADIOLOGY:     ASSESSMENT AND PLAN:   73 year old male with PAF and CAD who presented after mechanical fall and suffered rib fracture.  1. . Acute respiratory failure, hypoxic: This is due to combination of atelectasis from rib fractures and pneumonia from atelectasis.  Cefepime. needed BiPAP CT chest yesterday showed no evidence of PE however does show pneumonia Less likely pulmonary edema Repeat covid 19 testing negative   2..  Rib fractures: Patient is being treated conservatively.  Rib fractures are status post mechanical fall.  CT head and CT C-spine were negative for acute fracture. Continue incentive spirometer.  3.  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  CAD status post stents: Continue aspirin, metoprolol, and Lipitor.  5.  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  COPD without signs of exacerbation: Continue inhalers  7.  Essential hypertension: Patient will continue on metoprolol      Management plans discussed with the patient and he is  in agreement.  CODE STATUS: full  TOTAL TIME TAKING CARE OF THIS PATIENT: 32 minutes.   POSSIBLE D/C 3 days    Bettey Costa M.D on 10/24/2018 at 5:56 PM  Between 7am to 6pm - Pager - [phone]  After 6pm go to [email or web address] - password [email or web address] [facility]  Office  [phone]  CC: Primary care physician; Baxter Hire, MD  Note: This dictation was prepared with Dragon  dictation along with smaller phrase technology. Any transcriptional errors that result from this process are unintentional.

## 2018-10-28 NOTE — Progress Notes (Addendum)
Pharmacy Electrolyte Monitoring Consult:  Pharmacy consulted to assist in monitoring and replacing electrolytes in this 73 y.o. male admitted on 10/22/2018. Patient being treated for acute respiratory failure secondary to pneumonitis secondary to chronic aspiration.   Labs:  Sodium (mmol/L)  Date Value  10/28/2018 143   Potassium (mmol/L)  Date Value  10/28/2018 4.5   Magnesium (mg/dL)  Date Value  10/28/2018 2.3   Calcium (mg/dL)  Date Value  10/28/2018 9.2   Albumin (g/dL)  Date Value  10/22/2018 3.7    Assessment/Plan: No replacement warranted at this time. Furosemide 40mg  PO Daily continued.   Due to extensive cardiac issues, will replace for goal potassium ~ 4 and goal magnesium ~ 2.   Pharmacy will continue to monitor and adjust per consult.   Simpson,Michael L 10/28/2018 2:59 PM

## 2018-10-28 NOTE — Progress Notes (Signed)
Pharmacy Antibiotic Note  Darius Miller is a 73 y.o. male admitted on 10/22/2018 with pneumonia. Patient initially given ceftriaxone and azithromycin at the ED. 8/15 EKG showed QTc = 510 ms. Azithromycin transitioned to doxycyline on 8/19. Ceftriaxone transitioned to cefepime on 8/19.  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 Doxycycline was also discontinued. Cefepime is being transitioned to Unasyn for aspiration pneumonia. Patient is afebrile and his WBC has increased while PCT has decreased (currently 0.52). Pharmacy has been consulted for Unasyn dosing.  Plan: Discontinue cefepime 2g IV Q8H. Initiate Unasyn 3g Q6H.  Height: 5\' 11"  (180.3 cm) Weight: 163 lb 2.3 oz (74 kg) IBW/kg (Calculated) : 75.3  Temp (24hrs), Avg:97.7 F (36.5 C), Min:97 F (36.1 C), Max:98.4 F (36.9 C)  Recent Labs  Lab 10/22/18 1040 10/23/18 0422 10/26/18 0418 10/27/18 0434 10/28/18 0341  WBC 6.0 9.0  --  8.8 12.8*  CREATININE 1.13 0.96 1.03 0.88 1.09  LATICACIDVEN 1.7  --   --   --   --     Estimated Creatinine Clearance: 64.1 mL/min (by C-G formula based on SCr of 1.09 mg/dL).    Allergies  Allergen Reactions  . Flomax [Tamsulosin Hcl]     Antimicrobials this admission: azithromycin 8/15 >> 8/17 ceftriaxone 8/15 >> 8/17 cefepime 8/19 >> 08/21 doxycycline 8/19 >> 08/21 Unasyn 08/21 >>  Microbiology results: 08/15 BCx: no growth  08/15 UCx: insignificant growth  Thank you for allowing pharmacy to be a part of this patient's care.  Darius Miller Dear [name] 10/28/2018 11:42 AM

## 2018-10-29 ENCOUNTER — Inpatient Hospital Stay: Payer: Medicare HMO

## 2018-10-29 LAB — CBC WITH DIFFERENTIAL/PLATELET
Abs Immature Granulocytes: 0.07 10*3/uL (ref 0.00–0.07)
Basophils Absolute: 0 10*3/uL (ref 0.0–0.1)
Basophils Relative: 0 %
Eosinophils Absolute: 0 10*3/uL (ref 0.0–0.5)
Eosinophils Relative: 0 %
HCT: 37.6 % — ABNORMAL LOW (ref 39.0–52.0)
Hemoglobin: 11.8 g/dL — ABNORMAL LOW (ref 13.0–17.0)
Immature Granulocytes: 1 %
Lymphocytes Relative: 4 %
Lymphs Abs: 0.5 10*3/uL — ABNORMAL LOW (ref 0.7–4.0)
MCH: 29.9 pg (ref 26.0–34.0)
MCHC: 31.4 g/dL (ref 30.0–36.0)
MCV: 95.2 fL (ref 80.0–100.0)
Monocytes Absolute: 0.7 10*3/uL (ref 0.1–1.0)
Monocytes Relative: 6 %
Neutro Abs: 9.6 10*3/uL — ABNORMAL HIGH (ref 1.7–7.7)
Neutrophils Relative %: 89 %
Platelets: 302 10*3/uL (ref 150–400)
RBC: 3.95 MIL/uL — ABNORMAL LOW (ref 4.22–5.81)
RDW: 14.6 % (ref 11.5–15.5)
WBC: 10.8 10*3/uL — ABNORMAL HIGH (ref 4.0–10.5)
nRBC: 0 % (ref 0.0–0.2)

## 2018-10-29 LAB — BASIC METABOLIC PANEL
Anion gap: 9 (ref 5–15)
BUN: 50 mg/dL — ABNORMAL HIGH (ref 8–23)
CO2: 30 mmol/L (ref 22–32)
Calcium: 9.2 mg/dL (ref 8.9–10.3)
Chloride: 106 mmol/L (ref 98–111)
Creatinine, Ser: 1.16 mg/dL (ref 0.61–1.24)
GFR calc Af Amer: >60 mL/min (ref >60)
GFR calc non Af Amer: >60 mL/min (ref >60)
Glucose, Bld: 124 mg/dL — ABNORMAL HIGH (ref 70–99)
Potassium: 4.4 mmol/L (ref 3.5–5.1)
Sodium: 145 mmol/L (ref 135–145)

## 2018-10-29 LAB — MAGNESIUM: Magnesium: 2.4 mg/dL (ref 1.7–2.4)

## 2018-10-29 MED ORDER — VENLAFAXINE HCL 37.5 MG PO TABS
37.5000 mg | ORAL_TABLET | Freq: Two times a day (BID) | ORAL | Status: AC
  Administered 2018-10-30 – 2018-11-07 (×18): 37.5 mg via ORAL
  Filled 2018-10-29 (×20): qty 1

## 2018-10-29 NOTE — Progress Notes (Signed)
Pharmacy Electrolyte Monitoring Consult:  Pharmacy consulted to assist in monitoring and replacing electrolytes in this 73 y.o. male admitted on 10/22/2018. Patient being treated for acute respiratory failure secondary to pneumonitis secondary to chronic aspiration.   Labs:  Sodium (mmol/L)  Date Value  10/29/2018 145   Potassium (mmol/L)  Date Value  10/29/2018 4.4   Magnesium (mg/dL)  Date Value  10/29/2018 2.4   Calcium (mg/dL)  Date Value  10/29/2018 9.2   Albumin (g/dL)  Date Value  10/22/2018 3.7    Assessment/Plan: No replacement warranted at this time. Furosemide 40mg  PO Daily continued.   Due to extensive cardiac issues, will replace for goal potassium ~ 4 and goal magnesium ~ 2.   Pharmacy will continue to monitor and adjust per consult.   Pernell Dupre, PharmD, BCPS Clinical Pharmacist 10/29/2018 5:28 AM

## 2018-10-29 NOTE — Progress Notes (Signed)
[employer] -  at [facility]   PATIENT NAME: Darius Miller    MR#:  [id number]  DATE OF BIRTH:  December 02, 1945  SUBJECTIVE:   Transferred to ICU 8/19 due to increasing shortness of breath.  Patient is on high flow today with O2 saturations 90 to 91% with FIO2 70%.  Patient is alert and oriented  REVIEW OF SYSTEMS:  He denies any fever fatigue or weakness. He denies blurring of eye or double vision. Denies ringing in the ears. He denies any cough but has shortness of breath and feels comfortable with high flow nasal cannula. He denies chest pain, palpitation or edema. He denies abdominal pain, nausea, vomiting, diarrhea He denies any focal neurological weakness. He denies depression or suicidal ideation.  Tolerating Diet: yes    DRUG ALLERGIES:   Allergies  Allergen Reactions  . Flomax [Tamsulosin Hcl]     VITALS:  Blood pressure 128/65, pulse 64, temperature 98.5 F (36.9 C), temperature source Oral, resp. rate (!) 23, height 5\' 11"  (1.803 m), weight 74 kg, SpO2 (!) 89 %.  PHYSICAL EXAMINATION:  Constitutional: Appears well-developed and well-nourished. No distress. HENT: Normocephalic. Marland Kitchen Oropharynx is clear and moist.  Eyes: Conjunctivae and EOM are normal. PERRLA, no scleral icterus.  Neck: Normal ROM. Neck supple. No JVD. No tracheal deviation. CVS: RRR, S1/S2 +, no murmurs, no gallops, no carotid bruit.  Pulmonary: Decreased breath sounds throughout lung fields. Some crepitations.  High flow nasal cannula oxygen. Abdominal: Soft. BS +,  no distension, tenderness, rebound or guarding.  Musculoskeletal: Normal range of motion. No edema and no tenderness.  Neuro: Alert. CN 2-12 grossly intact. No focal deficits. Skin: Skin is warm and dry. No rash noted. Psychiatric: Alert and oriented x3.    LABORATORY PANEL:   CBC Recent Labs  Lab 10/29/18 0451  WBC 10.8*  HGB 11.8*  HCT 37.6*  PLT 302    ------------------------------------------------------------------------------------------------------------------  Chemistries  Recent Labs  Lab 10/29/18 0451  NA 145  K 4.4  CL 106  CO2 30  GLUCOSE 124*  BUN 50*  CREATININE 1.16  CALCIUM 9.2  MG 2.4   ------------------------------------------------------------------------------------------------------------------  Cardiac Enzymes No results for input(s): TROPONINI in the last 168 hours. ------------------------------------------------------------------------------------------------------------------  RADIOLOGY:     ASSESSMENT AND PLAN:   73 year old male with PAF and CAD who presented after mechanical fall and suffered rib fracture.  1. . Acute respiratory failure, hypoxic: This is due to combination of atelectasis from rib fractures and pneumonia from atelectasis.  Unasyn for aspiration needed BiPAP CT chest yesterday showed no evidence of PE however does show pneumonia Less likely pulmonary edema Repeat covid 19 testing negative As per pulmonologist, esophagus is dilated in past studies, may be having aspirations repeatedly. They discussed with him and his wife and change him to DNR.  2..  Rib fractures: Patient is being treated conservatively.  Rib fractures are status post mechanical fall.  CT head and CT C-spine were negative for acute fracture. Continue incentive spirometer.  3.  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  CAD status post stents: Continue aspirin, metoprolol, and Lipitor.  5.  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  COPD without signs of exacerbation: Continue inhalers  7.  Essential hypertension: Patient will continue on metoprolol      Management plans discussed with the patient and he is in agreement.  CODE  STATUS: full  TOTAL TIME  TAKING CARE OF THIS PATIENT: 32 minutes.   POSSIBLE D/C 2-3 days, still on high flow nasal cannula oxygen.   Bettey Costa M.D on 10/24/2018 at 4:12 PM  Between 7am to 6pm - Pager - [phone]  After 6pm go to [email or web address] - password [email or web address] [facility]  Office  [phone]  CC: Primary care physician; Baxter Hire, MD  Note: This dictation was prepared with Dragon dictation along with smaller phrase technology. Any transcriptional errors that result from this process are unintentional.

## 2018-10-29 NOTE — Progress Notes (Signed)
   10/29/18 1900  Clinical Encounter Type  Visited With Patient  Visit Type Initial  Referral From Other (Comment)  Spiritual Encounters  Spiritual Needs Prayer;Emotional;Other (Comment)  Stress Factors  Patient Stress Factors Loss;Major life changes;Other (Comment)

## 2018-10-29 NOTE — Evaluation (Signed)
Clinical/Bedside Swallow Evaluation Patient Details  Name: Darius Miller MRN: [id number] Date of Birth: [birth year]  Today's Date: 10/29/2018 Time: SLP Start Time (ACUTE ONLY): 1135 SLP Stop Time (ACUTE ONLY): 1220 SLP Time Calculation (min) (ACUTE ONLY): 45 min  Past Medical History:  Past Medical History:  Diagnosis Date  . Atrial fibrillation ([place])   . Coronary artery disease  s/p RCA stents   . History of mitral valve repair   . Hypertension   . 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 Kidney stones   . Stroke ([place])   . Ventricular tachycardia [facility])    Past Surgical History:  Past Surgical History:  Procedure Laterality Date  . CARDIAC CATHETERIZATION    . CORONARY ANGIOPLASTY    . Coronary stent 2009  2009  . ICD IMPLANT    . MITRAL VALVE REPAIR  October 16, 2014   HPI:  Pt is a 73 year old male with history of Moderate Size Hiatal Hernia w/ self-report of Esophageal phase dysmotility, Afib, and Vtach (hx wide complex tachycardia) - managed with ICD (biventricular), CAD s/p RCA stenting, CVA, HTN, mitral valve repair, nephrolithiasis, and BPH seen here in the ICU requiring BiPAP for acute hypoxic respiratory failure. He presented to the ED on 10/22/2018, after falling and hitting right chest wall and head. He notes that he was pulled by his dog while taking him for a walk, became light-headed, and fell. He has been experiencing light headedness for the past month. When he presented to the ED on 10/22/2018, he was hypertensive (155/98), and febrile (38.3). Initial CXR indicated cardiomegaly with extensive pleural calcifications.  During this admission, patient endorses shortness of breath, and occasional non-productive cough.  He has required increased O2 support including HFNC, [place] support. Currently, Patient is on high flow today with O2 saturations 90 to 91%.  Patient's mentation seems to wax and wane since being in the hospital,  including thinking his dog is here in the hospital with him.  Per Pulmonologist following pt this admission, chronic aspiration is suspected due to large Esophagus w/ poor motility.  Pt endorses Esophageal phase dysmotility and food "getting stuck" occasionally - pointing to mid-Esophagus (all at home prior to this hospitalization).   Assessment / Plan / Recommendation Clinical Impression  Pt appears to present w/ adequate oropharyngeal phase swallow function w/ no overt clinical s/s of aspiration; no decline in respiratory status during/post po trials. O2 sats remained ~90% during/post po trials. Pt is at reduced risk for aspiration from an oropharyngeal phase standpoint when following general aspiration precautions w/ oral intake. He does benefit from assistance w/ tray setup and positioning during meals d/t generalized weakness and Cognitive decline during this admission. HOWEVER, pt has a baseline of a Moderate Size Hiatal Hernia and self-reports of Esophageal Dysmotility w/ inconsistent Regurgitation w/ oral intake - he stated this occurred at home PRIOR to admission as well. This issue could increase risk for aspiration of REFLUX material thus Pulmonary infection/decline; NOT oropharyngeal phase dysphagia. Pt consumed few po trials (accepted) after setup w/ no overt s/s of aspiration noted; no wet vocal quality following trials. Pt was to feed self holding the Cup to drink. During the oral phase, pt exhibited timely mastication time/effort w/ the solid food trials; cleared appropriately post swallows. No oral phase deficits noted w/ thin liquids. OM exam was [facility]. Recommend a more Minced/Mech Soft diet consistency w/ Gravy added to moisten well; thin liquids. Recommend general aspiration precautions; assistance  w/ setup at all meals. Recommend Pills in Puree(crushed, powder or liquid forms) for safer swallowing along the Esophageal phase d/t dysmotility. Recommend f/u by Dietician for support as needed --  drink supplement recommended. Recommend MONiTORING for Esophageal phase deficits w/ solid foods, liquids including REGURGITATION of food/liquid and any negative sequelae from material aspirated; GI f/u for Education and management w/ pt/family recommended d/t potential impact on the Pulmonary status; GERD precautions. NSG to reconsult if any decline in oropharyngeal swallowing status while admitted. NSG/MD updated and agreed w/ above. SLP Visit Diagnosis: Dysphagia, unspecified (R13.10)(Esophageal phase dysmotility; Mod. Hiatal hernia)    Aspiration Risk  Mild aspiration risk(from Regurgitation and Reflux issues baseline)    Diet Recommendation  Minced/Mech Soft diet w/ thin liquids; STRICT REFLUX PRECAUTIONS; general aspiration precautions; tray setup assistance at meals, positioning upright  Medication Administration: Whole meds with puree(vs broken down well for easier Esophageal motility)    Other  Recommendations Recommended Consults: Consider GI evaluation;Consider esophageal assessment(for further education and management w/ pt/family) Oral Care Recommendations: Oral care BID;Patient independent with oral care(assist) Other Recommendations: (n/a)   Follow up Recommendations None      Frequency and Duration (n/a)  (n/a)       Prognosis Prognosis for Safe Diet Advancement: Fair Barriers to Reach Goals: Cognitive deficits;Severity of deficits;Time post onset(GI issues baseline) Barriers/Prognosis Comment: min confusion while admitted noted      Swallow Study   General Date of Onset: 10/22/18 HPI: Pt is a 73 year old male with history of Moderate Size Hiatal Hernia w/ self-report of Esophageal phase dysmotility, Afib, and Vtach (hx wide complex tachycardia) - managed with ICD (biventricular), CAD s/p RCA stenting, CVA, HTN, mitral valve repair, nephrolithiasis, and BPH seen here in the ICU requiring BiPAP for acute hypoxic respiratory failure. He presented to the ED on 10/22/2018,  after falling and hitting right chest wall and head. He notes that he was pulled by his dog while taking him for a walk, became light-headed, and fell. He has been experiencing light headedness for the past month. When he presented to the ED on 10/22/2018, he was hypertensive (155/98), and febrile (38.3). Initial CXR indicated cardiomegaly with extensive pleural calcifications.  During this admission, patient endorses shortness of breath, and occasional non-productive cough.  He has required increased O2 support including HFNC, [place] support. Currently, Patient is on high flow today with O2 saturations 90 to 91%.  Patient's mentation seems to wax and wane since being in the hospital, including thinking his dog is here in the hospital with him.  Per Pulmonologist following pt this admission, chronic aspiration is suspected due to large Esophagus w/ poor motility.  Pt endorses Esophageal phase dysmotility and food "getting stuck" occasionally - pointing to mid-Esophagus (all at home prior to this hospitalization). Type of Study: Bedside Swallow Evaluation Previous Swallow Assessment: none noted Diet Prior to this Study: Regular;Thin liquids Temperature Spikes Noted: No(wbc 10.8) Respiratory Status: Nasal cannula(HFNC 40%) History of Recent Intubation: No Behavior/Cognition: Alert;Cooperative;Pleasant mood;Confused;Distractible;Requires cueing(min) Oral Cavity Assessment: Within Functional Limits Oral Care Completed by SLP: Recent completion by staff Oral Cavity - Dentition: Dentures, top;Dentures, bottom(in place) Vision: Functional for self-feeding Self-Feeding Abilities: Able to feed self;Needs assist;Needs set up Patient Positioning: Upright in bed(needed positioning) Baseline Vocal Quality: Normal(grossly) Volitional Cough: Strong Volitional Swallow: Able to elicit    Oral/Motor/Sensory Function Overall Oral Motor/Sensory Function: Within functional limits   Ice Chips Ice chips: Within functional  limits Presentation: Spoon(fed; 2 trials)   Thin Liquid Thin  Liquid: Within functional limits Presentation: Cup;Self Fed;Straw(~4 ozs total )    Nectar Thick Nectar Thick Liquid: Not tested   Honey Thick Honey Thick Liquid: Not tested   Puree Puree: Not tested Other Comments: pt declined   Solid     Solid: Within functional limits Presentation: Spoon(fed; 3 trials accepted) Other Comments: he declined further       Jerilynn Som, MS, CCC-SLP Watson,Katherine 10/29/2018,1:51 PM

## 2018-10-29 NOTE — Progress Notes (Signed)
Patient removed 2 pills from his mouth and dropped them on the floor after the RN left the room.  Dabigatran and Mexiletine were found on the floor.  Patient has GI issues along esophagus (maybe further along as well according to the MD), waiting for possible GI consult.  Speech therapist said he should not swallow pills or capsules at all because they will get stuck along the way down.  All medicines must be opened or crushed.  RN will alert pharmacy because some of his medications cannot be opened or crushed per their instructions.  MD has already spoken with the speech therapist and is aware of the issue.  Phillis Knack, RN

## 2018-10-29 NOTE — Progress Notes (Addendum)
Name: Darius FreshwaterJames W. Woulfe MRN: [id number] DOB: [birth year]    BRIEF PATIENT DESCRIPTION: Pt admitted 08/15 s/p fall resulting in a rib fracture transferred to ICU 08/19 with worsening hypoxemic respiratory failure and respiratory distress.  He was placed on BiPAP.  Chest x-ray reveals patchy bilateral infiltrates.  CT of chest from 8/18 reveals patchy, predominantly interstitial infiltrates in both lungs likely pneumonitis in the setting of chronic aspiration although there is concern this could be amiodarone induced   STUDIES/EVENTS 10/22/2018: ED s/p fall. 10/23/2018: Continued to be febrile. Required O2 via [place]. CSW with recommendation to transfer to SNF - [facility] 10/25/2018: Plan was to discharge to [facility] today. He was hypoxic (60's on 2L oxygen). Started on high flow. 1 dose Lasix given. [facility] came to pick him up. Patient at that time was on 10 L high flow with SpO2 @ 86%. Kept in hospital.  BNP baseline 346.0. CXR completed --> stable cardiomegaly, bronchovascular crowding and vascular congestion, and bilateral calcified pleural plaques.  10/26/18: On High Flow - to 55L @ 100% FiO2. Started on Cefepime.  9 AM patient on HFNC 55 lpm @ 100% - with O2 saturation in the high 80's, and was lethargic. Patient admitted to ICU for further management. 10/28/18: Tolerating 45L @70 %. Pt and pts wife states after pt eats he has frequent episodes of coughing and he also endorses problems with acid reflux.  Therefore, cefepime and doxycycline stopped and started unasyn for aspiration   PAST MEDICAL HISTORY :   has a past medical history of Atrial fibrillation (HCC), Coronary artery disease  s/p RCA stents, History of mitral valve repair, Hypertension, ICD (implantable cardioverter-defibrillator), biventricular, Medtronic NO  atrial lead, Kidney stones, Stroke [facility](HCC), and Ventricular tachycardia (HCC).  has a past surgical history that includes Mitral valve repair (October 16, 2014); Cardiac  catheterization; Coronary angioplasty; Coronary stent 2009 (2009); and ICD IMPLANT.  SUBJECTIVE Pt currently tolerating 70% HFNC.  He remains somewhat confused.  Speech has evaluated him and has recommended chopped/pured diet  VITAL SIGNS: Temp:  [97.6 F (36.4 C)-98.5 F (36.9 C)] 98.5 F (36.9 C) (08/22 1400) Pulse Rate:  [58-72] 64 (08/22 1400) Resp:  [20-28] 23 (08/22 1400) BP: (94-136)/(61-82) 128/65 (08/22 1400) SpO2:  [89 %-94 %] 89 % (08/22 1400) FiO2 (%):  [70 %-85 %] 70 % (08/22 0831)  I/O last 3 completed shifts: In: 1647.9 [P.O.:237; I.V.:21.6; IV Piggyback:1389.3] Out: 2050 [Urine:2050] Total I/O In: 240 [P.O.:240] Out: 1350 [Urine:1350]  SpO2: (!) 89 % O2 Flow Rate (L/min): 40 L/min FiO2 (%): 70 %  Physical Examination:  General: well developed, well nourished male, NAD  Neuro: alert disoriented to time, calm, follows commands  Cardiovascular:  Irregular, rate control, monitor: Nondescript rhythm with occasional pacing, no obvious murmur Lungs: RLL rhonchi, diminished all other lobes, non labored no wheezes Abdomen: +BS x4, soft, non tender, non distended  Musculoskeletal: normal bulk and tone, no edema  Skin: intact no rashes or lesions present   CBC Latest Ref Rng & Units 10/29/2018 10/28/2018 10/27/2018  WBC 4.0 - 10.5 K/uL 10.8(H) 12.8(H) 8.8  Hemoglobin 13.0 - 17.0 g/dL 11.8(L) 12.3(L) 11.3(L)  Hematocrit 39.0 - 52.0 % 37.6(L) 39.0 34.8(L)  Platelets 150 - 400 K/uL 302 268 213   CMP Latest Ref Rng & Units 10/29/2018 10/28/2018 10/27/2018  Glucose 70 - 99 mg/dL 086(V124(H) 784(O120(H) 962(X154(H)  BUN 8 - 23 mg/dL 52(W50(H) 41(L48(H) 24(M32(H)  Creatinine 0.61 - 1.24 mg/dL 0.101.16 2.721.09 5.360.88  Sodium 135 -  145 mmol/L 145 143 139  Potassium 3.5 - 5.1 mmol/L 4.4 4.5 4.1  Chloride 98 - 111 mmol/L 106 107 104  CO2 22 - 32 mmol/L 30 28 31   Calcium 8.9 - 10.3 mg/dL 9.2 9.2 8.6(V8.8(L)  Total Protein 6.5 - 8.1 g/dL - - -  Total Bilirubin 0.3 - 1.2 mg/dL - - -  Alkaline Phos 38 - 126 U/L - - -    AST 15 - 41 U/L - - -  ALT 0 - 44 U/L - - -  Baseline PCT: 0.57  MEDICATIONS: I have reviewed all medications and confirmed regimen as documented CULTURE RESULTS   Recent Results (from the past 240 hour(s))  Blood culture (routine x 2)     Status: None   Collection Time: 10/22/18 11:28 AM   Specimen: BLOOD  Result Value Ref Range Status   Specimen Description BLOOD LAC  Final   Special Requests   Final    BOTTLES DRAWN AEROBIC AND ANAEROBIC Blood Culture adequate volume   Culture   Final    NO GROWTH 5 DAYS Performed at [facility], [address] [place], [place] [address]    Report Status 10/27/2018 FINAL  Final  Blood culture (routine x 2)     Status: None   Collection Time: 10/22/18 11:28 AM   Specimen: BLOOD  Result Value Ref Range Status   Specimen Description BLOOD R FA  Final   Special Requests   Final    BOTTLES DRAWN AEROBIC AND ANAEROBIC Blood Culture results may not be optimal due to an excessive volume of blood received in culture bottles   Culture   Final    NO GROWTH 5 DAYS Performed at [facility], [address] [place], [place] [address]    Report Status 10/27/2018 FINAL  Final  SARS Coronavirus 2 [facility](Hospital order, Performed in [facility] hospital lab) Nasopharyngeal Nasopharyngeal Swab     Status: None   Collection Time: 10/22/18 11:28 AM   Specimen: Nasopharyngeal Swab  Result Value Ref Range Status   SARS Coronavirus 2 NEGATIVE NEGATIVE Final    Comment: (NOTE) If result is NEGATIVE SARS-CoV-2 target nucleic acids are NOT DETECTED. The SARS-CoV-2 RNA is generally detectable in upper and lower  respiratory specimens during the acute phase of infection. The lowest  concentration of SARS-CoV-2 viral copies this assay can detect is 250  copies / mL. A negative result does not preclude SARS-CoV-2 infection  and should not be used as the sole basis for treatment or other  patient management decisions.  A negative result may occur  with  improper specimen collection / handling, submission of specimen other  than nasopharyngeal swab, presence of viral mutation(s) within the  areas targeted by this assay, and inadequate number of viral copies  (<250 copies / mL). A negative result must be combined with clinical  observations, patient history, and epidemiological information. If result is POSITIVE SARS-CoV-2 target nucleic acids are DETECTED. The SARS-CoV-2 RNA is generally detectable in upper and lower  respiratory specimens dur ing the acute phase of infection.  Positive  results are indicative of active infection with SARS-CoV-2.  Clinical  correlation with patient history and other diagnostic information is  necessary to determine patient infection status.  Positive results do  not rule out bacterial infection or co-infection with other viruses. If result is PRESUMPTIVE POSTIVE SARS-CoV-2 nucleic acids MAY BE PRESENT.   A presumptive positive result was obtained on the submitted specimen  and confirmed  on repeat testing.  While 2019 novel coronavirus  (SARS-CoV-2) nucleic acids may be present in the submitted sample  additional confirmatory testing may be necessary for epidemiological  and / or clinical management purposes  to differentiate between  SARS-CoV-2 and other Sarbecovirus currently known to infect humans.  If clinically indicated additional testing with an alternate test  methodology [phone]) is advised. The SARS-CoV-2 RNA is generally  detectable in upper and lower respiratory sp ecimens during the acute  phase of infection. The expected result is Negative. Fact Sheet for Patients:  BoilerBrush.com.cy Fact Sheet for Healthcare Providers: https://pope.com/ This test is not yet approved or cleared by the [place] FDA and has been authorized for detection and/or diagnosis of SARS-CoV-2 by FDA under an Emergency Use Authorization (EUA).  This EUA  will remain in effect (meaning this test can be used) for the duration of the COVID-19 declaration under Section 564(b)(1) of the Act, 21 U.S.C. section 360bbb-3(b)(1), unless the authorization is terminated or revoked sooner. Performed at [facility], [address] [place], [place] [address]   Urine culture     Status: Abnormal   Collection Time: 10/22/18 12:08 PM   Specimen: Urine, Random  Result Value Ref Range Status   Specimen Description   Final    URINE, RANDOM Performed at [facility], [address], [place], [place] [address]    Special Requests   Final    NONE Performed at [facility], [address] [place], [place] [address]    Culture (A)  Final    <10,000 COLONIES/mL INSIGNIFICANT GROWTH Performed at [facility], [address] [place], [place] [address]    Report Status 10/23/2018 FINAL  Final  SARS Coronavirus 2 [facility] order, Performed in [facility] hospital lab) Nasopharyngeal Nasopharyngeal Swab     Status: None   Collection Time: 10/25/18  4:19 PM   Specimen: Nasopharyngeal Swab  Result Value Ref Range Status   SARS Coronavirus 2 NEGATIVE NEGATIVE Final    Comment: (NOTE) If result is NEGATIVE SARS-CoV-2 target nucleic acids are NOT DETECTED. The SARS-CoV-2 RNA is generally detectable in upper and lower  respiratory specimens during the acute phase of infection. The lowest  concentration of SARS-CoV-2 viral copies this assay can detect is 250  copies / mL. A negative result does not preclude SARS-CoV-2 infection  and should not be used as the sole basis for treatment or other  patient management decisions.  A negative result may occur with  improper specimen collection / handling, submission of specimen other  than nasopharyngeal swab, presence of viral mutation(s) within the  areas targeted by this assay, and inadequate number of viral copies  (<250 copies / mL). A negative result must be combined with  clinical  observations, patient history, and epidemiological information. If result is POSITIVE SARS-CoV-2 target nucleic acids are DETECTED. The SARS-CoV-2 RNA is generally detectable in upper and lower  respiratory specimens dur ing the acute phase of infection.  Positive  results are indicative of active infection with SARS-CoV-2.  Clinical  correlation with patient history and other diagnostic information is  necessary to determine patient infection status.  Positive results do  not rule out bacterial infection or co-infection with other viruses. If result is PRESUMPTIVE POSTIVE SARS-CoV-2 nucleic acids MAY BE PRESENT.   A presumptive positive result was obtained on the submitted specimen  and confirmed on repeat testing.  While 2019 novel coronavirus  (SARS-CoV-2) nucleic acids may be present in  the submitted sample  additional confirmatory testing may be necessary for epidemiological  and / or clinical management purposes  to differentiate between  SARS-CoV-2 and other Sarbecovirus currently known to infect humans.  If clinically indicated additional testing with an alternate test  methodology [phone]) is advised. The SARS-CoV-2 RNA is generally  detectable in upper and lower respiratory sp ecimens during the acute  phase of infection. The expected result is Negative. Fact Sheet for Patients:  StrictlyIdeas.no Fact Sheet for Healthcare Providers: BankingDealers.co.za This test is not yet approved or cleared by the [place] FDA and has been authorized for detection and/or diagnosis of SARS-CoV-2 by FDA under an Emergency Use Authorization (EUA).  This EUA will remain in effect (meaning this test can be used) for the duration of the COVID-19 declaration under Section 564(b)(1) of the Act, 21 U.S.C. section 360bbb-3(b)(1), unless the authorization is terminated or revoked sooner. Performed at [facility], [address] [place], [place] [address]    IMAGING   CXR Independently reviewed-   Dg Chest Port 1 View  Result Date: 10/29/2018 CLINICAL DATA:  Acute respiratory failure ([place]) 10/27/2018 EXAM: PORTABLE CHEST 1 VIEW COMPARISON:  10/27/2018 FINDINGS: Patient has LEFT-sided AICD with leads to the coronary sinus and RIGHT ventricle. The heart is enlarged and stable in configuration. Stable calcified pleural plaques. There are patchy airspace filling opacities throughout the lungs bilaterally, stable in appearance. No pneumothorax. IMPRESSION: Stable appearance of the multiple pulmonary opacities. Electronically Signed   By: Nolon Nations M.D.   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 toxicity DDx includes infection typical/atypical (febrile on admission) +/- pulmonary edema (mildly elevated BNP) +/- pneumonitis (amiodarone induced vs. chronic aspiration)  Encephalopathy: Toxic metabolic   PLAN/REC: Continue BiPAP as needed Continue supplemental oxygen to maintain SPO2 >90% Diuresis as permitted by BP and renal function Hold amiodarone for now (outpatient cardiologist aware) Discontinue methylprednisolone due to ongoing confusion issues/encephalopathy Cefepime and doxycycline stopped 08/21 and started unasyn due to possible aspiration  Trend WBC and monitor fever curve Have not been able to obtain sputum sample Echo overall unchanged.  DNR/DNI status  Care coordination performed with the patient's bedside nurse.  Discussed with speech pathology.    Renold Don, MD [facility]

## 2018-10-30 LAB — BASIC METABOLIC PANEL
Anion gap: 8 (ref 5–15)
BUN: 44 mg/dL — ABNORMAL HIGH (ref 8–23)
CO2: 32 mmol/L (ref 22–32)
Calcium: 8.9 mg/dL (ref 8.9–10.3)
Chloride: 106 mmol/L (ref 98–111)
Creatinine, Ser: 0.99 mg/dL (ref 0.61–1.24)
GFR calc Af Amer: >60 mL/min (ref >60)
GFR calc non Af Amer: >60 mL/min (ref >60)
Glucose, Bld: 120 mg/dL — ABNORMAL HIGH (ref 70–99)
Potassium: 3.5 mmol/L (ref 3.5–5.1)
Sodium: 146 mmol/L — ABNORMAL HIGH (ref 135–145)

## 2018-10-30 LAB — MAGNESIUM: Magnesium: 2.3 mg/dL (ref 1.7–2.4)

## 2018-10-30 MED ORDER — POTASSIUM CHLORIDE CRYS ER 20 MEQ PO TBCR
40.0000 meq | EXTENDED_RELEASE_TABLET | Freq: Once | ORAL | Status: AC
  Administered 2018-10-30: 40 meq via ORAL
  Filled 2018-10-30: qty 2

## 2018-10-30 NOTE — Progress Notes (Signed)
Name: Darius FreshwaterJames W. Miller MRN: [id number] DOB: August 27, 1945    BRIEF PATIENT DESCRIPTION: Pt admitted 08/15 s/p fall resulting in a rib fracture transferred to ICU 08/19 with worsening hypoxemic respiratory failure and respiratory distress.  He was placed on BiPAP.  Chest x-ray reveals patchy bilateral infiltrates.  CT of chest from 8/18 reveals patchy, predominantly interstitial infiltrates in both lungs likely pneumonitis in the setting of chronic aspiration although there is concern this could be amiodarone induced   STUDIES/EVENTS 10/22/2018: ED s/p fall. 10/23/2018: Continued to be febrile. Required O2 via [place]. CSW with recommendation to transfer to SNF - [facility] 10/25/2018: Plan was to discharge to [facility] today. He was hypoxic (60's on 2L oxygen). Started on high flow. 1 dose Lasix given. [facility] came to pick him up. Patient at that time was on 10 L high flow with SpO2 @ 86%. Kept in hospital.  BNP baseline 346.0. CXR completed --> stable cardiomegaly, bronchovascular crowding and vascular congestion, and bilateral calcified pleural plaques.  10/26/18: On High Flow - to 55L @ 100% FiO2. Started on Cefepime.  9 AM patient on HFNC 55 lpm @ 100% - with O2 saturation in the high 80's, and was lethargic. Patient admitted to ICU for further management. 10/28/18: . Pt and pts wife states after pt eats he has frequent episodes of coughing and he also endorses problems with acid reflux.  Therefore, cefepime and doxycycline stopped and started unasyn for aspiration  10/30/18: Tolerating 40L @ 60%  PAST MEDICAL HISTORY :   has a past medical history of Atrial fibrillation (HCC), Coronary artery disease  s/p RCA stents, History of mitral valve repair, Hypertension, ICD (implantable cardioverter-defibrillator), biventricular, Medtronic NO  atrial lead, Kidney stones, Stroke [facility](HCC), and Ventricular tachycardia (HCC).  has a past surgical history that includes Mitral valve repair (October 16, 2014); Cardiac catheterization; Coronary angioplasty; Coronary stent 2009 (2009); and ICD IMPLANT.  SUBJECTIVE Pt currently tolerating 60% HFNC.  He remains somewhat confused.  Speech has evaluated him and has recommended chopped/pured diet, antireflux measures  VITAL SIGNS: Temp:  [98.1 F (36.7 C)-98.4 F (36.9 C)] 98.1 F (36.7 C) (08/23 2000) Pulse Rate:  [41-132] 132 (08/23 2100) Resp:  [19-30] 24 (08/23 2100) BP: (122-148)/(62-91) 139/88 (08/23 2100) SpO2:  [70 %-99 %] 93 % (08/23 2100) FiO2 (%):  [60 %-80 %] 67 % (08/23 2000)  I/O last 3 completed shifts: In: 480 [P.O.:480] Out: 3650 [Urine:3650] No intake/output data recorded.  SpO2: 93 % O2 Flow Rate (L/min): 45 L/min FiO2 (%): 67 %  Physical Examination:  General: well developed, well nourished male, comfortable on high flow Neuro: alert disoriented to time/place, cooperative follows commands  Cardiovascular:  Irregular, rate controlled, monitor: Nondescript rhythm with occasional pacing, no obvious murmur Lungs:  Scattered rhonchi, distant sounds, non labored no wheezes Abdomen: +BS x4, soft, non tender, non distended  Musculoskeletal: normal bulk and tone, no edema  Skin: intact no rashes or lesions present   CBC Latest Ref Rng & Units 10/29/2018 10/28/2018 10/27/2018  WBC 4.0 - 10.5 K/uL 10.8(H) 12.8(H) 8.8  Hemoglobin 13.0 - 17.0 g/dL 11.8(L) 12.3(L) 11.3(L)  Hematocrit 39.0 - 52.0 % 37.6(L) 39.0 34.8(L)  Platelets 150 - 400 K/uL 302 268 213   CMP Latest Ref Rng & Units 10/30/2018 10/29/2018 10/28/2018  Glucose 70 - 99 mg/dL 409(W120(H) 119(J124(H) 478(G120(H)  BUN 8 - 23 mg/dL 95(A44(H) 21(H50(H) 08(M48(H)  Creatinine 0.61 - 1.24 mg/dL 5.780.99 4.691.16 6.291.09  Sodium 135 - 145 mmol/L  146(H) 145 143  Potassium 3.5 - 5.1 mmol/L 3.5 4.4 4.5  Chloride 98 - 111 mmol/L 106 106 107  CO2 22 - 32 mmol/L 32 30 28  Calcium 8.9 - 10.3 mg/dL 8.9 9.2 9.2  Total Protein 6.5 - 8.1 g/dL - - -  Total Bilirubin 0.3 - 1.2 mg/dL - - -  Alkaline Phos 38 - 126  U/L - - -  AST 15 - 41 U/L - - -  ALT 0 - 44 U/L - - -  Baseline PCT: 0.57  MEDICATIONS: I have reviewed all medications and confirmed regimen as documented CULTURE RESULTS   Recent Results (from the past 240 hour(s))  Blood culture (routine x 2)     Status: None   Collection Time: 10/22/18 11:28 AM   Specimen: BLOOD  Result Value Ref Range Status   Specimen Description BLOOD LAC  Final   Special Requests   Final    BOTTLES DRAWN AEROBIC AND ANAEROBIC Blood Culture adequate volume   Culture   Final    NO GROWTH 5 DAYS Performed at [facility], [address] [place], [place] [address]    Report Status 10/27/2018 FINAL  Final  Blood culture (routine x 2)     Status: None   Collection Time: 10/22/18 11:28 AM   Specimen: BLOOD  Result Value Ref Range Status   Specimen Description BLOOD R FA  Final   Special Requests   Final    BOTTLES DRAWN AEROBIC AND ANAEROBIC Blood Culture results may not be optimal due to an excessive volume of blood received in culture bottles   Culture   Final    NO GROWTH 5 DAYS Performed at [facility], [address] [place], [place] [address]    Report Status 10/27/2018 FINAL  Final  SARS Coronavirus 2 [facility] order, Performed in [place] hospital lab) Nasopharyngeal Nasopharyngeal Swab     Status: None   Collection Time: 10/22/18 11:28 AM   Specimen: Nasopharyngeal Swab  Result Value Ref Range Status   SARS Coronavirus 2 NEGATIVE NEGATIVE Final    Comment: (NOTE) If result is NEGATIVE SARS-CoV-2 target nucleic acids are NOT DETECTED. The SARS-CoV-2 RNA is generally detectable in upper and lower  respiratory specimens during the acute phase of infection. The lowest  concentration of SARS-CoV-2 viral copies this assay can detect is 250  copies / mL. A negative result does not preclude SARS-CoV-2 infection  and should not be used as the sole basis for treatment or other  patient management decisions.  A negative result  may occur with  improper specimen collection / handling, submission of specimen other  than nasopharyngeal swab, presence of viral mutation(s) within the  areas targeted by this assay, and inadequate number of viral copies  (<250 copies / mL). A negative result must be combined with clinical  observations, patient history, and epidemiological information. If result is POSITIVE SARS-CoV-2 target nucleic acids are DETECTED. The SARS-CoV-2 RNA is generally detectable in upper and lower  respiratory specimens dur ing the acute phase of infection.  Positive  results are indicative of active infection with SARS-CoV-2.  Clinical  correlation with patient history and other diagnostic information is  necessary to determine patient infection status.  Positive results do  not rule out bacterial infection or co-infection with other viruses. If result is PRESUMPTIVE POSTIVE SARS-CoV-2 nucleic acids MAY BE PRESENT.   A presumptive positive result was obtained on the submitted specimen  and confirmed on repeat testing.  While 2019 novel coronavirus  (SARS-CoV-2) nucleic acids may be present in the submitted sample  additional confirmatory testing may be necessary for epidemiological  and / or clinical management purposes  to differentiate between  SARS-CoV-2 and other Sarbecovirus currently known to infect humans.  If clinically indicated additional testing with an alternate test  methodology [phone]([id number]) is advised. The SARS-CoV-2 RNA is generally  detectable in upper and lower respiratory sp ecimens during the acute  phase of infection. The expected result is Negative. Fact Sheet for Patients:  BoilerBrush.com.cyhttps://www.fda.gov/media/136312/download Fact Sheet for Healthcare Providers: https://pope.com/https://www.fda.gov/media/136313/download This test is not yet approved or cleared by the [place] FDA and has been authorized for detection and/or diagnosis of SARS-CoV-2 by FDA under an Emergency Use Authorization (EUA).   This EUA will remain in effect (meaning this test can be used) for the duration of the COVID-19 declaration under Section 564(b)(1) of the Act, 21 U.S.C. section 360bbb-3(b)(1), unless the authorization is terminated or revoked sooner. Performed at [facility], [address] [place], [place] [address]   Urine culture     Status: Abnormal   Collection Time: 10/22/18 12:08 PM   Specimen: Urine, Random  Result Value Ref Range Status   Specimen Description   Final    URINE, RANDOM Performed at [facility], [address] [place], [place] [address]    Special Requests   Final    NONE Performed at [facility], [address] [place], [place] [address]    Culture (A)  Final    <10,000 COLONIES/mL INSIGNIFICANT GROWTH Performed at [facility], [address] [place], [place] [address]    Report Status 10/23/2018 FINAL  Final  SARS Coronavirus 2 [facility](Hospital order, Performed in [facility] hospital lab) Nasopharyngeal Nasopharyngeal Swab     Status: None   Collection Time: 10/25/18  4:19 PM   Specimen: Nasopharyngeal Swab  Result Value Ref Range Status   SARS Coronavirus 2 NEGATIVE NEGATIVE Final    Comment: (NOTE) If result is NEGATIVE SARS-CoV-2 target nucleic acids are NOT DETECTED. The SARS-CoV-2 RNA is generally detectable in upper and lower  respiratory specimens during the acute phase of infection. The lowest  concentration of SARS-CoV-2 viral copies this assay can detect is 250  copies / mL. A negative result does not preclude SARS-CoV-2 infection  and should not be used as the sole basis for treatment or other  patient management decisions.  A negative result may occur with  improper specimen collection / handling, submission of specimen other  than nasopharyngeal swab, presence of viral mutation(s) within the  areas targeted by this assay, and inadequate number of viral copies  (<250 copies / mL). A negative result must be combined  with clinical  observations, patient history, and epidemiological information. If result is POSITIVE SARS-CoV-2 target nucleic acids are DETECTED. The SARS-CoV-2 RNA is generally detectable in upper and lower  respiratory specimens dur ing the acute phase of infection.  Positive  results are indicative of active infection with SARS-CoV-2.  Clinical  correlation with patient history and other diagnostic information is  necessary to determine patient infection status.  Positive results do  not rule out bacterial infection or co-infection with other viruses. If result is PRESUMPTIVE POSTIVE SARS-CoV-2 nucleic acids MAY BE PRESENT.   A presumptive positive result was obtained on the submitted specimen  and confirmed on repeat testing.  While 2019 novel coronavirus  (SARS-CoV-2) nucleic acids may be present in the submitted sample  additional confirmatory testing may be necessary for epidemiological  and / or clinical management purposes  to differentiate between  SARS-CoV-2 and other Sarbecovirus currently known to infect humans.  If clinically indicated additional testing with an alternate test  methodology [phone]) is advised. The SARS-CoV-2 RNA is generally  detectable in upper and lower respiratory sp ecimens during the acute  phase of infection. The expected result is Negative. Fact Sheet for Patients:  BoilerBrush.com.cy Fact Sheet for Healthcare Providers: https://pope.com/ This test is not yet approved or cleared by the [place] FDA and has been authorized for detection and/or diagnosis of SARS-CoV-2 by FDA under an Emergency Use Authorization (EUA).  This EUA will remain in effect (meaning this test can be used) for the duration of the COVID-19 declaration under Section 564(b)(1) of the Act, 21 U.S.C. section 360bbb-3(b)(1), unless the authorization is terminated or revoked sooner. 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 toxicity DDx includes infection typical/atypical (febrile on admission) +/- pulmonary edema (mildly elevated BNP) +/- pneumonitis (amiodarone induced vs. chronic aspiration).  Likely due to chronic aspiration, CT scan of the chest showed hiatal hernia and dilated esophagus with food throughout.  Encephalopathy  Toxic metabolic  discontinued steroids as can aggravate   PLAN/REC: Continue BiPAP as needed Continue supplemental oxygen to maintain SPO2 88-92% Diuresis as permitted by BP and renal function Continue to hold amiodarone for now (outpatient cardiologist aware) Discontinued methylprednisolone due to ongoing confusion issues/encephalopathy Cefepime and doxycycline stopped 08/21 and started unasyn due to possible aspiration  Trend WBC and monitor fever curve Up to chair as tolerated Incentive Spirometry ECHO overall unchanged.  DNR/DNI status  Care coordination performed with the patient's bedside nurse.  Wife updated at bedside.    Gailen Shelter, MD [place] [facility]

## 2018-10-30 NOTE — Progress Notes (Signed)
Patient was having pain of 9 in his ribs, RN gave him morphine at his request and then changed his gown and bedding.  Patient attempted to have a BM multiple times and then was successful on bedside commode.  Patient's meds must be crushed.  He hates apple sauce, so he tried strawberry jello.  Patient's wife visited and watched tv with him yesterday and today for a few hours per day.  Patient continues to have drops in his oxygen and then when repositioned and the high flow nasal prongs are repositioned he comes back up quickly.  Patient is confused and removes his O2 monitor clip frequently.  He also moves his nasal prongs for the high flow mask.     Phillis Knack, RN

## 2018-10-30 NOTE — Progress Notes (Signed)
At change of shift patient was trying to get out of bed, Pulled off gown, leads and IV. Administered 2 mg Haldol that was ineffective. Informed NP and ordered for safety sitter. Patient has been argumentative and verbally abusive with staff. NP spoke with patient but does not redirect easily. Called wife to speak with patient but patient still trying to get out of bed. Continue to monitor.

## 2018-10-30 NOTE — Progress Notes (Signed)
[place] [facility] [place] NAME: Darius Miller    MR#:  [id number]  DATE OF BIRTH:  Jun 06, 1945  SUBJECTIVE:   Transferred to ICU 8/19 due to increasing shortness of breath.  Patient is on high flow today with O2 saturations 90 to 91% with FIO2 80%.  Patient is alert and oriented  REVIEW OF SYSTEMS:  He denies any fever fatigue or weakness. He denies blurring of eye or double vision. Denies ringing in the ears. He denies any cough but has shortness of breath and feels comfortable with high flow nasal cannula. He denies chest pain, palpitation or edema. He denies abdominal pain, nausea, vomiting, diarrhea He denies any focal neurological weakness. He denies depression or suicidal ideation.  Tolerating Diet: yes    DRUG ALLERGIES:   Allergies  Allergen Reactions  . Flomax [Tamsulosin Hcl]     VITALS:  Blood pressure 128/78, pulse 77, temperature 98.4 F (36.9 C), temperature source Oral, resp. rate (!) 29, height 5\' 11"  (1.803 m), weight 74 kg, SpO2 99 %.  PHYSICAL EXAMINATION:  Constitutional: Appears well-developed and well-nourished. No distress. HENT: Normocephalic. Marland Kitchen Oropharynx is clear and moist.  Eyes: Conjunctivae and EOM are normal. PERRLA, no scleral icterus.  Neck: Normal ROM. Neck supple. No JVD. No tracheal deviation. CVS: RRR, S1/S2 +, no murmurs, no gallops, no carotid bruit.  Pulmonary: Decreased breath sounds throughout lung fields. Some crepitations.  High flow nasal cannula oxygen. Abdominal: Soft. BS +,  no distension, tenderness, rebound or guarding.  Musculoskeletal: Normal range of motion. No edema and no tenderness.  Neuro: Alert. CN 2-12 grossly intact. No focal deficits. Skin: Skin is warm and dry. No rash noted. Psychiatric: Alert and oriented x3.    LABORATORY PANEL:   CBC Recent Labs  Lab 10/29/18 0451  WBC 10.8*  HGB 11.8*  HCT 37.6*  PLT 302    ------------------------------------------------------------------------------------------------------------------  Chemistries  Recent Labs  Lab 10/30/18 0436  NA 146*  K 3.5  CL 106  CO2 32  GLUCOSE 120*  BUN 44*  CREATININE 0.99  CALCIUM 8.9  MG 2.3   ------------------------------------------------------------------------------------------------------------------  Cardiac Enzymes No results for input(s): TROPONINI in the last 168 hours. ------------------------------------------------------------------------------------------------------------------  RADIOLOGY:     ASSESSMENT AND PLAN:   73 year old male with PAF and CAD who presented after mechanical fall and suffered rib fracture.  1. . Acute respiratory failure, hypoxic: This is due to combination of atelectasis from rib fractures and pneumonia from atelectasis.  Unasyn for aspiration needed BiPAP CT chest yesterday showed no evidence of PE however does show pneumonia Less likely pulmonary edema Repeat covid 19 testing negative As per pulmonologist, esophagus is dilated in past studies, may be having aspirations repeatedly. They discussed with him and his wife and change him to DNR.  2..  Rib fractures: Patient is being treated conservatively.  Rib fractures are status post mechanical fall.  CT head and CT C-spine were negative for acute fracture. Continue incentive spirometer.  3.  PAF with history of sustained V. tach and ICD: Patient will continue on , metoprolol, Pradaxa and duloxetine Amiodarone is held due to concern of amiodarone toxicity  4.  CAD status post stents: Continue aspirin, metoprolol, and Lipitor.  5.  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  COPD without signs of exacerbation: Continue inhalers  7.  Essential hypertension: Patient will continue on metoprolol      Management plans discussed with the  patient and he is  in agreement.  CODE STATUS: full  TOTAL TIME TAKING CARE OF THIS PATIENT: 32 minutes.   POSSIBLE D/C 2-3 days, still on high flow nasal cannula oxygen.   Adrian Saran M.D on 10/24/2018 at 2:28 PM  Between 7am to 6pm - Pager - [phone]  After 6pm go to [email or web address] - password [email or web address]  [facility]  Office  [phone]  CC: Primary care physician; Gracelyn Nurse, MD  Note: This dictation was prepared with Dragon dictation along with smaller phrase technology. Any transcriptional errors that result from this process are unintentional.

## 2018-10-30 NOTE — Progress Notes (Signed)
Pharmacy Electrolyte Monitoring Consult:  Pharmacy consulted to assist in monitoring and replacing electrolytes in this 73 y.o. male admitted on 10/22/2018. Patient being treated for acute respiratory failure secondary to pneumonitis secondary to chronic aspiration.   Labs:  Sodium (mmol/L)  Date Value  10/30/2018 146 (H)   Potassium (mmol/L)  Date Value  10/30/2018 3.5   Magnesium (mg/dL)  Date Value  10/30/2018 2.3   Calcium (mg/dL)  Date Value  10/30/2018 8.9   Albumin (g/dL)  Date Value  10/22/2018 3.7    Assessment/Plan: KCL 40 meq PO x 1.  Furosemide 40mg  PO Daily continued.   Due to extensive cardiac issues, will replace for goal potassium ~ 4 and goal magnesium ~ 2.   Pharmacy will continue to monitor and adjust per consult.   Olivia Canter [facility] Clinical Pharmacist 10/30/2018 9:54 AM

## 2018-10-31 LAB — BASIC METABOLIC PANEL
Anion gap: 11 (ref 5–15)
BUN: 27 mg/dL — ABNORMAL HIGH (ref 8–23)
CO2: 30 mmol/L (ref 22–32)
Calcium: 8.8 mg/dL — ABNORMAL LOW (ref 8.9–10.3)
Chloride: 105 mmol/L (ref 98–111)
Creatinine, Ser: 0.78 mg/dL (ref 0.61–1.24)
GFR calc Af Amer: >60 mL/min (ref >60)
GFR calc non Af Amer: >60 mL/min (ref >60)
Glucose, Bld: 100 mg/dL — ABNORMAL HIGH (ref 70–99)
Potassium: 3.5 mmol/L (ref 3.5–5.1)
Sodium: 146 mmol/L — ABNORMAL HIGH (ref 135–145)

## 2018-10-31 LAB — MAGNESIUM: Magnesium: 1.9 mg/dL (ref 1.7–2.4)

## 2018-10-31 LAB — MRSA PCR SCREENING: MRSA by PCR: NEGATIVE

## 2018-10-31 MED ORDER — POTASSIUM CHLORIDE CRYS ER 20 MEQ PO TBCR
40.0000 meq | EXTENDED_RELEASE_TABLET | Freq: Once | ORAL | Status: AC
  Administered 2018-10-31: 40 meq via ORAL
  Filled 2018-10-31: qty 2

## 2018-10-31 MED ORDER — CHLORHEXIDINE GLUCONATE CLOTH 2 % EX PADS
6.0000 | MEDICATED_PAD | Freq: Every day | CUTANEOUS | Status: DC
  Administered 2018-11-02 – 2018-11-06 (×4): 6 via TOPICAL

## 2018-10-31 MED ORDER — MAGNESIUM OXIDE 400 (241.3 MG) MG PO TABS
400.0000 mg | ORAL_TABLET | Freq: Two times a day (BID) | ORAL | Status: DC
  Administered 2018-10-31 – 2018-11-09 (×18): 400 mg via ORAL
  Filled 2018-10-31 (×18): qty 1

## 2018-10-31 MED ORDER — SODIUM CHLORIDE 0.9 % IV SOLN
3.0000 g | Freq: Four times a day (QID) | INTRAVENOUS | Status: AC
  Administered 2018-10-31 – 2018-11-01 (×4): 3 g via INTRAVENOUS
  Filled 2018-10-31: qty 8
  Filled 2018-10-31 (×3): qty 3

## 2018-10-31 NOTE — Progress Notes (Signed)
Pharmacy Electrolyte Monitoring Consult:  Pharmacy consulted to assist in monitoring and replacing electrolytes in this 73 y.o. male admitted on 10/22/2018 with hypoxemic respiratory failure and respiratory distress secondary to pneumonitis secondary to chronic silent aspiration. 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 History significant for atrial fibrillation, coronary artery disease s/p RCA stents, mitral valve repair, ventricular tachycardia, stroke, and kidney stones. Patient was on antibiotics when pneumonia was suspected but will finish antibiotic therapy tomorrow.    Labs:  Sodium (mmol/L)  Date Value  10/31/2018 146 (H)   Potassium (mmol/L)  Date Value  10/31/2018 3.5   Magnesium (mg/dL)  Date Value  10/31/2018 1.9   Calcium (mg/dL)  Date Value  10/31/2018 8.8 (L)   Albumin (g/dL)  Date Value  10/22/2018 3.7   Corrected calcium: 9.04  Assessment/Plan: Potassium chloride 40 mEq x 1 dose. Magnesium 400mg  PO BID. Furosemide 40mg  PO Daily continued.   Due to extensive cardiac issues, will replace for goal potassium ~ 4 and goal magnesium ~ 2.   Will obtain BMP/Magnesium with am labs.   Pharmacy will continue to monitor and adjust per consult.   Laylani Pudwill Dear [name] 10/31/2018 2:00 PM

## 2018-10-31 NOTE — Progress Notes (Signed)
[facility] [facility] [place] NAME: Darius Miller    MR#:  [id number]  DATE OF BIRTH:  [birth year]  SUBJECTIVE:   Transferred to ICU 8/19 due to increasing shortness of breath.  Patient is on high flow today with O2 saturations 90 to 91% with FIO2 80%.  Patient is alert and oriented.  REVIEW OF SYSTEMS:  He denies any fever fatigue or weakness. He denies blurring of eye or double vision. Denies ringing in the ears. He denies any cough but has shortness of breath and feels comfortable with high flow nasal cannula. He denies chest pain, palpitation or edema. He denies abdominal pain, nausea, vomiting, diarrhea He denies any focal neurological weakness. He denies depression or suicidal ideation.  Tolerating Diet: yes    DRUG ALLERGIES:   Allergies  Allergen Reactions  . Flomax [Tamsulosin Hcl]     VITALS:  Blood pressure 102/67, pulse 79, temperature 97.9 F (36.6 C), temperature source Oral, resp. rate (!) 21, height 5\' 11"  (1.803 m), weight 74 kg, SpO2 (!) 87 %.  PHYSICAL EXAMINATION:   Constitutional: Appears well-developed and well-nourished. No distress. HENT: Normocephalic. Marland Kitchen Oropharynx is clear and moist.  Eyes: Conjunctivae and EOM are normal. PERRLA, no scleral icterus.  Neck: Normal ROM. Neck supple. No JVD. No tracheal deviation. CVS: RRR, S1/S2 +, no murmurs, no gallops, no carotid bruit.  Pulmonary: Decreased breath sounds throughout lung fields. Some crepitations.  High flow nasal cannula oxygen. Abdominal: Soft. BS +,  no distension, tenderness, rebound or guarding.  Musculoskeletal: Normal range of motion. No edema and no tenderness.  Neuro: Alert. CN 2-12 grossly intact. No focal deficits. Skin: Skin is warm and dry. No rash noted. Psychiatric: Alert and oriented x3.  LABORATORY PANEL:   CBC Recent Labs  Lab 10/29/18 0451  WBC 10.8*  HGB 11.8*  HCT 37.6*  PLT 302    ------------------------------------------------------------------------------------------------------------------  Chemistries  Recent Labs  Lab 10/31/18 0355  NA 146*  K 3.5  CL 105  CO2 30  GLUCOSE 100*  BUN 27*  CREATININE 0.78  CALCIUM 8.8*  MG 1.9   ------------------------------------------------------------------------------------------------------------------  Cardiac Enzymes No results for input(s): TROPONINI in the last 168 hours. ------------------------------------------------------------------------------------------------------------------  RADIOLOGY:     ASSESSMENT AND PLAN:   73 year old male with PAF and CAD who presented after mechanical fall and suffered rib fracture.  1. . Acute respiratory failure, hypoxic: This is due to combination of atelectasis from rib fractures and pneumonia from atelectasis.  Unasyn for aspiration needed BiPAP CT chest showed no evidence of PE however does show pneumonia Less likely pulmonary edema Repeat covid 19 testing negative As per pulmonologist, esophagus is dilated in past studies, may be having aspirations repeatedly. Intencivist discussed with him and his wife and change him to DNR.  2..  Rib fractures: Patient is being treated conservatively.  Rib fractures are status post mechanical fall.  CT head and CT C-spine were negative for acute fracture. Continue incentive spirometer.  3.  PAF with history of sustained V. tach and ICD: Patient will continue on , metoprolol, Pradaxa and duloxetine Amiodarone is held due to concern of amiodarone toxicity  4.  CAD status post stents: Continue aspirin, metoprolol, and Lipitor.  5.  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  COPD without signs of exacerbation: Continue inhalers  7.  Essential hypertension: Patient will continue on metoprolol     Management plans discussed with the patient and  he is in  agreement.  CODE STATUS: full  TOTAL TIME TAKING CARE OF THIS PATIENT: 32 minutes.   POSSIBLE D/C 2-3 days, still on high flow nasal cannula oxygen.   Adrian Saran M.D on 10/24/2018 at 3:39 PM  Between 7am to 6pm - Pager - [phone]  After 6pm go to [email or web address] - password [email or web address]  [facility]  Office  [phone]  CC: Primary care physician; Gracelyn Nurse, MD  Note: This dictation was prepared with Dragon dictation along with smaller phrase technology. Any transcriptional errors that result from this process are unintentional.

## 2018-10-31 NOTE — Progress Notes (Signed)
Pharmacy Antibiotic Note  Darius Miller is a 73 y.o. male admitted on 10/22/2018 with pneumonia. Patient initially given ceftriaxone and azithromycin at the ED. 8/15 EKG showed QTc = 510 ms. Azithromycin transitioned to doxycyline on 8/19. Ceftriaxone transitioned to cefepime on 8/19.  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 Doxycycline was also discontinued. Cefepime is being transitioned to Unasyn for aspiration pneumonia. Pharmacy has been consulted for Unasyn dosing.  Plan: Per AM rounds, continue Unasyn 3g Q6H for 1 more day and discontinue on 8/25.  Height: 5\' 11"  (180.3 cm) Weight: 163 lb 2.3 oz (74 kg) IBW/kg (Calculated) : 75.3  Temp (24hrs), Avg:98.3 F (36.8 C), Min:97.9 F (36.6 C), Max:98.8 F (37.1 C)  Recent Labs  Lab 10/27/18 0434 10/28/18 0341 10/29/18 0451 10/30/18 0436 10/31/18 0355  WBC 8.8 12.8* 10.8*  --   --   CREATININE 0.88 1.09 1.16 0.99 0.78    Estimated Creatinine Clearance: 87.4 mL/min (by C-G formula based on SCr of 0.78 mg/dL).    Allergies  Allergen Reactions  . Flomax [Tamsulosin Hcl]     Antimicrobials this admission: azithromycin 8/15 >> 8/17 ceftriaxone 8/15 >> 8/17 cefepime 8/19 >> 08/21 doxycycline 8/19 >> 08/21 Unasyn 08/21 >> 08/25  Microbiology results: 08/15 BCx: no growth  08/15 UCx: insignificant growth 08/24 MRSA PCR: pending  Thank you for allowing pharmacy to be a part of this patient's care.  Darius Miller Darius Miller 10/31/2018 1:45 PM

## 2018-10-31 NOTE — Progress Notes (Signed)
CRITICAL CARE PROGRESS NOTE    Name: Darius Miller MRN: [id number] DOB: 07-02-1945     LOS: 6   SUBJECTIVE FINDINGS & SIGNIFICANT EVENTS   Patient description:  Pt admitted 08/15 s/p fall resulting in a rib fracture transferred to ICU 08/19 with worsening hypoxemic respiratory failure and respiratory distress.  He was placed on BiPAP.  Chest x-ray reveals patchy bilateral infiltrates.  CT of chest from 8/18 reveals patchy, predominantly interstitial infiltrates in both lungs likely pneumonitis in the setting of chronic aspiration although there is concern this could be amiodarone induced   Lines / Drains: PIV  Cultures / Sepsis markers: Blood cx neg up to date , procalcitonin mildly elevated .6  Antibiotics: 10/22/2018: ED s/p fall. 10/23/2018: Continued to be febrile. Required O2 via [place]. CSW with recommendation to transfer to SNF - [facility] 10/25/2018: Plan was to discharge to [facility] today. He was hypoxic (60's on 2L oxygen). Started on high flow. 1 dose Lasix given. [facility] came to pick him up. Patient at that time was on 10 L high flow with SpO2 @ 86%. Kept in hospital.  BNP baseline 346.0. CXR completed --> stable cardiomegaly, bronchovascular crowding and vascular congestion, and bilateral calcified pleural plaques.  10/26/18: On High Flow - to 55L @ 100% FiO2. Started on Cefepime.  9 AM patient on HFNC 55 lpm @ 100% - with O2 saturation in the high 80's, and was lethargic. Patient admitted to ICU for further management. 10/28/18: . Pt and pts wife states after pt eats he has frequent episodes of coughing and he also endorses problems with acid reflux.  Therefore, cefepime and doxycycline stopped and started unasyn for aspiration  10/30/18: Tolerating 40L @ 60%    Overnight: Combative  behavior, patient attempted to get OOB and pull out drains. Has sitter.    PAST MEDICAL HISTORY   Past Medical History:  Diagnosis Date  . Atrial fibrillation ([place])   . Coronary artery disease  s/p RCA stents   . History of mitral valve repair   . Hypertension   . 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 Kidney stones   . Stroke ([place])   . Ventricular tachycardia ([place])      SURGICAL HISTORY   Past Surgical History:  Procedure Laterality Date  . CARDIAC CATHETERIZATION    . CORONARY ANGIOPLASTY    . Coronary stent 2009  2009  . ICD IMPLANT    . MITRAL VALVE REPAIR  October 16, 2014     FAMILY HISTORY   History reviewed. No pertinent family history.   SOCIAL HISTORY   Social History   Tobacco Use  . Smoking status: Former Smoker    Packs/day: 2.00    Years: 20.00    Pack years: 40.00    Types: Cigarettes  . Smokeless tobacco: Never Used  Substance Use Topics  . Alcohol use: Not on file  . Drug use: Not on file     MEDICATIONS   Current Medication:  Current Facility-Administered Medications:  .  acetaminophen (TYLENOL) tablet 650 mg, 650 mg, Oral, Q6H PRN, 650 mg at 10/22/18 2100 **OR** acetaminophen (TYLENOL) suppository 650 mg, 650 mg, Rectal, Q6H PRN, Mayo, Pete Pelt, MD .  Ampicillin-Sulbactam (UNASYN) 3 g in sodium chloride 0.9 % 100 mL IVPB, 3 g, Intravenous, Q6H, Charlett Nose, RPH, Last Rate: 200 mL/hr at 10/31/18 0636, 3 g at 10/31/18 0636 .  aspirin EC tablet 81 mg, 81 mg,  Oral, Daily, Albina BilletShanlever, Charles M, [place], 81 mg at 10/31/18 [id number] .  atorvastatin (LIPITOR) tablet 20 mg, 20 mg, Oral, Daily, Mayo, Allyn KennerKaty Dodd, MD, 20 mg at 10/31/18 0931 .  bacitracin ointment, , Topical, Daily, Mayo, Allyn KennerKaty Dodd, MD, 1 application at 10/30/18 1245 .  dabigatran (PRADAXA) capsule 150 mg, 150 mg, Oral, BID, Mayo, Allyn KennerKaty Dodd, MD, 150 mg at 10/31/18 0930 .  furosemide (LASIX) tablet 40 mg, 40 mg, Oral, Daily, Mody,  Sital, MD, 40 mg at 10/31/18 0930 .  haloperidol lactate (HALDOL) injection 2 mg, 2 mg, Intravenous, Q6H PRN, Harlon DittyKeene, Jeremiah D, NP, 2 mg at 10/30/18 2000 .  ipratropium-albuterol (DUONEB) 0.5-2.5 (3) MG/3ML nebulizer solution 3 mL, 3 mL, Nebulization, Q4H PRN, Mansy, Jan A, MD .  metoprolol succinate (TOPROL-XL) 24 hr tablet 12.5 mg, 12.5 mg, Oral, BID, Mayo, Allyn KennerKaty Dodd, MD, 12.5 mg at 10/31/18 0931 .  mexiletine (MEXITIL) capsule 200 mg, 200 mg, Oral, Q12H, Mayo, Allyn KennerKaty Dodd, MD, 200 mg at 10/31/18 0930 .  morphine 2 MG/ML injection 2 mg, 2 mg, Intravenous, Q3H PRN, Harlon DittyKeene, Jeremiah D, NP, 2 mg at 10/30/18 2136 .  [DISCONTINUED] ondansetron (ZOFRAN) tablet 4 mg, 4 mg, Oral, Q6H PRN **OR** ondansetron (ZOFRAN) injection 4 mg, 4 mg, Intravenous, Q6H PRN, Mayo, Allyn KennerKaty Dodd, MD .  oxyCODONE (Oxy IR/ROXICODONE) immediate release tablet 5 mg, 5 mg, Oral, Q4H PRN, Mayo, Allyn KennerKaty Dodd, MD, 5 mg at 10/29/18 0130 .  phenol (CHLORASEPTIC) mouth spray 1 spray, 1 spray, Mouth/Throat, PRN, Mody, Sital, MD .  polyethylene glycol (MIRALAX / GLYCOLAX) packet 17 g, 17 g, Oral, Daily PRN, Mayo, Allyn KennerKaty Dodd, MD, 17 g at 10/24/18 1357 .  venlafaxine (EFFEXOR) tablet 37.5 mg, 37.5 mg, Oral, BID WC, Mayo, Allyn KennerKaty Dodd, MD, 37.5 mg at 10/31/18 0931    ALLERGIES   Flomax [tamsulosin hcl]    REVIEW OF SYSTEMS    10 point ROS done and is positive for dyspnea at rest   PHYSICAL EXAMINATION   Vital Signs: Temp:  [98.1 F (36.7 C)-98.8 F (37.1 C)] 98.2 F (36.8 C) (08/24 0800) Pulse Rate:  [66-132] 70 (08/24 1000) Resp:  [16-28] 16 (08/24 1000) BP: (122-144)/(74-93) 144/89 (08/24 1000) SpO2:  [70 %-99 %] 91 % (08/24 1000) FiO2 (%):  [65 %-80 %] 80 % (08/24 0726)  GENERAL:mild distress due to dyspnea HEAD: Normocephalic, atraumatic.  EYES: Pupils equal, round, reactive to light.  No scleral icterus.  MOUTH: Moist mucosal membrane. NECK: Supple. No thyromegaly. No nodules. No JVD.  PULMONARY: bibasilar crackles  CARDIOVASCULAR: S1 and S2. Regular rate and rhythm. No murmurs, rubs, or gallops.  GASTROINTESTINAL: Soft, nontender, non-distended. No masses. Positive bowel sounds. No hepatosplenomegaly.  MUSCULOSKELETAL: No swelling, clubbing, or edema.  NEUROLOGIC: Mild distress due to acute illness SKIN:intact,warm,dry   PERTINENT DATA     Infusions: . ampicillin-sulbactam (UNASYN) IV 3 g (10/31/18 0636)   Scheduled Medications: . aspirin EC  81 mg Oral Daily  . atorvastatin  20 mg Oral Daily  . bacitracin   Topical Daily  . dabigatran  150 mg Oral BID  . furosemide  40 mg Oral Daily  . metoprolol succinate  12.5 mg Oral BID  . mexiletine  200 mg Oral Q12H  . venlafaxine  37.5 mg Oral BID WC   PRN Medications: acetaminophen **OR** acetaminophen, haloperidol lactate, ipratropium-albuterol, morphine injection, [DISCONTINUED] ondansetron **OR** ondansetron (ZOFRAN) IV, oxyCODONE, phenol, polyethylene glycol Hemodynamic parameters:   Intake/Output: 08/23 0701 - 08/24 0700 In: 240 [P.O.:240] Out: 1800 [Urine:1800]  Ventilator  Settings: FiO2 (%):  [65 %-80 %] 80 %   Other Labs:     LAB RESULTS:  Basic Metabolic Panel: Recent Labs  Lab 10/27/18 0434 10/28/18 0341 10/29/18 0451 10/30/18 0436 10/31/18 0355  NA 139 143 145 146* 146*  K 4.1 4.5 4.4 3.5 3.5  CL 104 107 106 106 105  CO2 31 28 30  32 30  GLUCOSE 154* 120* 124* 120* 100*  BUN 32* 48* 50* 44* 27*  CREATININE 0.88 1.09 1.16 0.99 0.78  CALCIUM 8.8* 9.2 9.2 8.9 8.8*  MG  --  2.3 2.4 2.3 1.9   Liver Function Tests: No results for input(s): AST, ALT, ALKPHOS, BILITOT, PROT, ALBUMIN in the last 168 hours. No results for input(s): LIPASE, AMYLASE in the last 168 hours. No results for input(s): AMMONIA in the last 168 hours. CBC: Recent Labs  Lab 10/27/18 0434 10/28/18 0341 10/29/18 0451  WBC 8.8 12.8* 10.8*  NEUTROABS  --  11.4* 9.6*  HGB 11.3* 12.3* 11.8*  HCT 34.8* 39.0 37.6*  MCV 93.0 95.1 95.2  PLT 213  268 302   Cardiac Enzymes: No results for input(s): CKTOTAL, CKMB, CKMBINDEX, TROPONINI in the last 168 hours. BNP: Invalid input(s): POCBNP CBG: Recent Labs  Lab 10/26/18 1011 10/28/18 2324  GLUCAP 148* 127*     IMAGING RESULTS:  Imaging: No results found. @PROBHOSP @   ASSESSMENT AND PLAN    -Multidisciplinary rounds held today  Acute Hypoxic Respiratory Failure -procalcitonin mildly elevated  -patient on chronic amiodarone 400 bid  -s/p 5L diuresis improved  - possible CHF exacerbation as patient had improvement in BNP and clinically post diuresis -patient and wife share multiple aspiration events sometime  "3 times per month".    Acute on chronic systolic CHF exacerbation -oxygen as needed -Lasix as tolerated -follow up cardiac enzymes as indicated ICU monitoring   ID -continue IV abx as prescibed -follow up cultures  GI/Nutrition GI PROPHYLAXIS as indicated DIET-->TF's as tolerated Constipation protocol as indicated  ENDO - ICU hypoglycemic\Hyperglycemia protocol -check FSBS per protocol   ELECTROLYTES -follow labs as needed -replace as needed -pharmacy consultation   DVT/GI PRX ordered -SCDs  TRANSFUSIONS AS NEEDED MONITOR FSBS ASSESS the need for LABS as needed   Critical care provider statement:    Critical care time (minutes):  33   Critical care time was exclusive of:  Separately billable procedures and treating other patients   Critical care was necessary to treat or prevent imminent or life-threatening deterioration of the following conditions:  Acute hypoxemic respiratory failure, acute CHF exacerbation , pulmonary pneumonitis, multiple comorbid conditions   Critical care was time spent personally by me on the following activities:  Development of treatment plan with patient or surrogate, discussions with consultants, evaluation of patient's response to treatment, examination of patient, obtaining history from patient or surrogate,  ordering and performing treatments and interventions, ordering and review of laboratory studies and re-evaluation of patient's condition.  I assumed direction of critical care for this patient from another provider in my specialty: no    This document was prepared using Dragon voice recognition software and may include unintentional dictation errors.    Vida Rigger, M.D.  Division of Pulmonary & Critical Care Medicine  [facility]

## 2018-11-01 ENCOUNTER — Inpatient Hospital Stay: Payer: Medicare HMO

## 2018-11-01 LAB — CBC WITH DIFFERENTIAL/PLATELET
Abs Immature Granulocytes: 0.18 10*3/uL — ABNORMAL HIGH (ref 0.00–0.07)
Basophils Absolute: 0 10*3/uL (ref 0.0–0.1)
Basophils Relative: 0 %
Eosinophils Absolute: 0.9 10*3/uL — ABNORMAL HIGH (ref 0.0–0.5)
Eosinophils Relative: 6 %
HCT: 38 % — ABNORMAL LOW (ref 39.0–52.0)
Hemoglobin: 12.2 g/dL — ABNORMAL LOW (ref 13.0–17.0)
Immature Granulocytes: 1 %
Lymphocytes Relative: 5 %
Lymphs Abs: 0.8 10*3/uL (ref 0.7–4.0)
MCH: 30.4 pg (ref 26.0–34.0)
MCHC: 32.1 g/dL (ref 30.0–36.0)
MCV: 94.8 fL (ref 80.0–100.0)
Monocytes Absolute: 0.4 10*3/uL (ref 0.1–1.0)
Monocytes Relative: 3 %
Neutro Abs: 12.1 10*3/uL — ABNORMAL HIGH (ref 1.7–7.7)
Neutrophils Relative %: 85 %
Platelets: 249 10*3/uL (ref 150–400)
RBC: 4.01 MIL/uL — ABNORMAL LOW (ref 4.22–5.81)
RDW: 14.9 % (ref 11.5–15.5)
WBC: 14.4 10*3/uL — ABNORMAL HIGH (ref 4.0–10.5)
nRBC: 0 % (ref 0.0–0.2)

## 2018-11-01 LAB — BASIC METABOLIC PANEL
Anion gap: 11 (ref 5–15)
BUN: 21 mg/dL (ref 8–23)
CO2: 29 mmol/L (ref 22–32)
Calcium: 8.6 mg/dL — ABNORMAL LOW (ref 8.9–10.3)
Chloride: 103 mmol/L (ref 98–111)
Creatinine, Ser: 0.68 mg/dL (ref 0.61–1.24)
GFR calc Af Amer: >60 mL/min (ref >60)
GFR calc non Af Amer: >60 mL/min (ref >60)
Glucose, Bld: 108 mg/dL — ABNORMAL HIGH (ref 70–99)
Potassium: 3.6 mmol/L (ref 3.5–5.1)
Sodium: 143 mmol/L (ref 135–145)

## 2018-11-01 LAB — ABO/RH: ABO/RH(D): A POS

## 2018-11-01 LAB — MAGNESIUM: Magnesium: 1.9 mg/dL (ref 1.7–2.4)

## 2018-11-01 LAB — SARS CORONAVIRUS 2 BY RT PCR (HOSPITAL ORDER, PERFORMED IN ~~LOC~~ HOSPITAL LAB): SARS Coronavirus 2: NEGATIVE

## 2018-11-01 MED ORDER — METHYLPREDNISOLONE SODIUM SUCC 125 MG IJ SOLR
60.0000 mg | Freq: Two times a day (BID) | INTRAMUSCULAR | Status: DC
  Administered 2018-11-01 – 2018-11-02 (×3): 60 mg via INTRAVENOUS
  Filled 2018-11-01 (×3): qty 2

## 2018-11-01 MED ORDER — CHLORHEXIDINE GLUCONATE 0.12 % MT SOLN
15.0000 mL | Freq: Two times a day (BID) | OROMUCOSAL | Status: DC
  Administered 2018-11-01 – 2018-11-03 (×5): 15 mL via OROMUCOSAL
  Filled 2018-11-01 (×4): qty 15

## 2018-11-01 MED ORDER — ORAL CARE MOUTH RINSE
15.0000 mL | Freq: Two times a day (BID) | OROMUCOSAL | Status: DC
  Administered 2018-11-01 – 2018-11-02 (×4): 15 mL via OROMUCOSAL

## 2018-11-01 MED ORDER — POTASSIUM CHLORIDE CRYS ER 20 MEQ PO TBCR
40.0000 meq | EXTENDED_RELEASE_TABLET | Freq: Once | ORAL | Status: AC
  Administered 2018-11-01: 40 meq via ORAL
  Filled 2018-11-01: qty 2

## 2018-11-01 NOTE — Progress Notes (Signed)
CRITICAL CARE PROGRESS NOTE    Name: Darius Miller MRN: [id number] DOB: 1945/09/01     LOS: 7   SUBJECTIVE FINDINGS & SIGNIFICANT EVENTS   Patient description:  Pt admitted 08/15 s/p fall resulting in a rib fracture transferred to ICU 08/19 with worsening hypoxemic respiratory failure and respiratory distress.  He was placed on BiPAP.  Chest x-ray reveals patchy bilateral infiltrates.  CT of chest from 8/18 reveals patchy, predominantly interstitial infiltrates in both lungs likely pneumonitis in the setting of chronic aspiration although there is concern this could be amiodarone induced   Lines / Drains: PIV  Cultures / Sepsis markers: Blood cx neg up to date , procalcitonin mildly elevated .6  Antibiotics: 10/22/2018: ED s/p fall. 10/23/2018: Continued to be febrile. Required O2 via [place]. CSW with recommendation to transfer to SNF - [facility] 10/25/2018: Plan was to discharge to [facility] today. He was hypoxic (60's on 2L oxygen). Started on high flow. 1 dose Lasix given. [facility] came to pick him up. Patient at that time was on 10 L high flow with SpO2 @ 86%. Kept in hospital.  BNP baseline 346.0. CXR completed --> stable cardiomegaly, bronchovascular crowding and vascular congestion, and bilateral calcified pleural plaques.  10/26/18: On High Flow - to 55L @ 100% FiO2. Started on Cefepime.  9 AM patient on HFNC 55 lpm @ 100% - with O2 saturation in the high 80's, and was lethargic. Patient admitted to ICU for further management. 10/28/18: . Pt and pts wife states after pt eats he has frequent episodes of coughing and he also endorses problems with acid reflux.  Therefore, cefepime and doxycycline stopped and started unasyn for aspiration  10/30/18: Tolerating 40L @ 60%    Overnight:   Discussed case with wife at bedside , patient still requires 75% FiO2 on HFNC , she does note some clinical improvement.   We started steroids for poss amio tox   PAST MEDICAL HISTORY   Past Medical History:  Diagnosis Date  . Atrial fibrillation ([place])   . Coronary artery disease  s/p RCA stents   . History of mitral valve repair   . Hypertension   . 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 Kidney stones   . Stroke ([place])   . Ventricular tachycardia ([place])      SURGICAL HISTORY   Past Surgical History:  Procedure Laterality Date  . CARDIAC CATHETERIZATION    . CORONARY ANGIOPLASTY    . Coronary stent 2009  2009  . ICD IMPLANT    . MITRAL VALVE REPAIR  October 16, 2014     FAMILY HISTORY   History reviewed. No pertinent family history.   SOCIAL HISTORY   Social History   Tobacco Use  . Smoking status: Former Smoker    Packs/day: 2.00    Years: 20.00    Pack years: 40.00    Types: Cigarettes  . Smokeless tobacco: Never Used  Substance Use Topics  . Alcohol use: Not on file  . Drug use: Not on file     MEDICATIONS   Current Medication:  Current Facility-Administered Medications:  .  acetaminophen (TYLENOL) tablet 650 mg, 650 mg, Oral, Q6H PRN, 650 mg at 10/22/18 2100 **OR** acetaminophen (TYLENOL) suppository 650 mg, 650 mg, Rectal, Q6H PRN, Mayo, Pete Pelt, MD .  aspirin EC tablet 81 mg, 81 mg, Oral, Daily, Lu Duffel, RPH, 81 mg at 11/01/18 3500 .  atorvastatin (LIPITOR) tablet 20  mg, 20 mg, Oral, Daily, Mayo, Allyn KennerKaty Dodd, MD, 20 mg at 11/01/18 0925 .  bacitracin ointment, , Topical, Daily, Mayo, Allyn KennerKaty Dodd, MD, 1 application at 11/01/18 1214 .  chlorhexidine (PERIDEX) 0.12 % solution 15 mL, 15 mL, Mouth Rinse, BID, Karna ChristmasAleskerov, Rozell Kettlewell, MD, 15 mL at 11/01/18 0751 .  Chlorhexidine Gluconate Cloth 2 % PADS 6 each, 6 each, Topical, Daily, Haidar Muse, MD .  dabigatran (PRADAXA) capsule 150 mg, 150 mg,  Oral, BID, Mayo, Allyn KennerKaty Dodd, MD, 150 mg at 11/01/18 0925 .  furosemide (LASIX) tablet 40 mg, 40 mg, Oral, Daily, Mody, Sital, MD, 40 mg at 11/01/18 0925 .  haloperidol lactate (HALDOL) injection 2 mg, 2 mg, Intravenous, Q6H PRN, Harlon DittyKeene, Jeremiah D, NP, 2 mg at 10/30/18 2000 .  ipratropium-albuterol (DUONEB) 0.5-2.5 (3) MG/3ML nebulizer solution 3 mL, 3 mL, Nebulization, Q4H PRN, Mansy, Jan A, MD .  magnesium oxide (MAG-OX) tablet 400 mg, 400 mg, Oral, BID, Bertram SavinSimpson, Michael L, RPH, 400 mg at 11/01/18 [id number] .  MEDLINE mouth rinse, 15 mL, Mouth Rinse, q12n4p, Vanellope Passmore, MD, 15 mL at 11/01/18 1659 .  methylPREDNISolone sodium succinate (SOLU-MEDROL) 125 mg/2 mL injection 60 mg, 60 mg, Intravenous, Q12H, Karna ChristmasAleskerov, Lynee Rosenbach, MD, 60 mg at 11/01/18 1125 .  metoprolol succinate (TOPROL-XL) 24 hr tablet 12.5 mg, 12.5 mg, Oral, BID, Mayo, Allyn KennerKaty Dodd, MD, 12.5 mg at 10/31/18 2128 .  mexiletine (MEXITIL) capsule 200 mg, 200 mg, Oral, Q12H, Mayo, Allyn KennerKaty Dodd, MD, 200 mg at 11/01/18 0925 .  morphine 2 MG/ML injection 2 mg, 2 mg, Intravenous, Q3H PRN, Harlon DittyKeene, Jeremiah D, NP, 2 mg at 11/01/18 1010 .  [DISCONTINUED] ondansetron (ZOFRAN) tablet 4 mg, 4 mg, Oral, Q6H PRN **OR** ondansetron (ZOFRAN) injection 4 mg, 4 mg, Intravenous, Q6H PRN, Mayo, Allyn KennerKaty Dodd, MD .  oxyCODONE (Oxy IR/ROXICODONE) immediate release tablet 5 mg, 5 mg, Oral, Q4H PRN, Mayo, Allyn KennerKaty Dodd, MD, 5 mg at 10/29/18 0130 .  phenol (CHLORASEPTIC) mouth spray 1 spray, 1 spray, Mouth/Throat, PRN, Mody, Sital, MD .  polyethylene glycol (MIRALAX / GLYCOLAX) packet 17 g, 17 g, Oral, Daily PRN, Mayo, Allyn KennerKaty Dodd, MD, 17 g at 10/24/18 1357 .  venlafaxine (EFFEXOR) tablet 37.5 mg, 37.5 mg, Oral, BID WC, Mayo, Allyn KennerKaty Dodd, MD, 37.5 mg at 11/01/18 1659    ALLERGIES   Flomax [tamsulosin hcl]    REVIEW OF SYSTEMS    10 point ROS done and is positive for dyspnea at rest   PHYSICAL EXAMINATION   Vital Signs: Temp:  [98.2 F (36.8 C)-98.9 F (37.2 C)] 98.2 F  (36.8 C) (08/25 1659) Pulse Rate:  [62-83] 79 (08/25 1659) Resp:  [18-24] 20 (08/25 1659) BP: (82-125)/(65-99) 125/75 (08/25 1600) SpO2:  [84 %-100 %] 91 % (08/25 1659) FiO2 (%):  [75 %] 75 % (08/25 1551)  GENERAL:mild distress due to dyspnea HEAD: Normocephalic, atraumatic.  EYES: Pupils equal, round, reactive to light.  No scleral icterus.  MOUTH: Moist mucosal membrane. NECK: Supple. No thyromegaly. No nodules. No JVD.  PULMONARY: bibasilar crackles CARDIOVASCULAR: S1 and S2. Regular rate and rhythm. No murmurs, rubs, or gallops.  GASTROINTESTINAL: Soft, nontender, non-distended. No masses. Positive bowel sounds. No hepatosplenomegaly.  MUSCULOSKELETAL: No swelling, clubbing, or edema.  NEUROLOGIC: Mild distress due to acute illness SKIN:intact,warm,dry   PERTINENT DATA     Infusions:  Scheduled Medications: . aspirin EC  81 mg Oral Daily  . atorvastatin  20 mg Oral Daily  . bacitracin   Topical Daily  . chlorhexidine  15 mL  Mouth Rinse BID  . Chlorhexidine Gluconate Cloth  6 each Topical Daily  . dabigatran  150 mg Oral BID  . furosemide  40 mg Oral Daily  . magnesium oxide  400 mg Oral BID  . mouth rinse  15 mL Mouth Rinse q12n4p  . methylPREDNISolone (SOLU-MEDROL) injection  60 mg Intravenous Q12H  . metoprolol succinate  12.5 mg Oral BID  . mexiletine  200 mg Oral Q12H  . venlafaxine  37.5 mg Oral BID WC   PRN Medications: acetaminophen **OR** acetaminophen, haloperidol lactate, ipratropium-albuterol, morphine injection, [DISCONTINUED] ondansetron **OR** ondansetron (ZOFRAN) IV, oxyCODONE, phenol, polyethylene glycol Hemodynamic parameters:   Intake/Output: 08/24 0701 - 08/25 0700 In: 860.6 [IV Piggyback:860.6] Out: 850 [Urine:850]  Ventilator  Settings: FiO2 (%):  [75 %] 75 %   Other Labs:     LAB RESULTS:  Basic Metabolic Panel: Recent Labs  Lab 10/28/18 0341 10/29/18 0451 10/30/18 0436 10/31/18 0355 11/01/18 0444  NA 143 145 146* 146* 143   K 4.5 4.4 3.5 3.5 3.6  CL 107 106 106 105 103  CO2 28 30 32 30 29  GLUCOSE 120* 124* 120* 100* 108*  BUN 48* 50* 44* 27* 21  CREATININE 1.09 1.16 0.99 0.78 0.68  CALCIUM 9.2 9.2 8.9 8.8* 8.6*  MG 2.3 2.4 2.3 1.9 1.9   Liver Function Tests: No results for input(s): AST, ALT, ALKPHOS, BILITOT, PROT, ALBUMIN in the last 168 hours. No results for input(s): LIPASE, AMYLASE in the last 168 hours. No results for input(s): AMMONIA in the last 168 hours. CBC: Recent Labs  Lab 10/27/18 0434 10/28/18 0341 10/29/18 0451 11/01/18 0444  WBC 8.8 12.8* 10.8* 14.4*  NEUTROABS  --  11.4* 9.6* 12.1*  HGB 11.3* 12.3* 11.8* 12.2*  HCT 34.8* 39.0 37.6* 38.0*  MCV 93.0 95.1 95.2 94.8  PLT 213 268 302 249   Cardiac Enzymes: No results for input(s): CKTOTAL, CKMB, CKMBINDEX, TROPONINI in the last 168 hours. BNP: Invalid input(s): POCBNP CBG: Recent Labs  Lab 10/26/18 1011 10/28/18 2324  GLUCAP 148* 127*     IMAGING RESULTS:  Imaging: Dg Chest Port 1 View  Result Date: 11/01/2018 CLINICAL DATA:  Acute respiratory failure. EXAM: PORTABLE CHEST 1 VIEW COMPARISON:  06/29/2018. FINDINGS: AICD in stable position. Stable cardiomegaly. Diffuse severe bilateral pulmonary infiltrates again noted. No significant interim change. No prominent pleural effusion. Bilateral pleural thickening most likely scarring noted. No pneumothorax. No acute bony abnormality. IMPRESSION: 1.  AICD in stable position.  Stable cardiomegaly. 2. Diffuse severe bilateral pulmonary infiltrates again noted. No significant interim change. Electronically Signed   By: Maisie Fus  Register   On: 11/01/2018 06:33   @PROBHOSP @   ASSESSMENT AND PLAN    -Multidisciplinary rounds held today  Acute Hypoxic Respiratory Failure -procalcitonin mildly elevated  -patient on chronic amiodarone 400 bid  -s/p 5L diuresis improved  - possible CHF exacerbation as patient had improvement in BNP and clinically post diuresis -patient and wife  share multiple aspiration events sometime  "3 times per month".    Acute on chronic systolic CHF exacerbation -oxygen as needed -Lasix as tolerated -follow up cardiac enzymes as indicated ICU monitoring   ID -continue IV abx as prescibed -follow up cultures  GI/Nutrition GI PROPHYLAXIS as indicated DIET-->TF's as tolerated Constipation protocol as indicated  ENDO - ICU hypoglycemic\Hyperglycemia protocol -check FSBS per protocol   ELECTROLYTES -follow labs as needed -replace as needed -pharmacy consultation   DVT/GI PRX ordered -SCDs  TRANSFUSIONS AS NEEDED MONITOR FSBS ASSESS the  need for LABS as needed   Critical care provider statement:    Critical care time (minutes):  33   Critical care time was exclusive of:  Separately billable procedures and treating other patients   Critical care was necessary to treat or prevent imminent or life-threatening deterioration of the following conditions:  Acute hypoxemic respiratory failure, acute CHF exacerbation , pulmonary pneumonitis, multiple comorbid conditions   Critical care was time spent personally by me on the following activities:  Development of treatment plan with patient or surrogate, discussions with consultants, evaluation of patient's response to treatment, examination of patient, obtaining history from patient or surrogate, ordering and performing treatments and interventions, ordering and review of laboratory studies and re-evaluation of patient's condition.  I assumed direction of critical care for this patient from another provider in my specialty: no    This document was prepared using Dragon voice recognition software and may include unintentional dictation errors.    Vida RiggerFuad Everton Bertha, M.D.  Division of Pulmonary & Critical Care Medicine  [facility]

## 2018-11-01 NOTE — Progress Notes (Signed)
[place] [facility] [place] NAME: Darius Miller    MR#:  [id number]  DATE OF BIRTH:  11-07-1945  SUBJECTIVE:   Transferred to ICU 8/19 due to increasing shortness of breath.   He is on high flow nasal cannula today.  Feels weak and has shortness of breath.  No chest pain or fever.  REVIEW OF SYSTEMS:  He denies any fever  He denies blurring of eye or double vision. Denies ringing in the ears. He denies any cough but has shortness of breath and feels comfortable with high flow nasal cannula. He denies chest pain, palpitation or edema. He denies abdominal pain, nausea, vomiting, diarrhea He denies any focal neurological weakness. He denies depression or suicidal ideation.  Tolerating Diet: yes  DRUG ALLERGIES:   Allergies  Allergen Reactions  . Flomax [Tamsulosin Hcl]     VITALS:  Blood pressure 98/78, pulse 65, temperature 98.7 F (37.1 C), temperature source Oral, resp. rate 19, height 5\' 11"  (1.803 m), weight 74 kg, SpO2 91 %.  PHYSICAL EXAMINATION:   Constitutional: Appears well-developed  HENT: Normocephalic. Marland Kitchen Oropharynx is clear and moist.  Eyes: Conjunctivae and EOM are normal. PERRLA, no scleral icterus.  Neck: Normal ROM. Neck supple. No JVD. No tracheal deviation. CVS: RRR, S1/S2 +, no murmurs, no gallops, no carotid bruit.  Pulmonary: Decreased breath sounds throughout lung fields. Some crepitations.  High flow nasal cannula oxygen.  Left base wheezing Abdominal: Soft. BS +,  no distension, tenderness, rebound or guarding.  Musculoskeletal: Normal range of motion. No edema and no tenderness.  Neuro: Alert. CN 2-12 grossly intact. No focal deficits. Skin: Skin is warm and dry. No rash noted. Psychiatric: Alert and oriented x3.  LABORATORY PANEL:   CBC Recent Labs  Lab 11/01/18 0444  WBC 14.4*  HGB 12.2*  HCT 38.0*  PLT 249    ------------------------------------------------------------------------------------------------------------------  Chemistries  Recent Labs  Lab 11/01/18 0444  NA 143  K 3.6  CL 103  CO2 29  GLUCOSE 108*  BUN 21  CREATININE 0.68  CALCIUM 8.6*  MG 1.9   ------------------------------------------------------------------------------------------------------------------  Cardiac Enzymes No results for input(s): TROPONINI in the last 168 hours. ------------------------------------------------------------------------------------------------------------------  RADIOLOGY:     ASSESSMENT AND PLAN:   73 year old male with PAF and CAD who presented after mechanical fall and suffered rib fracture.  1. . Acute respiratory failure, hypoxic: This is due to combination of atelectasis from rib fractures and pneumonia.  Unasyn for aspiration needed BiPAP CT chest showed no evidence of PE however does show pneumonia Less likely pulmonary edema Repeat covid 19 testing negative As per pulmonologist, esophagus is dilated in past studies, may be having aspirations repeatedly. Repeat chest x-ray with no significant change  2..  Rib fractures: Patient is being treated conservatively.  Rib fractures are status post mechanical fall.  CT head and CT C-spine were negative for acute fracture. Continue incentive spirometer.  3.  PAF with history of sustained V. tach and ICD: Patient will continue on , metoprolol, Pradaxa and duloxetine Amiodarone is held due to concern of amiodarone toxicity  4.  CAD status post stents: Continue aspirin, metoprolol, and Lipitor.  5.  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  COPD without signs of exacerbation: Continue inhalers  7.  Essential hypertension: Patient will continue on metoprolol     Management plans discussed with the patient and he is in agreement.  CODE STATUS: Full  TOTAL  TIME TAKING CARE OF THIS PATIENT: 32 minutes.   Continue ICU care as patient is still on high flow nasal cannula.  Milagros Loll M.D on 11/01/2018 at 10:27 AM  Between 7am to 6pm - Pager - [phone]  After 6pm go to [email or web address] - password [email or web address]  [facility]  Office  [phone]  CC: Primary care physician; Gracelyn Nurse, MD  Note: This dictation was prepared with Dragon dictation along with smaller phrase technology. Any transcriptional errors that result from this process are unintentional.

## 2018-11-01 NOTE — Progress Notes (Signed)
Pharmacy Electrolyte Monitoring Consult:  Pharmacy consulted to assist in monitoring and replacing electrolytes in this 73 y.o. male admitted on 10/22/2018 with hypoxemic respiratory failure and respiratory distress secondary to pneumonitis secondary to chronic silent aspiration. 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 History significant for atrial fibrillation, coronary artery disease s/p RCA stents, mitral valve repair, ventricular tachycardia, stroke, and kidney stones. Patient was on antibiotics when pneumonia was suspected but has finished antibiotic therapy today.    Labs:  Sodium (mmol/L)  Date Value  11/01/2018 143   Potassium (mmol/L)  Date Value  11/01/2018 3.6   Magnesium (mg/dL)  Date Value  11/01/2018 1.9   Calcium (mg/dL)  Date Value  11/01/2018 8.6 (L)   Albumin (g/dL)  Date Value  10/22/2018 3.7   Corrected calcium: 8.84  Assessment/Plan: Potassium chloride 40 mEq x 1 dose. Magnesium 400mg  PO BID. Furosemide 40mg  PO Daily continued.   Due to extensive cardiac issues, will replace for goal potassium ~ 4 and goal magnesium ~ 2.   Will obtain BMP/Magnesium with am labs.   Pharmacy will continue to monitor and adjust per consult.   Mathius Birkeland Dear [name] 11/01/2018 2:22 PM

## 2018-11-01 NOTE — Progress Notes (Deleted)
Precedex paused due to bradycardia into the 30s. Pt now awake, reoriented, spoke to family on the phone. Now calm and cooperative.

## 2018-11-02 ENCOUNTER — Encounter: Payer: Self-pay | Admitting: Primary Care

## 2018-11-02 ENCOUNTER — Inpatient Hospital Stay: Payer: Medicare HMO

## 2018-11-02 DIAGNOSIS — Z7189 Other specified counseling: Secondary | ICD-10-CM

## 2018-11-02 DIAGNOSIS — R42 Dizziness and giddiness: Secondary | ICD-10-CM

## 2018-11-02 DIAGNOSIS — J984 Other disorders of lung: Secondary | ICD-10-CM

## 2018-11-02 DIAGNOSIS — Z515 Encounter for palliative care: Secondary | ICD-10-CM

## 2018-11-02 LAB — CBC
HCT: 39.3 % (ref 39.0–52.0)
Hemoglobin: 12.4 g/dL — ABNORMAL LOW (ref 13.0–17.0)
MCH: 29.7 pg (ref 26.0–34.0)
MCHC: 31.6 g/dL (ref 30.0–36.0)
MCV: 94.2 fL (ref 80.0–100.0)
Platelets: 266 10*3/uL (ref 150–400)
RBC: 4.17 MIL/uL — ABNORMAL LOW (ref 4.22–5.81)
RDW: 14.8 % (ref 11.5–15.5)
WBC: 13.3 10*3/uL — ABNORMAL HIGH (ref 4.0–10.5)
nRBC: 0 % (ref 0.0–0.2)

## 2018-11-02 LAB — BASIC METABOLIC PANEL
Anion gap: 11 (ref 5–15)
BUN: 25 mg/dL — ABNORMAL HIGH (ref 8–23)
CO2: 32 mmol/L (ref 22–32)
Calcium: 8.8 mg/dL — ABNORMAL LOW (ref 8.9–10.3)
Chloride: 99 mmol/L (ref 98–111)
Creatinine, Ser: 0.76 mg/dL (ref 0.61–1.24)
GFR calc Af Amer: >60 mL/min (ref >60)
GFR calc non Af Amer: >60 mL/min (ref >60)
Glucose, Bld: 132 mg/dL — ABNORMAL HIGH (ref 70–99)
Potassium: 4.3 mmol/L (ref 3.5–5.1)
Sodium: 142 mmol/L (ref 135–145)

## 2018-11-02 MED ORDER — METHYLPREDNISOLONE SODIUM SUCC 125 MG IJ SOLR
60.0000 mg | Freq: Two times a day (BID) | INTRAMUSCULAR | Status: DC
  Administered 2018-11-02 – 2018-11-04 (×4): 60 mg via INTRAVENOUS
  Filled 2018-11-02 (×4): qty 2

## 2018-11-02 NOTE — Evaluation (Signed)
Physical Therapy Evaluation Patient Details Name: Armany Mano MRN: [id number] DOB: Apr 20, 1945 Today's Date: 11/02/2018   History of Present Illness  Pt admitted 08/15 s/p fall resulting in rib fracture (2nd and 3rd)  transferred to ICU 08/19 due to decline in respiratory status,  He was placed on BiPAP.  Chest x-ray reveals patchy bilateral infiltrates. PMH of afib, HTN, ICD, kidney stones, stroke    Clinical Impression  Patient re-evaluated at this time by PT due to recent transfer to CCU, goals and POC updated. Pt up in bed, oriented to self, place and situation, disoriented to time (wrong day of the week, wrong month), on HFNC ~11L at 75%. Patient was able to participate and perform bed level therapeutic exercises with verbal and visual cues. Became SOB and reported fatigue with exercises, instructed in PLB. SpO2 readings >90% throughout. Further mobility held due to patient fatigue.  Overall the patient demonstrated deficits (see "PT Problem List") that impede the patient's functional abilities, safety, and mobility and would benefit from skilled PT intervention. Recommendation is STR due to acute decline in functional status.     Follow Up Recommendations SNF    Equipment Recommendations  Rolling walker with 5" wheels    Recommendations for Other Services       Precautions / Restrictions Precautions Precautions: Fall Restrictions Weight Bearing Restrictions: No      Mobility  Bed Mobility               General bed mobility comments: Pt stated he was fatigued and stated he was SOB, observable increased work of breathing with exercises, further mobility deferred.  Transfers                    Ambulation/Gait                Stairs            Wheelchair Mobility    Modified Rankin (Stroke Patients Only)       Balance                                             Pertinent Vitals/Pain Pain Assessment: No/denies pain     Home Living Family/patient expects to be discharged to:: Private residence Living Arrangements: Spouse/significant other Available Help at Discharge: Family;Available 24 hours/day Type of Home: House Home Access: Level entry     Home Layout: One level Home Equipment: Cane - single point;Walker - 2 wheels      Prior Function Level of Independence: Independent         Comments: some PLOF gathered from this admission, independent with ADL/IADLs. Does endorse at least 4 falls in the last 3 months, prior to admission 3 falls in one week     Hand Dominance   Dominant Hand: Right    Extremity/Trunk Assessment   Upper Extremity Assessment Upper Extremity Assessment: Generalized weakness    Lower Extremity Assessment Lower Extremity Assessment: Generalized weakness;RLE deficits/detail;LLE deficits/detail RLE Deficits / Details: able to perform SLR and heel slide without physical assist LLE Deficits / Details: able to perform SLR and heel slide without physical assist       Communication   Communication: No difficulties  Cognition Arousal/Alertness: Awake/alert Behavior During Therapy: WFL for tasks assessed/performed Overall Cognitive Status: No family/caregiver present to determine baseline cognitive functioning  General Comments: Pt oriented to self, place, disoriented to time      General Comments      Exercises General Exercises - Lower Extremity Ankle Circles/Pumps: Both;Supine;AROM;15 reps Heel Slides: AROM;Both;15 reps;Strengthening Hip ABduction/ADduction: AROM;Strengthening;Both;15 reps Straight Leg Raises: AROM;Strengthening;Both;15 reps Other Exercises Other Exercises: Patient educated about PLB to assist with pt increased work of breathing   Assessment/Plan    PT Assessment Patient needs continued PT services  PT Problem List Decreased activity tolerance;Decreased balance;Decreased  mobility;Cardiopulmonary status limiting activity;Pain       PT Treatment Interventions DME instruction;Gait training;Therapeutic exercise;Balance training    PT Goals (Current goals can be found in the Care Plan section)  Acute Rehab PT Goals Patient Stated Goal: to go home PT Goal Formulation: With patient Time For Goal Achievement: 11/06/18 Potential to Achieve Goals: Good    Frequency Min 2X/week   Barriers to discharge        Co-evaluation               AM-PAC PT "6 Clicks" Mobility  Outcome Measure Help needed turning from your back to your side while in a flat bed without using bedrails?: A Lot Help needed moving from lying on your back to sitting on the side of a flat bed without using bedrails?: A Lot Help needed moving to and from a bed to a chair (including a wheelchair)?: Total Help needed standing up from a chair using your arms (e.g., wheelchair or bedside chair)?: Total Help needed to walk in hospital room?: Total Help needed climbing 3-5 steps with a railing? : Total 6 Click Score: 8    End of Session Equipment Utilized During Treatment: Oxygen Activity Tolerance: Patient limited by fatigue Patient left: in bed;with bed alarm set;with call bell/phone within reach Nurse Communication: Mobility status PT Visit Diagnosis: Muscle weakness (generalized) (M62.81);Repeated falls (R29.6);History of falling (Z91.81);Difficulty in walking, not elsewhere classified (R26.2);Pain Pain - Right/Left: Right Pain - part of body: (ribs)    Time: 6759-1638 PT Time Calculation (min) (ACUTE ONLY): 19 min   Charges:   PT Evaluation $PT Re-evaluation: 1 Re-eval PT Treatments $Therapeutic Exercise: 8-22 mins       Olga Coaster PT, DPT 12:51 PM,11/02/18 [phone]

## 2018-11-02 NOTE — Consult Note (Signed)
Consultation Note Date: 11/02/2018   Patient Name: Darius Miller  DOB: 04-21-1945  MRN: [id number]  Age / Sex: 73 y.o., male  PCP: Darius Hire, MD Referring Physician: Hillary Bow, MD  Reason for Consultation: Establishing goals of care  HPI/Patient Profile: 73 y.o. male  with past medical history of A fib, CAD sp RCA stents, mitral valve repair, HTN, biventricular ICD, kidney stones admitted on 10/22/2018 with Acuterespiratory failure, hypoxic, 2/2 combination of atelectasis from rib fractures and pneumonia with Unasyn for aspiration. 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   Clinical Assessment and Goals of Care: I have reviewed medical records including EPIC notes, labs and imaging, assessed the patient and then met at the bedside along with wife Darius Miller to discuss diagnosis prognosis, [place], EOL wishes, disposition and options.  I introduced Palliative Medicine as specialized medical care for people living with serious illness. It focuses on providing relief from the symptoms and stress of a serious illness. The goal is to improve quality of life for both the patient and the family.    We discussed a brief life review of the patient. 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  As far as functional and nutritional status, [name] tells me that he fell because of his dogs, but wife states she feels he fell because he got dizzy.  We talk about turn cough and deep breath.   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   [name] states he would like ot see his [place]tu dog "Darius Miller".  We talk exposure in the hospital and keeping the dog safe.  He also states he has had difficulty with taking magnesium tablets.   We talk about cardiology consult to evaluate cardiac concerns.  Questions and concerns were  addressed.  The family was encouraged to call with questions or concerns, and we plan on follow up tomorrow.   Conference with CCM attending and pharmacy.    HCPOA    NEXT OF KIN -  Wife, Darius Miller    SUMMARY OF RECOMMENDATIONS   Continue to treat the treatable, No CPR, no intubation. Agreeable to rehab if able  Code Status/Advance Care Planning:  DNR  Symptom Management:   Per hospitalist, no additional needs at this time.   Palliative Prophylaxis:   Aspiration and Turn Reposition  Additional Recommendations (Limitations, Scope, Preferences):  treat the treatable, no CPR, no intubation   Psycho-social/Spiritual:   Desire for further Chaplaincy support:no  Additional Recommendations: Caregiving  Support/Resources and Education on Hospice  Prognosis:   Unable to determine, based on outcomes.  6 months or less would not be surprising based on decreasing functional status.   Discharge Planning: agreeable to rehab if able.       Primary Diagnoses: Present on Admission: . Fever . Rib fractures   I have reviewed the medical record, interviewed the patient and family, and examined the patient. The following aspects are pertinent.  Past Medical History:  Diagnosis Date  .  Atrial fibrillation ([facility])   . Coronary artery disease  s/p RCA stents   . History of mitral valve repair   . Hypertension   . 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 Kidney stones   . Stroke ([place])   . Ventricular tachycardia ([facility])    Social History   Socioeconomic History  . Marital status: Single    Spouse name: Not on file  . Number of children: Not on file  . Years of education: Not on file  . Highest education level: Not on file  Occupational History  . Not on file  Social Needs  . Financial resource strain: Not on file  . Food insecurity    Worry: Not on file    Inability: Not on file  . Transportation needs    Medical: Not  on file    Non-medical: Not on file  Tobacco Use  . Smoking status: Former Smoker    Packs/day: 2.00    Years: 20.00    Pack years: 40.00    Types: Cigarettes  . Smokeless tobacco: Never Used  Substance and Sexual Activity  . Alcohol use: Not on file  . Drug use: Not on file  . Sexual activity: Not on file  Lifestyle  . Physical activity    Days per week: Not on file    Minutes per session: Not on file  . Stress: Not on file  Relationships  . Social Herbalist on phone: Not on file    Gets together: Not on file    Attends religious service: Not on file    Active member of club or organization: Not on file    Attends meetings of clubs or organizations: Not on file    Relationship status: Not on file  Other Topics Concern  . Not on file  Social History Narrative  . Not on file   History reviewed. No pertinent family history. Scheduled Meds: . aspirin EC  81 mg Oral Daily  . atorvastatin  20 mg Oral Daily  . bacitracin   Topical Daily  . chlorhexidine  15 mL Mouth Rinse BID  . Chlorhexidine Gluconate Cloth  6 each Topical Daily  . dabigatran  150 mg Oral BID  . furosemide  40 mg Oral Daily  . magnesium oxide  400 mg Oral BID  . mouth rinse  15 mL Mouth Rinse q12n4p  . methylPREDNISolone (SOLU-MEDROL) injection  60 mg Intravenous Q12H  . metoprolol succinate  12.5 mg Oral BID  . mexiletine  200 mg Oral Q12H  . venlafaxine  37.5 mg Oral BID WC   Continuous Infusions: PRN Meds:.acetaminophen **OR** acetaminophen, haloperidol lactate, ipratropium-albuterol, morphine injection, [DISCONTINUED] ondansetron **OR** ondansetron (ZOFRAN) IV, oxyCODONE, phenol, polyethylene glycol Medications Prior to Admission:  Prior to Admission medications   Medication Sig Start Date End Date Taking? Authorizing Provider  acetaminophen (TYLENOL) 500 MG tablet Take 1 tablet by mouth every 8 (eight) hours as needed.   Yes [provider]  amiodarone (PACERONE) 400 MG tablet  Take 1 tablet (400 mg total) by mouth 2 (two) times daily. 01/22/18  Yes Bettey Costa, MD  aspirin 81 MG tablet Take 1 tablet (81 mg total) by mouth daily. 06/04/17  Yes Tukov-Yual, Magdalene S, NP  atorvastatin (LIPITOR) 20 MG tablet Take 20 mg by mouth daily. 12/03/17  Yes [provider]  dabigatran (PRADAXA) 150 MG CAPS capsule Take 1 capsule (150 mg total) by mouth 2 (two) times  daily. 06/04/17  Yes Tukov-Yual, Magdalene S, NP  furosemide (LASIX) 40 MG tablet Take 1 tablet (40 mg total) by mouth daily. 06/04/17  Yes Tukov-Yual, Magdalene S, NP  magnesium oxide (MAG-OX) 400 MG tablet Take 1 tablet by mouth 2 (two) times daily. 11/08/17  Yes [provider]  metoprolol succinate (TOPROL-XL) 25 MG 24 hr tablet Take 0.5 tablets (12.5 mg total) by mouth 2 (two) times daily. 01/22/18  Yes Mody, Ulice Bold, MD  mexiletine (MEXITIL) 200 MG capsule Take 1 capsule (200 mg total) by mouth every 12 (twelve) hours. 01/22/18  Yes Mody, Ulice Bold, MD  nitroGLYCERIN (NITROSTAT) 0.4 MG SL tablet Place 1 tablet (0.4 mg total) under the tongue every 5 (five) minutes as needed for chest pain. 06/04/17  Yes Tukov-Yual, Magdalene S, NP  TRELEGY ELLIPTA 100-62.5-25 MCG/INH AEPB INHALE 1 PUFF INTO THE LUNGS ONCE DAILY 09/01/18  Yes [provider]  venlafaxine XR (EFFEXOR-XR) 75 MG 24 hr capsule Take 75 mg by mouth daily.  09/23/18  Yes [provider]  oxyCODONE (OXY IR/ROXICODONE) 5 MG immediate release tablet Take 1 tablet (5 mg total) by mouth every 4 (four) hours as needed for moderate pain. 10/23/18   Bettey Costa, MD   Allergies  Allergen Reactions  . Flomax [Tamsulosin Hcl]    Review of Systems  Unable to perform ROS: Severe respiratory distress    Physical Exam Vitals signs and nursing note reviewed.  Constitutional:      Appearance: He is normal weight. He is ill-appearing.     Comments: Will briefly make, but not keep eye contact.   HENT:     Head: Atraumatic.  Cardiovascular:      Rate and Rhythm: Normal rate.  Pulmonary:     Comments: High flow nasal cannula, able to make full sentences  Abdominal:     General: Abdomen is flat. There is no distension.  Musculoskeletal:        General: No swelling.  Skin:    General: Skin is warm and dry.  Neurological:     Mental Status: He is alert and oriented to person, place, and time.  Psychiatric:        Mood and Affect: Mood normal.        Behavior: Behavior normal.     Vital Signs: BP (!) 143/79   Pulse (!) 34   Temp 97.9 F (36.6 C) (Oral)   Resp 20   Ht 5' 11" (1.803 m)   Wt 74 kg   SpO2 (!) 71%   BMI 22.75 kg/m  Pain Scale: 0-10 POSS *See Group Information*: S-Acceptable,Sleep, easy to arouse Pain Score: 0-No pain   SpO2: SpO2: (!) 71 % O2 Device:SpO2: (!) 71 % O2 Flow Rate: .O2 Flow Rate (L/min): 45 L/min  IO: Intake/output summary:   Intake/Output Summary (Last 24 hours) at 11/02/2018 1429 Last data filed at 11/02/2018 1000 Gross per 24 hour  Intake -  Output 1050 ml  Net -1050 ml    LBM: Last BM Date: 10/30/18 Baseline Weight: Weight: 76.7 kg Most recent weight: Weight: 74 kg     Palliative Assessment/Data:   Flowsheet Rows     Most Recent Value  Intake Tab  Referral Department  Hospitalist  Unit at Time of Referral  Intermediate Care Unit  Palliative Care Primary Diagnosis  Pulmonary  Date Notified  11/02/18  Palliative Care Type  New Palliative care  Reason for referral  Clarify Goals of Care  Date of Admission  10/22/18  Date  first seen by Palliative Care  11/02/18  # of days Palliative referral response time  0 Day(s)  # of days IP prior to Palliative referral  11  Clinical Assessment  Palliative Performance Scale Score  30%  Pain Max last 24 hours  Not able to report  Pain Min Last 24 hours  Not able to report  Dyspnea Max Last 24 Hours  Not able to report  Dyspnea Min Last 24 hours  Not able to report  Psychosocial & Spiritual Assessment  Palliative Care Outcomes       Time In: 1430 Time Out: 1540 Time Total: 70 minutes  Greater than 50%  of this time was spent counseling and coordinating care related to the above assessment and plan.  Signed by: Drue Novel, NP   Please contact Palliative Medicine Team phone at [phone] for questions and concerns.  For individual provider: See Shea Evans

## 2018-11-02 NOTE — Progress Notes (Signed)
Pharmacy Electrolyte Monitoring Consult:  Pharmacy consulted to assist in monitoring and replacing electrolytes in this 73 y.o. male admitted on 10/22/2018 with hypoxemic respiratory failure and respiratory distress secondary to pneumonitis secondary to chronic silent aspiration. 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 History significant for atrial fibrillation, coronary artery disease s/p RCA stents, mitral valve repair, ventricular tachycardia, stroke, and kidney stones. Patient was on antibiotics when pneumonia was suspected but has finished antibiotic therapy. 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   Labs:  Sodium (mmol/L)  Date Value  11/02/2018 142   Potassium (mmol/L)  Date Value  11/02/2018 4.3   Magnesium (mg/dL)  Date Value  11/01/2018 1.9   Calcium (mg/dL)  Date Value  11/02/2018 8.8 (L)   Albumin (g/dL)  Date Value  10/22/2018 3.7     Assessment/Plan: Furosemide 40mg  PO Daily continued.   Electrolytes WNL and do not warrant replacement at this time.  Due to extensive cardiac issues, will replace for goal potassium ~ 4 and goal magnesium ~ 2.   Will obtain BMP/Magnesium with am labs.   Pharmacy will continue to monitor and adjust per consult.   Darius Miller Darius Miller 11/02/2018 12:06 PM

## 2018-11-03 DIAGNOSIS — R0602 Shortness of breath: Secondary | ICD-10-CM

## 2018-11-03 LAB — CBC
HCT: 41.7 % (ref 39.0–52.0)
Hemoglobin: 13.2 g/dL (ref 13.0–17.0)
MCH: 29.6 pg (ref 26.0–34.0)
MCHC: 31.7 g/dL (ref 30.0–36.0)
MCV: 93.5 fL (ref 80.0–100.0)
Platelets: 313 10*3/uL (ref 150–400)
RBC: 4.46 MIL/uL (ref 4.22–5.81)
RDW: 14.6 % (ref 11.5–15.5)
WBC: 18.7 10*3/uL — ABNORMAL HIGH (ref 4.0–10.5)
nRBC: 0 % (ref 0.0–0.2)

## 2018-11-03 LAB — BASIC METABOLIC PANEL
Anion gap: 10 (ref 5–15)
BUN: 38 mg/dL — ABNORMAL HIGH (ref 8–23)
CO2: 32 mmol/L (ref 22–32)
Calcium: 8.6 mg/dL — ABNORMAL LOW (ref 8.9–10.3)
Chloride: 101 mmol/L (ref 98–111)
Creatinine, Ser: 0.82 mg/dL (ref 0.61–1.24)
GFR calc Af Amer: >60 mL/min (ref >60)
GFR calc non Af Amer: >60 mL/min (ref >60)
Glucose, Bld: 152 mg/dL — ABNORMAL HIGH (ref 70–99)
Potassium: 4 mmol/L (ref 3.5–5.1)
Sodium: 143 mmol/L (ref 135–145)

## 2018-11-03 MED ORDER — POLYETHYLENE GLYCOL 3350 17 G PO PACK
17.0000 g | PACK | Freq: Every day | ORAL | Status: DC
  Administered 2018-11-04 – 2018-11-09 (×4): 17 g via ORAL
  Filled 2018-11-03 (×6): qty 1

## 2018-11-03 MED ORDER — NYSTATIN 100000 UNIT/ML MT SUSP
5.0000 mL | Freq: Four times a day (QID) | OROMUCOSAL | Status: DC
  Filled 2018-11-03 (×2): qty 5

## 2018-11-03 MED ORDER — POTASSIUM CHLORIDE CRYS ER 20 MEQ PO TBCR
40.0000 meq | EXTENDED_RELEASE_TABLET | Freq: Once | ORAL | Status: DC
  Filled 2018-11-03: qty 2

## 2018-11-03 MED ORDER — MAGIC MOUTHWASH
5.0000 mL | Freq: Four times a day (QID) | ORAL | Status: DC
  Administered 2018-11-03 – 2018-11-09 (×18): 5 mL via ORAL
  Filled 2018-11-03 (×27): qty 10

## 2018-11-03 MED ORDER — POTASSIUM CHLORIDE 10 MEQ/100ML IV SOLN
10.0000 meq | INTRAVENOUS | Status: AC
  Administered 2018-11-03 – 2018-11-04 (×4): 10 meq via INTRAVENOUS
  Filled 2018-11-03 (×4): qty 100

## 2018-11-03 MED ORDER — POLYETHYLENE GLYCOL 3350 17 G PO PACK: 17.0000 g | PACK | Freq: Every day | ORAL | Status: DC

## 2018-11-03 MED ORDER — DOCUSATE SODIUM 100 MG PO CAPS
100.0000 mg | ORAL_CAPSULE | Freq: Two times a day (BID) | ORAL | Status: DC
  Administered 2018-11-03 – 2018-11-09 (×12): 100 mg via ORAL
  Filled 2018-11-03 (×12): qty 1

## 2018-11-03 MED ORDER — FUROSEMIDE 10 MG/ML IJ SOLN
40.0000 mg | Freq: Two times a day (BID) | INTRAMUSCULAR | Status: AC
  Administered 2018-11-03 – 2018-11-05 (×6): 40 mg via INTRAVENOUS
  Filled 2018-11-03 (×7): qty 4

## 2018-11-03 NOTE — Progress Notes (Signed)
[employer] - [place] at [facility]   PATIENT NAME: Darius Miller    MR#:  [id number]  DATE OF BIRTH:  12-Jun-1945  SUBJECTIVE:   Transferred to ICU 8/19 due to increasing shortness of breath after being admitted on 8/15 with rib fractures from mechanical fall.  He is on high flow nasal cannula today.  Feels weak and has shortness of breath.  No chest pain or fever.  REVIEW OF SYSTEMS:  He denies any fever  He denies blurring of eye or double vision. Denies ringing in the ears. He denies any cough but has shortness of breath and feels comfortable with high flow nasal cannula. He denies chest pain, palpitation or edema. He denies abdominal pain, nausea, vomiting, diarrhea He denies any focal neurological weakness. He denies depression or suicidal ideation.  Tolerating Diet: yes  DRUG ALLERGIES:   Allergies  Allergen Reactions  . Flomax [Tamsulosin Hcl]     VITALS:  Blood pressure 133/74, pulse 61, temperature 98.8 F (37.1 C), temperature source Axillary, resp. rate 11, height 5\' 11"  (1.803 m), weight 74 kg, SpO2 92 %.  PHYSICAL EXAMINATION:   Constitutional: Appears well-developed  HENT: Normocephalic. Marland Kitchen Oropharynx is clear and moist.  Eyes: Conjunctivae and EOM are normal. PERRLA, no scleral icterus.  Neck: Normal ROM. Neck supple. No JVD. No tracheal deviation. CVS: RRR, S1/S2 +, no murmurs, no gallops, no carotid bruit.  Pulmonary: Decreased breath sounds throughout lung fields. Some crepitations.  High flow nasal cannula oxygen.  Left base wheezing Abdominal: Soft. BS +,  no distension, tenderness, rebound or guarding.  Musculoskeletal: Normal range of motion. No edema and no tenderness.  Neuro: Alert. CN 2-12 grossly intact. No focal deficits. Skin: Skin is warm and dry. No rash noted. Psychiatric: Alert and oriented x3.  LABORATORY PANEL:   CBC Recent Labs  Lab 11/03/18 0543  WBC 18.7*  HGB 13.2  HCT 41.7  PLT 313    ------------------------------------------------------------------------------------------------------------------  Chemistries  Recent Labs  Lab 11/01/18 0444  11/03/18 0543  NA 143   < > 143  K 3.6   < > 4.0  CL 103   < > 101  CO2 29   < > 32  GLUCOSE 108*   < > 152*  BUN 21   < > 38*  CREATININE 0.68   < > 0.82  CALCIUM 8.6*   < > 8.6*  MG 1.9  --   --    < > = values in this interval not displayed.   ------------------------------------------------------------------------------------------------------------------  Cardiac Enzymes No results for input(s): TROPONINI in the last 168 hours. ------------------------------------------------------------------------------------------------------------------  RADIOLOGY:     ASSESSMENT AND PLAN:   73 year old male with PAF and CAD who presented after mechanical fall and suffered rib fracture.  1. . Acute respiratory failure, hypoxic: This is due to combination of atelectasis from rib fractures and pneumonia.  Finished course of unasyn needed BiPAP and now on HFNC CT chest showed no evidence of PE, however did show pneumonia Less likely pulmonary edema. No improvement with diuresis Repeat covid 19 testing negative As per pulmonologist, esophagus is dilated in past studies, may be having aspirations repeatedly. Repeat chest x-ray with no significant change  2..  Rib fractures: Patient is being treated conservatively.  Rib fractures are status post mechanical fall.  CT head and CT C-spine were negative for acute fracture. Continue incentive spirometer.  3.  PAF with history of sustained V. 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  CAD status post stents: Continue aspirin, metoprolol, and Lipitor.  5.  Acute on chronic diastolic chf Diuresed and now euvolemic  6.  COPD without signs of exacerbation:  Continue inhalers  7.  Essential hypertension: Patient will continue on metoprolol     Management plans discussed with the patient and he is in agreement.  CODE STATUS: DNR/DNI  TOTAL TIME TAKING CARE OF THIS PATIENT: 32 minutes.   Continue ICU care as patient is still on high flow nasal cannula. Discussed with Dr. Alfonse Spruce M.D on 11/01/2018 at 11:09 AM  Between 7am to 6pm - Pager - [phone]  After 6pm go to [email or web address] - password [email or web address] [facility]  Office  [phone]  CC: Primary care physician; Baxter Hire, MD  Note: This dictation was prepared with Dragon dictation along with smaller phrase technology. Any transcriptional errors that result from this process are unintentional.

## 2018-11-03 NOTE — Progress Notes (Signed)
Patient declines PO potassium d/t oral blisters, IV K order per NP Blakeney, but patient's PIV site was burning and sore w/ K running even at 1/2 rate, with KVO NS running with it.  IV team consult placed for possible midline.

## 2018-11-03 NOTE — Plan of Care (Signed)
Pt up to chair this shift with moderate assist x 1, tolerating bubble wall HFNC at 15 liters, wife at bedside, both updated on POC, blisters noted to oral cavity, Medline products DCd and magic mouthwash started

## 2018-11-03 NOTE — Progress Notes (Addendum)
Pharmacy Electrolyte Monitoring Consult:  Pharmacy consulted to assist in monitoring and replacing electrolytes in this 73 y.o. male admitted on 10/22/2018 with hypoxemic respiratory failure and respiratory distress secondary to pneumonitis secondary to chronic silent aspiration. 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 History significant for atrial fibrillation, coronary artery disease s/p RCA stents, mitral valve repair, ventricular tachycardia, stroke, and kidney stones. Patient was on antibiotics when pneumonia was suspected but has finished antibiotic therapy. 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   Labs:  Sodium (mmol/L)  Date Value  11/03/2018 143   Potassium (mmol/L)  Date Value  11/03/2018 4.0   Magnesium (mg/dL)  Date Value  11/01/2018 1.9   Calcium (mg/dL)  Date Value  11/03/2018 8.6 (L)   Albumin (g/dL)  Date Value  10/22/2018 3.7     Assessment/Plan: Furosemide continued but has been switched from 40 mg PO QD to 40 mg IV BID. Magnesium oxide 400 mg PO BID also continued.   Will give potassium chloride 40 mEq PO x 1 dose.  Due to extensive cardiac issues, will replace for goal potassium ~ 4 and goal magnesium ~ 2.   Will obtain BMP/Magnesium with am labs.   Pharmacy will continue to monitor and adjust per consult.   Harry Bark Dear [name] 11/03/2018 11:09 AM

## 2018-11-03 NOTE — Progress Notes (Signed)
[employer] - [place] at [facility]   PATIENT NAME: Darius Miller    MR#:  [id number]  DATE OF BIRTH:  01-05-1946  SUBJECTIVE:   Transferred to ICU 8/19 due to increasing shortness of breath after being admitted on 8/15 with rib fractures from mechanical fall.  Seen and examined 11/02/2018 AM  Still on HFNC. Has SOB  REVIEW OF SYSTEMS:  He denies any fever  He denies blurring of eye or double vision. Denies ringing in the ears. He denies any cough but has shortness of breath and feels comfortable with high flow nasal cannula. He denies chest pain, palpitation or edema. He denies abdominal pain, nausea, vomiting, diarrhea He denies any focal neurological weakness. He denies depression or suicidal ideation.  Tolerating Diet: yes  DRUG ALLERGIES:   Allergies  Allergen Reactions  . Flomax [Tamsulosin Hcl]     VITALS:  Blood pressure 133/74, pulse 61, temperature 98.8 F (37.1 C), temperature source Axillary, resp. rate 11, height 5\' 11"  (1.803 m), weight 74 kg, SpO2 92 %.  PHYSICAL EXAMINATION:   Constitutional: Appears well-developed  HENT: Normocephalic. Marland Kitchen Oropharynx is clear and moist.  Eyes: Conjunctivae and EOM are normal. PERRLA, no scleral icterus.  Neck: Normal ROM. Neck supple. No JVD. No tracheal deviation. CVS: RRR, S1/S2 +, no murmurs, no gallops, no carotid bruit.  Pulmonary: Decreased breath sounds throughout lung fields. Some crepitations.  High flow nasal cannula oxygen.  Left base wheezing Abdominal: Soft. BS +,  no distension, tenderness, rebound or guarding.  Musculoskeletal: Normal range of motion. No edema and no tenderness.  Neuro: Alert. CN 2-12 grossly intact. No focal deficits. Skin: Skin is warm and dry. No rash noted. Psychiatric: Alert and oriented x3.  LABORATORY PANEL:   CBC Recent Labs  Lab 11/03/18 0543  WBC 18.7*  HGB 13.2  HCT 41.7  PLT 313    ------------------------------------------------------------------------------------------------------------------  Chemistries  Recent Labs  Lab 11/01/18 0444  11/03/18 0543  NA 143   < > 143  K 3.6   < > 4.0  CL 103   < > 101  CO2 29   < > 32  GLUCOSE 108*   < > 152*  BUN 21   < > 38*  CREATININE 0.68   < > 0.82  CALCIUM 8.6*   < > 8.6*  MG 1.9  --   --    < > = values in this interval not displayed.   ------------------------------------------------------------------------------------------------------------------  Cardiac Enzymes No results for input(s): TROPONINI in the last 168 hours. ------------------------------------------------------------------------------------------------------------------  RADIOLOGY:     ASSESSMENT AND PLAN:   73 year old male with PAF and CAD who presented after mechanical fall and suffered rib fracture.  1. . Acute respiratory failure, hypoxic: This is due to combination of atelectasis from rib fractures and pneumonia.  Finished course of unasyn needed BiPAP and now on HFNC CT chest showed no evidence of PE, however did show pneumonia Less likely pulmonary edema. No improvement with diuresis Repeat covid 19 testing NEGATIVE  Likely aspiration PNA Repeat chest x-ray with no significant change  2..  Rib fractures: Patient is being treated conservatively.  Rib fractures are status post mechanical fall.  CT head and CT C-spine were negative for acute fracture. Continue incentive spirometer.  3.  PAF with history of sustained V. 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  CAD status post stents: Continue aspirin,  metoprolol, and Lipitor.  5.  Acute on chronic diastolic chf Diuresed and now euvolemic  6.  COPD without signs of exacerbation: Continue inhalers  7.  Essential hypertension: Patient will continue on  metoprolol     Management plans discussed with the patient and he is in agreement.  CODE STATUS: DNR/DNI  TOTAL TIME TAKING CARE OF THIS PATIENT: 32 minutes.   Continue ICU care as patient is still on high flow nasal cannula. Discussed with Dr. Alfonse Spruce M.D on 11/02/2018 at 11:13 AM  Between 7am to 6pm - Pager - [phone]  After 6pm go to [email or web address] - password [email or web address] [facility]  Office  [phone]  CC: Primary care physician; Baxter Hire, MD  Note: This dictation was prepared with Dragon dictation along with smaller phrase technology. Any transcriptional errors that result from this process are unintentional.

## 2018-11-03 NOTE — Progress Notes (Signed)
CRITICAL CARE PROGRESS NOTE    Name: Darius Miller MRN: [id number] DOB: July 05, 1945     LOS: 9   SUBJECTIVE FINDINGS & SIGNIFICANT EVENTS   Patient description:  Pt admitted 08/15 s/p fall resulting in a rib fracture transferred to ICU 08/19 with worsening hypoxemic respiratory failure and respiratory distress.  He was placed on BiPAP.  Chest x-ray reveals patchy bilateral infiltrates.  CT of chest from 8/18 reveals patchy, predominantly interstitial infiltrates in both lungs likely pneumonitis in the setting of chronic aspiration although there is concern this could be amiodarone induced   Lines / Drains: PIV  Cultures / Sepsis markers: Blood cx neg up to date , procalcitonin mildly elevated .6  Antibiotics: 10/22/2018: ED s/p fall. 10/23/2018: Continued to be febrile. Required O2 via [place]. CSW with recommendation to transfer to SNF - [facility] 10/25/2018: Plan was to discharge to [facility] today. He was hypoxic (60's on 2L oxygen). Started on high flow. 1 dose Lasix given. [facility] came to pick him up. Patient at that time was on 10 L high flow with SpO2 @ 86%. Kept in hospital.  BNP baseline 346.0. CXR completed --> stable cardiomegaly, bronchovascular crowding and vascular congestion, and bilateral calcified pleural plaques.  10/26/18: On High Flow - to 55L @ 100% FiO2. Started on Cefepime.  9 AM patient on HFNC 55 lpm @ 100% - with O2 saturation in the high 80's, and was lethargic. Patient admitted to ICU for further management. 10/28/18: . Pt and pts wife states after pt eats he has frequent episodes of coughing and he also endorses problems with acid reflux.  Therefore, cefepime and doxycycline stopped and started unasyn for aspiration  10/30/18: Tolerating 40L @ 60%    Overnight:  Patient  improved now on 15L [place]  Continue  steroids for poss amio tox   PAST MEDICAL HISTORY   Past Medical History:  Diagnosis Date  . Atrial fibrillation (HCC)   . Coronary artery disease  s/p RCA stents   . History of mitral valve repair   . Hypertension   . 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 Kidney stones   . Stroke (HCC)   . Ventricular tachycardia (HCC)      SURGICAL HISTORY   Past Surgical History:  Procedure Laterality Date  . CARDIAC CATHETERIZATION    . CORONARY ANGIOPLASTY    . Coronary stent 2009  2009  . ICD IMPLANT    . MITRAL VALVE REPAIR  October 16, 2014     FAMILY HISTORY   History reviewed. No pertinent family history.   SOCIAL HISTORY   Social History   Tobacco Use  . Smoking status: Former Smoker    Packs/day: 2.00    Years: 20.00    Pack years: 40.00    Types: Cigarettes  . Smokeless tobacco: Never Used  Substance Use Topics  . Alcohol use: Not on file  . Drug use: Not on file     MEDICATIONS   Current Medication:  Current Facility-Administered Medications:  .  acetaminophen (TYLENOL) tablet 650 mg, 650 mg, Oral, Q6H PRN, 650 mg at 10/22/18 2100 **OR** acetaminophen (TYLENOL) suppository 650 mg, 650 mg, Rectal, Q6H PRN, Mayo, Allyn KennerKaty Dodd, MD .  aspirin EC tablet 81 mg, 81 mg, Oral, Daily, Albina BilletShanlever, Charles M, RPH, 81 mg at 11/03/18 [id number] .  atorvastatin (LIPITOR) tablet 20 mg, 20 mg, Oral, Daily, Mayo, Allyn KennerKaty Dodd, MD, 20 mg at 11/03/18 [id number] .  bacitracin ointment, , Topical, Daily, Mayo, Allyn Kenner, MD, 1 application at 11/01/18 1214 .  chlorhexidine (PERIDEX) 0.12 % solution 15 mL, 15 mL, Mouth Rinse, BID, Karna Christmas, Celia Gibbons, MD, 15 mL at 11/03/18 0919 .  Chlorhexidine Gluconate Cloth 2 % PADS 6 each, 6 each, Topical, Daily, Vida Rigger, MD, 6 each at 11/02/18 1252 .  dabigatran (PRADAXA) capsule 150 mg, 150 mg, Oral, BID, Mayo, Allyn Kenner, MD, 150 mg at 11/03/18 2841 .  furosemide (LASIX)  injection 40 mg, 40 mg, Intravenous, BID, Karna Christmas, Marah Park, MD, 40 mg at 11/03/18 0919 .  haloperidol lactate (HALDOL) injection 2 mg, 2 mg, Intravenous, Q6H PRN, Harlon Ditty D, NP, 2 mg at 10/30/18 2000 .  ipratropium-albuterol (DUONEB) 0.5-2.5 (3) MG/3ML nebulizer solution 3 mL, 3 mL, Nebulization, Q4H PRN, Mansy, Jan A, MD .  magnesium oxide (MAG-OX) tablet 400 mg, 400 mg, Oral, BID, Bertram Savin, RPH, 400 mg at 11/03/18 0920 .  MEDLINE mouth rinse, 15 mL, Mouth Rinse, q12n4p, Kynnedy Carreno, MD, 15 mL at 11/02/18 1606 .  methylPREDNISolone sodium succinate (SOLU-MEDROL) 125 mg/2 mL injection 60 mg, 60 mg, Intravenous, Q12H, Bertram Savin, RPH, 60 mg at 11/03/18 0920 .  metoprolol succinate (TOPROL-XL) 24 hr tablet 12.5 mg, 12.5 mg, Oral, BID, Mayo, Allyn Kenner, MD, 12.5 mg at 11/03/18 0920 .  mexiletine (MEXITIL) capsule 200 mg, 200 mg, Oral, Q12H, Mayo, Allyn Kenner, MD, 200 mg at 11/03/18 0919 .  morphine 2 MG/ML injection 2 mg, 2 mg, Intravenous, Q3H PRN, Harlon Ditty D, NP, 2 mg at 11/02/18 2157 .  [DISCONTINUED] ondansetron (ZOFRAN) tablet 4 mg, 4 mg, Oral, Q6H PRN **OR** ondansetron (ZOFRAN) injection 4 mg, 4 mg, Intravenous, Q6H PRN, Mayo, Allyn Kenner, MD .  oxyCODONE (Oxy IR/ROXICODONE) immediate release tablet 5 mg, 5 mg, Oral, Q4H PRN, Mayo, Allyn Kenner, MD, 5 mg at 10/29/18 0130 .  phenol (CHLORASEPTIC) mouth spray 1 spray, 1 spray, Mouth/Throat, PRN, Mody, Sital, MD .  polyethylene glycol (MIRALAX / GLYCOLAX) packet 17 g, 17 g, Oral, Daily PRN, Mayo, Allyn Kenner, MD, 17 g at 10/24/18 1357 .  venlafaxine (EFFEXOR) tablet 37.5 mg, 37.5 mg, Oral, BID WC, Mayo, Allyn Kenner, MD, 37.5 mg at 11/03/18 0920    ALLERGIES   Flomax [tamsulosin hcl]    REVIEW OF SYSTEMS    10 point ROS done and is positive for dyspnea at rest   PHYSICAL EXAMINATION   Vital Signs: Temp:  [98 F (36.7 C)-98.2 F (36.8 C)] 98 F (36.7 C) (08/27 0400) Pulse Rate:  [34-73] 73 (08/27 0920) Resp:   [13-23] 20 (08/27 0600) BP: (81-129)/(58-83) 129/83 (08/27 0920) SpO2:  [71 %-100 %] 95 % (08/27 0700) FiO2 (%):  [75 %] 75 % (08/26 2007)  GENERAL:mild distress due to dyspnea HEAD: Normocephalic, atraumatic.  EYES: Pupils equal, round, reactive to light.  No scleral icterus.  MOUTH: Moist mucosal membrane. NECK: Supple. No thyromegaly. No nodules. No JVD.  PULMONARY: bibasilar crackles CARDIOVASCULAR: S1 and S2. Regular rate and rhythm. No murmurs, rubs, or gallops.  GASTROINTESTINAL: Soft, nontender, non-distended. No masses. Positive bowel sounds. No hepatosplenomegaly.  MUSCULOSKELETAL: No swelling, clubbing, or edema.  NEUROLOGIC: Mild distress due to acute illness SKIN:intact,warm,dry   PERTINENT DATA     Infusions:  Scheduled Medications: . aspirin EC  81 mg Oral Daily  . atorvastatin  20 mg Oral Daily  . bacitracin   Topical Daily  . chlorhexidine  15 mL Mouth Rinse BID  . Chlorhexidine Gluconate Cloth  6  each Topical Daily  . dabigatran  150 mg Oral BID  . furosemide  40 mg Intravenous BID  . magnesium oxide  400 mg Oral BID  . mouth rinse  15 mL Mouth Rinse q12n4p  . methylPREDNISolone (SOLU-MEDROL) injection  60 mg Intravenous Q12H  . metoprolol succinate  12.5 mg Oral BID  . mexiletine  200 mg Oral Q12H  . venlafaxine  37.5 mg Oral BID WC   PRN Medications: acetaminophen **OR** acetaminophen, haloperidol lactate, ipratropium-albuterol, morphine injection, [DISCONTINUED] ondansetron **OR** ondansetron (ZOFRAN) IV, oxyCODONE, phenol, polyethylene glycol Hemodynamic parameters:   Intake/Output: 08/26 0701 - 08/27 0700 In: -  Out: 4818 [Urine:1225]  Ventilator  Settings: FiO2 (%):  [75 %] 75 %   Other Labs:     LAB RESULTS:  Basic Metabolic Panel: Recent Labs  Lab 10/28/18 0341 10/29/18 0451 10/30/18 0436 10/31/18 0355 11/01/18 0444 11/02/18 0351 11/03/18 0543  NA 143 145 146* 146* 143 142 143  K 4.5 4.4 3.5 3.5 3.6 4.3 4.0  CL 107 106 106  105 103 99 101  CO2 28 30 32 30 29 32 32  GLUCOSE 120* 124* 120* 100* 108* 132* 152*  BUN 48* 50* 44* 27* 21 25* 38*  CREATININE 1.09 1.16 0.99 0.78 0.68 0.76 0.82  CALCIUM 9.2 9.2 8.9 8.8* 8.6* 8.8* 8.6*  MG 2.3 2.4 2.3 1.9 1.9  --   --    Liver Function Tests: No results for input(s): AST, ALT, ALKPHOS, BILITOT, PROT, ALBUMIN in the last 168 hours. No results for input(s): LIPASE, AMYLASE in the last 168 hours. No results for input(s): AMMONIA in the last 168 hours. CBC: Recent Labs  Lab 10/28/18 0341 10/29/18 0451 11/01/18 0444 11/02/18 0351 11/03/18 0543  WBC 12.8* 10.8* 14.4* 13.3* 18.7*  NEUTROABS 11.4* 9.6* 12.1*  --   --   HGB 12.3* 11.8* 12.2* 12.4* 13.2  HCT 39.0 37.6* 38.0* 39.3 41.7  MCV 95.1 95.2 94.8 94.2 93.5  PLT 268 302 249 266 313   Cardiac Enzymes: No results for input(s): CKTOTAL, CKMB, CKMBINDEX, TROPONINI in the last 168 hours. BNP: Invalid input(s): POCBNP CBG: Recent Labs  Lab 10/28/18 2324  GLUCAP 127*     IMAGING RESULTS:  Imaging: Dg Chest Port 1 View  Result Date: 11/02/2018 CLINICAL DATA:  Pulmonary disease EXAM: PORTABLE CHEST 1 VIEW COMPARISON:  Yesterday FINDINGS: Cardiomegaly with biventricular pacer/ICD. Patchy reticular and airspace density on both sides with superimposed pleural calcifications. No visible effusion or pneumothorax. IMPRESSION: Borderline improvement in aeration but still extensive pulmonary opacity superimposed on cardiomegaly and asbestos related pleural disease. Electronically Signed   By: Monte Fantasia M.D.   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    Acute on chronic systolic CHF exacerbation -oxygen as needed -Lasix as tolerated -follow up cardiac enzymes as indicated ICU monitoring   ID -continue IV abx as prescibed -follow up cultures  GI/Nutrition GI PROPHYLAXIS as indicated DIET-->TF's as tolerated Constipation protocol as indicated  ENDO - ICU hypoglycemic\Hyperglycemia protocol -check FSBS per protocol   ELECTROLYTES -follow labs as needed -replace as needed -pharmacy consultation   DVT/GI PRX ordered -SCDs  TRANSFUSIONS AS  NEEDED MONITOR FSBS ASSESS the need for LABS as needed   Critical care provider statement:    Critical care time (minutes):  33   Critical care time was exclusive of:  Separately billable procedures and treating other patients   Critical care was necessary to treat or prevent imminent or life-threatening deterioration of the following conditions:  Acute hypoxemic respiratory failure, acute CHF exacerbation , pulmonary pneumonitis, multiple comorbid conditions   Critical care was time spent personally by me on the following activities:  Development of treatment plan with patient or surrogate, discussions with consultants, evaluation of patient's response to treatment, examination of patient, obtaining history from patient or surrogate, ordering and performing treatments and interventions, ordering and review of laboratory studies and re-evaluation of patient's condition.  I assumed direction of critical care for this patient from another provider in my specialty: no    This document was prepared using Dragon voice recognition software and may include unintentional dictation errors.    Vida RiggerFuad Madisson Kulaga, M.D.  Division of Pulmonary & Critical Care Medicine  [facility]

## 2018-11-03 NOTE — Progress Notes (Addendum)
Palliative:   [name] is resting quietly in bed.  He greets me making and somewhat keeping eye contact.  His wife, Darius Miller, is present at bedside today.  Nursing staff shares that [name] is now on 15 L high flow, reduced from 75% FiO2.  We discuss respiratory status, use of incentive spirometer.  [name] states his mouth is raw, and he has been unable to use incentive spirometer. Magic mouthwash ordered.  We talked about continued treatment plan including mobility, improving respiratory function through steroid and IS.  We talked about pain management.  [name] states his pain can be anywhere from a 4 to a 9 before he asks for morphine.  I encouraged him to not let pain levels get too high before asking for medication.  We briefly talked about scheduling morphine, but since he only uses pain medication twice a day, there would be no benefit.  Darius Miller talks about discharge planning and possibility of rehab.  [name] states that at this point he is concerned is how he and his wife will be able to care for their 4 year old 35 pound dog.  I shared that this adds an extra layer stress during his recovery.  At this point, it remains to be seen how [name] will be able to recover.  55 minutes Darius Axe, NP Palliative Medicine Team Team Phone # [phone] Greater than 50% of this time was spent counseling and coordinating care related to the above assessment and plan.

## 2018-11-04 LAB — CBC WITH DIFFERENTIAL/PLATELET
Abs Immature Granulocytes: 0.14 10*3/uL — ABNORMAL HIGH (ref 0.00–0.07)
Basophils Absolute: 0 10*3/uL (ref 0.0–0.1)
Basophils Relative: 0 %
Eosinophils Absolute: 0 10*3/uL (ref 0.0–0.5)
Eosinophils Relative: 0 %
HCT: 40.5 % (ref 39.0–52.0)
Hemoglobin: 12.6 g/dL — ABNORMAL LOW (ref 13.0–17.0)
Immature Granulocytes: 1 %
Lymphocytes Relative: 2 %
Lymphs Abs: 0.5 10*3/uL — ABNORMAL LOW (ref 0.7–4.0)
MCH: 29.9 pg (ref 26.0–34.0)
MCHC: 31.1 g/dL (ref 30.0–36.0)
MCV: 96 fL (ref 80.0–100.0)
Monocytes Absolute: 0.7 10*3/uL (ref 0.1–1.0)
Monocytes Relative: 4 %
Neutro Abs: 17.2 10*3/uL — ABNORMAL HIGH (ref 1.7–7.7)
Neutrophils Relative %: 93 %
Platelets: 304 10*3/uL (ref 150–400)
RBC: 4.22 MIL/uL (ref 4.22–5.81)
RDW: 14.6 % (ref 11.5–15.5)
WBC: 18.5 10*3/uL — ABNORMAL HIGH (ref 4.0–10.5)
nRBC: 0 % (ref 0.0–0.2)

## 2018-11-04 LAB — BASIC METABOLIC PANEL
Anion gap: 7 (ref 5–15)
BUN: 47 mg/dL — ABNORMAL HIGH (ref 8–23)
CO2: 34 mmol/L — ABNORMAL HIGH (ref 22–32)
Calcium: 8.5 mg/dL — ABNORMAL LOW (ref 8.9–10.3)
Chloride: 104 mmol/L (ref 98–111)
Creatinine, Ser: 1 mg/dL (ref 0.61–1.24)
GFR calc Af Amer: >60 mL/min (ref >60)
GFR calc non Af Amer: >60 mL/min (ref >60)
Glucose, Bld: 152 mg/dL — ABNORMAL HIGH (ref 70–99)
Potassium: 4.5 mmol/L (ref 3.5–5.1)
Sodium: 145 mmol/L (ref 135–145)

## 2018-11-04 LAB — GLUCOSE, CAPILLARY: Glucose-Capillary: 126 mg/dL — ABNORMAL HIGH (ref 70–99)

## 2018-11-04 LAB — FUNGITELL, SERUM: Fungitell Result: <31 pg/mL (ref <80)

## 2018-11-04 MED ORDER — ENSURE ENLIVE PO LIQD
237.0000 mL | Freq: Two times a day (BID) | ORAL | Status: DC
  Administered 2018-11-06 – 2018-11-09 (×4): 237 mL via ORAL

## 2018-11-04 MED ORDER — METHYLPREDNISOLONE SODIUM SUCC 40 MG IJ SOLR
40.0000 mg | Freq: Two times a day (BID) | INTRAMUSCULAR | Status: DC
  Administered 2018-11-04 – 2018-11-05 (×3): 40 mg via INTRAVENOUS
  Filled 2018-11-04 (×3): qty 1

## 2018-11-04 MED ORDER — SULFAMETHOXAZOLE-TRIMETHOPRIM 400-80 MG/5ML IV SOLN
160.0000 mg | Freq: Two times a day (BID) | INTRAVENOUS | Status: DC
  Administered 2018-11-04 – 2018-11-05 (×2): 160 mg via INTRAVENOUS
  Filled 2018-11-04 (×3): qty 10

## 2018-11-04 MED ORDER — ADULT MULTIVITAMIN W/MINERALS CH
1.0000 | ORAL_TABLET | Freq: Every day | ORAL | Status: DC
  Administered 2018-11-05 – 2018-11-09 (×5): 1 via ORAL
  Filled 2018-11-04 (×5): qty 1

## 2018-11-04 NOTE — Evaluation (Signed)
Occupational Therapy Evaluation Patient Details Name: Darius Miller MRN: [id number] DOB: [birth year] Today's Date: 11/04/2018    History of Present Illness Pt admitted 08/15 s/p fall resulting in rib fracture (2nd and 3rd)  transferred to ICU 08/19 due to decline in respiratory status,  He was placed on BiPAP.  Chest x-ray reveals patchy bilateral infiltrates. PMH of afib, HTN, ICD, kidney stones, stroke   Clinical Impression   Mr. Dorminy was seen for OT evaluation this date. Pt was active and independent in all ADLs and mobility, living in a 1 story home with a level entrance. Pt reports staying active, walking his dogs regularly and not using an AD for ambulation. He also reports multiple falls in the past 3 months all occurring at different points during his daily routines. Pt states he has noted he will stand and immediately lean toward the right on occasion. He states this has been going on for the past "several months". Pt not using O2 in the home prior to admission. Pt currently requires min to moderate assistance for ADL management and functional mobility due to poor activity tolerance and generalized weakness throughout BUE/BLE. Pt noted to de-sat with attempt at ambulation on this date despite being on 11L HFNC. Pt educated in energy conservation conservation strategies including pursed lip breathing, activity pacing, and falls prevention strategies. Pt verbalized understanding but would benefit from additional skilled OT services to maximize recall and carryover of learned techniques and facilitate implementation of learned techniques into daily routines. Upon discharge, recommend STR to maximize pt safety and return to PLOF.       Follow Up Recommendations  SNF    Equipment Recommendations  3 in 1 bedside commode    Recommendations for Other Services       Precautions / Restrictions Precautions Precautions: Fall Restrictions Weight Bearing Restrictions: No      Mobility Bed  Mobility Overal bed mobility: Needs Assistance Bed Mobility: Sit to Supine     Supine to sit: Min assist;HOB elevated Sit to supine: Mod assist;HOB elevated   General bed mobility comments: Pt req. moderate assist to bring BLEs into bed on this date.  Transfers Overall transfer level: Needs assistance Equipment used: Rolling walker (2 wheeled) Transfers: Sit to/from Stand Sit to Stand: Min assist         General transfer comment: Pt req. min physical assist along with cueing for hand and foot placement in order to stand from room recliner on this date. Pt had difficulty clearing hips off seated surface. Attempted x3 and was successful on 3rd attempt with physical assist from this therapist.    Balance Overall balance assessment: History of Falls;Needs assistance Sitting-balance support: Feet supported;Bilateral upper extremity supported Sitting balance-Leahy Scale: Fair       Standing balance-Leahy Scale: Poor Standing balance comment: reliant on UE on RW                           ADL either performed or assessed with clinical judgement   ADL Overall ADL's : Needs assistance/impaired Eating/Feeding: Set up;Sitting;Independent   Grooming: Set up;Sitting;Supervision/safety   Upper Body Bathing: Minimal assistance;Set up;Sitting   Lower Body Bathing: Set up;Minimal assistance;Sit to/from stand;Moderate assistance   Upper Body Dressing : Set up;Supervision/safety;Sitting   Lower Body Dressing: Set up;Minimal assistance;Moderate assistance;Sit to/from stand   Toilet Transfer: Engineer, water Details (indicate cue type and reason): Pt very limited by cardiopulmonatry status. Consistent cues for PLB required during  functional transfers on this date.         Functional mobility during ADLs: Rolling walker;Min guard;Minimal assistance       Vision Baseline Vision/History: Wears glasses Wears Glasses: Distance only Patient Visual Report:  No change from baseline       Perception     Praxis      Pertinent Vitals/Pain Pain Assessment: Faces Faces Pain Scale: Hurts a little bit Pain Location: rib pain, back pain Pain Descriptors / Indicators: Aching;Sore Pain Intervention(s): Limited activity within patient's tolerance;Monitored during session;Repositioned     Hand Dominance Right   Extremity/Trunk Assessment Upper Extremity Assessment Upper Extremity Assessment: Generalized weakness(Grip/ROM WFL in BUE. Unable to tolerate MMT on this date 2/2 increased pain. Will continue to assess functional UE use. Pt noted to fatique quickly, unable to hold position above midline w/o challenge.)   Lower Extremity Assessment Lower Extremity Assessment: Defer to PT evaluation;Generalized weakness   Cervical / Trunk Assessment Cervical / Trunk Assessment: Kyphotic   Communication Communication Communication: No difficulties   Cognition Arousal/Alertness: Awake/alert Behavior During Therapy: WFL for tasks assessed/performed Overall Cognitive Status: No family/caregiver present to determine baseline cognitive functioning                                 General Comments: Pt oriented to self, place, disoriented to time   General Comments  Pt SpO2 at rest on 11 L HFNC was 100. Pt noted to desat to 87 with mobility attempt. Rebounded quickly back to 100 after rest break and cues for PLB. RN notified.    Exercises Other Exercises Other Exercises: Pt educated on energy conservation strategies including PLB and pacing for improved activity tolerance. Pt able to return demonstrate understanding of PLB strategy t/o session. Other Exercises: Pt educated about importance of mobility, up in chair, repositioned with pillows to promote comfort/decrease rib pain Other Exercises: Pt educated in safe transfer techniqus including feet and hand placement during STS. OT helped pt back to bed from room recliner. RN notified.    Shoulder Instructions      Home Living Family/patient expects to be discharged to:: Private residence Living Arrangements: Spouse/significant other Available Help at Discharge: Family;Available 24 hours/day Type of Home: House Home Access: Level entry     Home Layout: One level     Bathroom Shower/Tub: Chief Strategy OfficerTub/shower unit   Bathroom Toilet: Standard     Home Equipment: Cane - single point;Walker - 2 wheels          Prior Functioning/Environment Level of Independence: Independent        Comments: Pt states he is independent with ADL/IADL mgt. Driving, community ambulator. Not using AD for ambulation. Endorses multiple falls in the past 6 months including 3 falls in one week.        OT Problem List: Decreased strength;Decreased coordination;Cardiopulmonary status limiting activity;Decreased range of motion;Decreased cognition;Decreased activity tolerance;Decreased safety awareness;Decreased knowledge of use of DME or AE;Impaired balance (sitting and/or standing);Decreased knowledge of precautions      OT Treatment/Interventions: Self-care/ADL training;Therapeutic exercise;Therapeutic activities;Energy conservation;DME and/or AE instruction;Patient/family education;Balance training    OT Goals(Current goals can be found in the care plan section) Acute Rehab OT Goals Patient Stated Goal: to go home OT Goal Formulation: With patient Time For Goal Achievement: 11/18/18 Potential to Achieve Goals: Good ADL Goals Pt Will Perform Lower Body Bathing: with set-up;with supervision;sit to/from stand(With LRAD PRN for improved safety and functional independence) Pt Will  Perform Lower Body Dressing: with supervision;with set-up;with adaptive equipment;sit to/from stand(With LRAD PRN for improved safety and functional independence) Pt Will Transfer to Toilet: with supervision;ambulating;bedside commode(With LRAD PRN for improved safety and functional independence) Additional ADL Goal #1:  Pt will independently verbalize a plan to implement at least 3 learned falls prevention strategies into his home environment/daily routines for improved safety and functional independence upon hospital DC.  OT Frequency: Min 1X/week   Barriers to D/C:            Co-evaluation              AM-PAC OT "6 Clicks" Daily Activity     Outcome Measure Help from another person eating meals?: A Little Help from another person taking care of personal grooming?: A Little Help from another person toileting, which includes using toliet, bedpan, or urinal?: A Lot Help from another person bathing (including washing, rinsing, drying)?: A Lot Help from another person to put on and taking off regular upper body clothing?: A Little Help from another person to put on and taking off regular lower body clothing?: A Lot 6 Click Score: 15   End of Session Equipment Utilized During Treatment: Gait belt;Rolling walker  Activity Tolerance: Patient limited by fatigue;Patient tolerated treatment well Patient left: in bed;with call bell/phone within reach;with bed alarm set;Other (comment)(With HFNC in place.)  OT Visit Diagnosis: Other abnormalities of gait and mobility (R26.89);Repeated falls (R29.6);Muscle weakness (generalized) (M62.81)                Time: 3532-9924 OT Time Calculation (min): 33 min Charges:  OT General Charges $OT Visit: 1 Visit OT Evaluation $OT Eval Moderate Complexity: 1 Mod OT Treatments $Self Care/Home Management : 23-37 mins  Shara Blazing, M.S., OTR/L Ascom: [phone] 11/04/18, 3:04 PM

## 2018-11-04 NOTE — Progress Notes (Signed)
Pharmacy Electrolyte Monitoring Consult:  Pharmacy consulted to assist in monitoring and replacing electrolytes in this 73 y.o. male admitted on 10/22/2018 with hypoxemic respiratory failure and respiratory distress secondary to pneumonitis secondary to chronic silent aspiration. 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 History significant for atrial fibrillation, coronary artery disease s/p RCA stents, mitral valve repair, ventricular tachycardia, stroke, and kidney stones. Patient was on antibiotics when pneumonia was suspected but has finished antibiotic therapy. 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   Labs:  Sodium (mmol/L)  Date Value  11/04/2018 145   Potassium (mmol/L)  Date Value  11/04/2018 4.5   Magnesium (mg/dL)  Date Value  11/01/2018 1.9   Calcium (mg/dL)  Date Value  11/04/2018 8.5 (L)   Albumin (g/dL)  Date Value  10/22/2018 3.7     Assessment/Plan: Furosemide 40 mg IV BID continued. Magnesium oxide 400 mg PO BID also continued.   Electrolytes WNL and do not warrant replacement at this time.  Due to extensive cardiac issues, will replace for goal potassium ~ 4 and goal magnesium ~ 2.   Will obtain BMP with AM labs and magnesium as warranted.  Pharmacy will continue to monitor and adjust per consult.   Darius Miller Dear Darius Miller 11/04/2018 12:10 PM

## 2018-11-04 NOTE — Progress Notes (Addendum)
Physical Therapy Treatment Patient Details Name: Darius Miller MRN: [id number] DOB: 05-17-1945 Today's Date: 11/04/2018    History of Present Illness Pt admitted 08/15 s/p fall resulting in rib fracture (2nd and 3rd)  transferred to ICU 08/19 due to decline in respiratory status,  He was placed on BiPAP.  Chest x-ray reveals patchy bilateral infiltrates. PMH of afib, HTN, ICD, kidney stones, stroke    PT Comments    Patient on HFNC with green tubing at start of session, 11L, reported pain in back/ribs due to rib fracture. Patient initially laying flat in bed, bed repositioned with HOB elevated, spO2 response assessed, WFLs. Pt transferred to sit EOB with minA (handheld assist). Able to sit EOB for several minutes with fair balance. Reported fatigue but with encouragement pt agreeable to transfer to recliner. Sit <> stand with CGAx2 and RW, pt was able to take several shuffling steps to chair with CGA. Pt repositioned in chair with extensive pillows to support/address rib pain and pt set up for lunch. The patient demonstrated improvement with mobility this session and would continue to benefit from further skilled PT intervention to maximize function, safety, and mobility. Of note, pt would benefit from OT to assess ability/enhance ability to perform ADLs/energy conservation techniques, consult placed.     Follow Up Recommendations  SNF     Equipment Recommendations  Rolling walker with 5" wheels    Recommendations for Other Services OT consult     Precautions / Restrictions Precautions Precautions: Fall Restrictions Weight Bearing Restrictions: No    Mobility  Bed Mobility Overal bed mobility: Needs Assistance Bed Mobility: Supine to Sit     Supine to sit: Min assist;HOB elevated     General bed mobility comments: handheld assist from [facility] elevated  Transfers Overall transfer level: Needs assistance   Transfers: Sit to/from Stand Sit to Stand: Min guard          General transfer comment: CGAx2 with RW, some shakiness noted but no LOB  Ambulation/Gait Ambulation/Gait assistance: Min guard;+2 safety/equipment Gait Distance (Feet): 2 Feet Assistive device: Rolling walker (2 wheeled)       General Gait Details: Pt able to take several shuffling steps towards recliner with RW and CGA. Cued for eccentric control to lower to chair   Stairs             Wheelchair Mobility    Modified Rankin (Stroke Patients Only)       Balance Overall balance assessment: History of Falls;Needs assistance Sitting-balance support: Feet supported;Bilateral upper extremity supported Sitting balance-Leahy Scale: Fair       Standing balance-Leahy Scale: Poor Standing balance comment: reliant on UE on RW                            Cognition Arousal/Alertness: Awake/alert Behavior During Therapy: WFL for tasks assessed/performed Overall Cognitive Status: No family/caregiver present to determine baseline cognitive functioning                                 General Comments: Pt oriented to self, place, disoriented to time      Exercises Other Exercises Other Exercises: Educated about PLB Other Exercises: Pt educated about importance of mobility, up in chair, repositioned with pillows to promote comfort/decrease rib pain    General Comments        Pertinent Vitals/Pain Pain Assessment: Faces Faces Pain Scale: Hurts  a little bit Pain Location: rib pain, back pain Pain Descriptors / Indicators: Aching Pain Intervention(s): Limited activity within patient's tolerance;Monitored during session;Repositioned    Home Living                      Prior Function            PT Goals (current goals can now be found in the care plan section) Progress towards PT goals: Progressing toward goals    Frequency    Min 2X/week      PT Plan Current plan remains appropriate    Co-evaluation               AM-PAC PT "6 Clicks" Mobility   Outcome Measure  Help needed turning from your back to your side while in a flat bed without using bedrails?: A Lot Help needed moving from lying on your back to sitting on the side of a flat bed without using bedrails?: A Lot Help needed moving to and from a bed to a chair (including a wheelchair)?: A Little Help needed standing up from a chair using your arms (e.g., wheelchair or bedside chair)?: A Little Help needed to walk in hospital room?: A Lot Help needed climbing 3-5 steps with a railing? : Total 6 Click Score: 13    End of Session Equipment Utilized During Treatment: Oxygen Activity Tolerance: Patient limited by fatigue Patient left: in bed;with bed alarm set;with call bell/phone within reach Nurse Communication: Mobility status PT Visit Diagnosis: Muscle weakness (generalized) (M62.81);Repeated falls (R29.6);History of falling (Z91.81);Difficulty in walking, not elsewhere classified (R26.2);Pain Pain - Right/Left: Right     Time: 1791-5056 PT Time Calculation (min) (ACUTE ONLY): 24 min  Charges:  $Therapeutic Exercise: 8-22 mins                     Olga Coaster PT, DPT 1:14 PM,11/04/18 [phone]

## 2018-11-04 NOTE — Progress Notes (Signed)
[place] at [place] NAME: Darius Miller    MR#:  [id number]  DATE OF BIRTH:  19-Dec-1945  SUBJECTIVE:   Transferred to ICU 8/19 due to increasing shortness of breath after being admitted on 8/15 with rib fractures from mechanical fall.  On less O2. Some improvement.  REVIEW OF SYSTEMS:  He denies any fever  He denies blurring of eye or double vision. Denies ringing in the ears. He denies any cough but has shortness of breath and feels comfortable with high flow nasal cannula. He denies chest pain, palpitation or edema. He denies abdominal pain, nausea, vomiting, diarrhea He denies any focal neurological weakness. He denies depression or suicidal ideation.  Tolerating Diet: yes  DRUG ALLERGIES:   Allergies  Allergen Reactions  . Flomax [Tamsulosin Hcl]     VITALS:  Blood pressure (!) 88/63, pulse 65, temperature 98.7 F (37.1 C), temperature source Oral, resp. rate (!) 28, height 5\' 11"  (1.803 m), weight 74 kg, SpO2 96 %.  PHYSICAL EXAMINATION:   Constitutional: Appears well-developed  HENT: Normocephalic. Marland Kitchen Oropharynx is clear and moist.  Eyes: Conjunctivae and EOM are normal. PERRLA, no scleral icterus.  Neck: Normal ROM. Neck supple. No JVD. No tracheal deviation. CVS: RRR, S1/S2 +, no murmurs, no gallops, no carotid bruit.  Pulmonary: Decreased breath sounds throughout lung fields. Some crepitations.  High flow nasal cannula oxygen.  Left base wheezing Abdominal: Soft. BS +,  no distension, tenderness, rebound or guarding.  Musculoskeletal: Normal range of motion. No edema and no tenderness.  Neuro: Alert. CN 2-12 grossly intact. No focal deficits. Skin: Skin is warm and dry. No rash noted. Psychiatric: Alert and oriented x3.  LABORATORY PANEL:   CBC Recent Labs  Lab 11/04/18 0706  WBC 18.5*  HGB 12.6*  HCT 40.5  PLT 304    ------------------------------------------------------------------------------------------------------------------  Chemistries  Recent Labs  Lab 11/01/18 0444  11/04/18 0706  NA 143   < > 145  K 3.6   < > 4.5  CL 103   < > 104  CO2 29   < > 34*  GLUCOSE 108*   < > 152*  BUN 21   < > 47*  CREATININE 0.68   < > 1.00  CALCIUM 8.6*   < > 8.5*  MG 1.9  --   --    < > = values in this interval not displayed.   ------------------------------------------------------------------------------------------------------------------  Cardiac Enzymes No results for input(s): TROPONINI in the last 168 hours. ------------------------------------------------------------------------------------------------------------------  RADIOLOGY:     ASSESSMENT AND PLAN:   73 year old male with PAF and CAD who presented after mechanical fall and suffered rib fracture.  1. . Acute respiratory failure, hypoxic: This is due to combination of atelectasis from rib fractures and pneumonia.  Finished course of unasyn needed BiPAP and now on HFNC CT chest showed no evidence of PE, however did show pneumonia Less likely pulmonary edema. No improvement with diuresis Repeat covid 19 testing negative As per pulmonologist, esophagus is dilated in past studies, may be having aspirations repeatedly. Repeat chest x-ray with no significant change  2..  Rib fractures: Patient is being treated conservatively.  Rib fractures are status post mechanical fall.  CT head and CT C-spine were negative for acute fracture. Continue incentive spirometer.  3.  PAF with history of sustained V. 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  CAD status post stents: Continue aspirin, metoprolol, and Lipitor.  5.  Acute on chronic diastolic chf Diuresed and now euvolemic  6.  COPD without signs of exacerbation:  Continue inhalers  7.  Essential hypertension: Patient will continue on metoprolol     Management plans discussed with the patient and he is in agreement.  CODE STATUS: DNR/DNI  TOTAL TIME TAKING CARE OF THIS PATIENT: 32 minutes.   Continue ICU care as patient is still on high flow nasal cannula. Discussed with Dr. Terrace Arabia M.D on 11/01/2018 at 4:49 PM  Between 7am to 6pm - Pager - [phone]  After 6pm go to [email or web address] - password [email or web address]  [facility]  Office  [phone]  CC: Primary care physician; Gracelyn Nurse, MD  Note: This dictation was prepared with Dragon dictation along with smaller phrase technology. Any transcriptional errors that result from this process are unintentional.

## 2018-11-04 NOTE — Progress Notes (Signed)
Shift Summary:  Patient continues to tolerate wall bubble nasal cannula at 10 liters per minute. Last oxygen saturation measured 96%.  Order for runs of IV potassium chloride completed at 0200.  One dose of PRN morphine was given last evening for rib pain. Patient has not complained of pain since then.  Will continue to monitor.  Cameron Ali, RN

## 2018-11-04 NOTE — Progress Notes (Signed)
CRITICAL CARE PROGRESS NOTE    Name: Adonai Selsor MRN: [id number] DOB: [birth year]     LOS: 10   SUBJECTIVE FINDINGS & SIGNIFICANT EVENTS   Patient description:  Pt admitted 08/15 s/p fall resulting in a rib fracture transferred to ICU 08/19 with worsening hypoxemic respiratory failure and respiratory distress.  He was placed on BiPAP.  Chest x-ray reveals patchy bilateral infiltrates.  CT of chest from 8/18 reveals patchy, predominantly interstitial infiltrates in both lungs likely pneumonitis in the setting of chronic aspiration although there is concern this could be amiodarone induced   Lines / Drains: PIV  Cultures / Sepsis markers: Blood cx neg up to date , procalcitonin mildly elevated .6  Antibiotics: 10/22/2018: ED s/p fall. 10/23/2018: Continued to be febrile. Required O2 via [place]. CSW with recommendation to transfer to SNF - [facility] 10/25/2018: Plan was to discharge to [facility] today. He was hypoxic (60's on 2L oxygen). Started on high flow. 1 dose Lasix given. [facility] came to pick him up. Patient at that time was on 10 L high flow with SpO2 @ 86%. Kept in hospital.  BNP baseline 346.0. CXR completed --> stable cardiomegaly, bronchovascular crowding and vascular congestion, and bilateral calcified pleural plaques.  10/26/18: On High Flow - to 55L @ 100% FiO2. Started on Cefepime.  9 AM patient on HFNC 55 lpm @ 100% - with O2 saturation in the high 80's, and was lethargic. Patient admitted to ICU for further management. 10/28/18: . Pt and pts wife states after pt eats he has frequent episodes of coughing and he also endorses problems with acid reflux.  Therefore, cefepime and doxycycline stopped and started unasyn for aspiration  10/30/18: Tolerating 40L @ 60%    Overnight:  Patient  improved now on 10L [place]  Continue  steroids for amio tox   PAST MEDICAL HISTORY   Past Medical History:  Diagnosis Date  . Atrial fibrillation ([place])   . Coronary artery disease  s/p RCA stents   . History of mitral valve repair   . Hypertension   . 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 Kidney stones   . Stroke ([place])   . Ventricular tachycardia ([place])      SURGICAL HISTORY   Past Surgical History:  Procedure Laterality Date  . CARDIAC CATHETERIZATION    . CORONARY ANGIOPLASTY    . Coronary stent 2009  2009  . ICD IMPLANT    . MITRAL VALVE REPAIR  October 16, 2014     FAMILY HISTORY   History reviewed. No pertinent family history.   SOCIAL HISTORY   Social History   Tobacco Use  . Smoking status: Former Smoker    Packs/day: 2.00    Years: 20.00    Pack years: 40.00    Types: Cigarettes  . Smokeless tobacco: Never Used  Substance Use Topics  . Alcohol use: Not on file  . Drug use: Not on file     MEDICATIONS   Current Medication:  Current Facility-Administered Medications:  .  acetaminophen (TYLENOL) tablet 650 mg, 650 mg, Oral, Q6H PRN, 650 mg at 10/22/18 2100 **OR** acetaminophen (TYLENOL) suppository 650 mg, 650 mg, Rectal, Q6H PRN, Mayo, Pete Pelt, MD .  aspirin EC tablet 81 mg, 81 mg, Oral, Daily, Lu Duffel, RPH, 81 mg at 11/03/18 7106 .  atorvastatin (LIPITOR) tablet 20 mg, 20 mg, Oral, Daily, Mayo, Pete Pelt, MD, 20 mg at 11/03/18 2694 .  bacitracin  ointment, , Topical, Daily, Mayo, Allyn Kenner, MD, 1 application at 11/01/18 1214 .  Chlorhexidine Gluconate Cloth 2 % PADS 6 each, 6 each, Topical, Daily, Vida Rigger, MD, 6 each at 11/02/18 1252 .  dabigatran (PRADAXA) capsule 150 mg, 150 mg, Oral, BID, Mayo, Allyn Kenner, MD, 150 mg at 11/03/18 2109 .  docusate sodium (COLACE) capsule 100 mg, 100 mg, Oral, BID, Jaquanda Wickersham, MD, 100 mg at 11/03/18 2107 .  furosemide (LASIX) injection 40 mg,  40 mg, Intravenous, BID, Karna Christmas, Dortha Neighbors, MD, 40 mg at 11/03/18 1628 .  haloperidol lactate (HALDOL) injection 2 mg, 2 mg, Intravenous, Q6H PRN, Harlon Ditty D, NP, 2 mg at 10/30/18 2000 .  ipratropium-albuterol (DUONEB) 0.5-2.5 (3) MG/3ML nebulizer solution 3 mL, 3 mL, Nebulization, Q4H PRN, Mansy, Jan A, MD .  magic mouthwash, 5 mL, Oral, QID, Dove, Tasha A, NP, 5 mL at 11/03/18 2114 .  magnesium oxide (MAG-OX) tablet 400 mg, 400 mg, Oral, BID, Bertram Savin, RPH, 400 mg at 11/03/18 2108 .  methylPREDNISolone sodium succinate (SOLU-MEDROL) 125 mg/2 mL injection 60 mg, 60 mg, Intravenous, Q12H, Bertram Savin, RPH, 60 mg at 11/03/18 1915 .  metoprolol succinate (TOPROL-XL) 24 hr tablet 12.5 mg, 12.5 mg, Oral, BID, Mayo, Allyn Kenner, MD, 12.5 mg at 11/03/18 2108 .  mexiletine (MEXITIL) capsule 200 mg, 200 mg, Oral, Q12H, Mayo, Allyn Kenner, MD, 200 mg at 11/03/18 2109 .  morphine 2 MG/ML injection 2 mg, 2 mg, Intravenous, Q3H PRN, Harlon Ditty D, NP, 2 mg at 11/03/18 1918 .  [DISCONTINUED] ondansetron (ZOFRAN) tablet 4 mg, 4 mg, Oral, Q6H PRN **OR** ondansetron (ZOFRAN) injection 4 mg, 4 mg, Intravenous, Q6H PRN, Mayo, Allyn Kenner, MD .  oxyCODONE (Oxy IR/ROXICODONE) immediate release tablet 5 mg, 5 mg, Oral, Q4H PRN, Mayo, Allyn Kenner, MD, 5 mg at 10/29/18 0130 .  phenol (CHLORASEPTIC) mouth spray 1 spray, 1 spray, Mouth/Throat, PRN, Mody, Sital, MD .  polyethylene glycol (MIRALAX / GLYCOLAX) packet 17 g, 17 g, Oral, Daily, Sudini, Srikar, MD .  potassium chloride SA (K-DUR) CR tablet 40 mEq, 40 mEq, Oral, Once, Pricilla Riffle, RPH .  venlafaxine (EFFEXOR) tablet 37.5 mg, 37.5 mg, Oral, BID WC, Mayo, Allyn Kenner, MD, 37.5 mg at 11/03/18 1629    ALLERGIES   Flomax [tamsulosin hcl]    REVIEW OF SYSTEMS    10 point ROS done and is positive for dyspnea at rest   PHYSICAL EXAMINATION   Vital Signs: Temp:  [97.6 F (36.4 C)-98.6 F (37 C)] 97.6 F (36.4 C) (08/28 0200) Pulse  Rate:  [58-70] 67 (08/28 0700) Resp:  [11-26] 19 (08/28 0700) BP: (95-140)/(59-89) 126/70 (08/28 0600) SpO2:  [83 %-98 %] 94 % (08/28 0700) FiO2 (%):  [70 %] 70 % (08/27 1000)  GENERAL:mild distress due to dyspnea HEAD: Normocephalic, atraumatic.  EYES: Pupils equal, round, reactive to light.  No scleral icterus.  MOUTH: Moist mucosal membrane. NECK: Supple. No thyromegaly. No nodules. No JVD.  PULMONARY: bibasilar crackles CARDIOVASCULAR: S1 and S2. Regular rate and rhythm. No murmurs, rubs, or gallops.  GASTROINTESTINAL: Soft, nontender, non-distended. No masses. Positive bowel sounds. No hepatosplenomegaly.  MUSCULOSKELETAL: No swelling, clubbing, or edema.  NEUROLOGIC: Mild distress due to acute illness SKIN:intact,warm,dry   PERTINENT DATA     Infusions:  Scheduled Medications: . aspirin EC  81 mg Oral Daily  . atorvastatin  20 mg Oral Daily  . bacitracin   Topical Daily  . Chlorhexidine Gluconate Cloth  6 each Topical  Daily  . dabigatran  150 mg Oral BID  . docusate sodium  100 mg Oral BID  . furosemide  40 mg Intravenous BID  . magic mouthwash  5 mL Oral QID  . magnesium oxide  400 mg Oral BID  . methylPREDNISolone (SOLU-MEDROL) injection  60 mg Intravenous Q12H  . metoprolol succinate  12.5 mg Oral BID  . mexiletine  200 mg Oral Q12H  . polyethylene glycol  17 g Oral Daily  . potassium chloride  40 mEq Oral Once  . venlafaxine  37.5 mg Oral BID WC   PRN Medications: acetaminophen **OR** acetaminophen, haloperidol lactate, ipratropium-albuterol, morphine injection, [DISCONTINUED] ondansetron **OR** ondansetron (ZOFRAN) IV, oxyCODONE, phenol Hemodynamic parameters:   Intake/Output: 08/27 0701 - 08/28 0700 In: 508.5 [P.O.:120; IV Piggyback:388.5] Out: 1475 [Urine:1475]  Ventilator  Settings: FiO2 (%):  [70 %] 70 %   Other Labs:     LAB RESULTS:  Basic Metabolic Panel: Recent Labs  Lab 10/29/18 0451 10/30/18 0436 10/31/18 0355 11/01/18 0444  11/02/18 0351 11/03/18 0543 11/04/18 0706  NA 145 146* 146* 143 142 143 145  K 4.4 3.5 3.5 3.6 4.3 4.0 4.5  CL 106 106 105 103 99 101 104  CO2 30 32 30 29 32 32 34*  GLUCOSE 124* 120* 100* 108* 132* 152* 152*  BUN 50* 44* 27* 21 25* 38* 47*  CREATININE 1.16 0.99 0.78 0.68 0.76 0.82 1.00  CALCIUM 9.2 8.9 8.8* 8.6* 8.8* 8.6* 8.5*  MG 2.4 2.3 1.9 1.9  --   --   --    Liver Function Tests: No results for input(s): AST, ALT, ALKPHOS, BILITOT, PROT, ALBUMIN in the last 168 hours. No results for input(s): LIPASE, AMYLASE in the last 168 hours. No results for input(s): AMMONIA in the last 168 hours. CBC: Recent Labs  Lab 10/29/18 0451 11/01/18 0444 11/02/18 0351 11/03/18 0543 11/04/18 0706  WBC 10.8* 14.4* 13.3* 18.7* 18.5*  NEUTROABS 9.6* 12.1*  --   --  17.2*  HGB 11.8* 12.2* 12.4* 13.2 12.6*  HCT 37.6* 38.0* 39.3 41.7 40.5  MCV 95.2 94.8 94.2 93.5 96.0  PLT 302 249 266 313 304   Cardiac Enzymes: No results for input(s): CKTOTAL, CKMB, CKMBINDEX, TROPONINI in the last 168 hours. BNP: Invalid input(s): POCBNP CBG: Recent Labs  Lab 10/28/18 2324 11/04/18 0734  GLUCAP 127* 126*     IMAGING RESULTS:  Imaging: No results found. @PROBHOSP @   ASSESSMENT AND PLAN    -Multidisciplinary rounds held today  Acute Hypoxic Respiratory Failure -procalcitonin mildly elevated  -patient on chronic amiodarone 400 bid  -s/p >5L diuresis improved  - possible CHF exacerbation as patient had improvement in BNP and clinically post diuresis -patient and wife share multiple aspiration events sometime  "3 times per month".    Acute on chronic systolic CHF exacerbation -oxygen as needed -Lasix as tolerated -follow up cardiac enzymes as indicated ICU monitoring   ID -continue IV abx as prescibed -follow up cultures  GI/Nutrition GI PROPHYLAXIS as indicated DIET-->TF's as tolerated Constipation protocol as indicated  ENDO - ICU hypoglycemic\Hyperglycemia protocol -check  FSBS per protocol   ELECTROLYTES -follow labs as needed -replace as needed -pharmacy consultation   DVT/GI PRX ordered -SCDs  TRANSFUSIONS AS NEEDED MONITOR FSBS ASSESS the need for LABS as needed   Critical care provider statement:    Critical care time (minutes):  36   Critical care time was exclusive of:  Separately billable procedures and treating other patients   Critical care was  necessary to treat or prevent imminent or life-threatening deterioration of the following conditions:  Acute hypoxemic respiratory failure, acute CHF exacerbation , pulmonary pneumonitis, multiple comorbid conditions   Critical care was time spent personally by me on the following activities:  Development of treatment plan with patient or surrogate, discussions with consultants, evaluation of patient's response to treatment, examination of patient, obtaining history from patient or surrogate, ordering and performing treatments and interventions, ordering and review of laboratory studies and re-evaluation of patient's condition.  I assumed direction of critical care for this patient from another provider in my specialty: no    This document was prepared using Dragon voice recognition software and may include unintentional dictation errors.    Vida RiggerFuad Gianlucas Evenson, M.D.  Division of Pulmonary & Critical Care Medicine  [facility]

## 2018-11-05 LAB — CBC WITH DIFFERENTIAL/PLATELET
Abs Immature Granulocytes: 0.13 10*3/uL — ABNORMAL HIGH (ref 0.00–0.07)
Basophils Absolute: 0 10*3/uL (ref 0.0–0.1)
Basophils Relative: 0 %
Eosinophils Absolute: 0 10*3/uL (ref 0.0–0.5)
Eosinophils Relative: 0 %
HCT: 40.9 % (ref 39.0–52.0)
Hemoglobin: 12.9 g/dL — ABNORMAL LOW (ref 13.0–17.0)
Immature Granulocytes: 1 %
Lymphocytes Relative: 3 %
Lymphs Abs: 0.5 10*3/uL — ABNORMAL LOW (ref 0.7–4.0)
MCH: 29.9 pg (ref 26.0–34.0)
MCHC: 31.5 g/dL (ref 30.0–36.0)
MCV: 94.9 fL (ref 80.0–100.0)
Monocytes Absolute: 0.6 10*3/uL (ref 0.1–1.0)
Monocytes Relative: 3 %
Neutro Abs: 15.5 10*3/uL — ABNORMAL HIGH (ref 1.7–7.7)
Neutrophils Relative %: 93 %
Platelets: 292 10*3/uL (ref 150–400)
RBC: 4.31 MIL/uL (ref 4.22–5.81)
RDW: 14.5 % (ref 11.5–15.5)
WBC: 16.8 10*3/uL — ABNORMAL HIGH (ref 4.0–10.5)
nRBC: 0 % (ref 0.0–0.2)

## 2018-11-05 LAB — BASIC METABOLIC PANEL
Anion gap: 11 (ref 5–15)
BUN: 41 mg/dL — ABNORMAL HIGH (ref 8–23)
CO2: 32 mmol/L (ref 22–32)
Calcium: 8.4 mg/dL — ABNORMAL LOW (ref 8.9–10.3)
Chloride: 101 mmol/L (ref 98–111)
Creatinine, Ser: 1.01 mg/dL (ref 0.61–1.24)
GFR calc Af Amer: >60 mL/min (ref >60)
GFR calc non Af Amer: >60 mL/min (ref >60)
Glucose, Bld: 122 mg/dL — ABNORMAL HIGH (ref 70–99)
Potassium: 4.5 mmol/L (ref 3.5–5.1)
Sodium: 144 mmol/L (ref 135–145)

## 2018-11-05 LAB — MAGNESIUM: Magnesium: 2.4 mg/dL (ref 1.7–2.4)

## 2018-11-05 MED ORDER — SULFAMETHOXAZOLE-TRIMETHOPRIM 400-80 MG PO TABS
1.0000 | ORAL_TABLET | Freq: Every day | ORAL | Status: DC
  Administered 2018-11-06 – 2018-11-09 (×4): 1 via ORAL
  Filled 2018-11-05 (×4): qty 1

## 2018-11-05 NOTE — Progress Notes (Addendum)
Speech Therapy note: reviewed chart notes; consulted CCU MD re: pt's status. He reported pt is tolerating his oral diet w/ no overt oropharyngeal phase deficits noted. 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 Per MD notes, "pt and wife state after pt eats, he has frequent episodes of coughing, and he also endorses problems with acid reflux. Therefore, cefepime and doxycycline stopped and started unasyn for aspiration from such".  Pt continues to receive treatment for hypoxic respiratory failure this admission. He maintains tolerance for a dys. Level 2 (Minced foods moistened) diet w/ thin liquids per MD report w/ no oropharyngeal phase deficits. Dietician is following. General aspiration precautions are posted in room. Recommend Reflux precautions as per GI recs; frequent oral care. ST services will sign off w/ MD to reconsult if any changes in pt's oropharyngeal phase swallowing is noted. MD agreed.     Orinda Kenner, [place], CCC-SLP

## 2018-11-05 NOTE — Progress Notes (Signed)
[facility] - [place] at [facility]   PATIENT NAME: Kylo Omelia    MR#:  [id number]  DATE OF BIRTH:  Feb 03, 1946  SUBJECTIVE:   Patient weaned from high flow to 6 L nasal cannula.  No acute events overnight.  Patient feeling much better.   REVIEW OF SYSTEMS:  Review of Systems  Constitutional: Negative.  Negative for chills, fever and malaise/fatigue.  HENT: Negative.  Negative for ear discharge, ear pain, hearing loss, nosebleeds and sore throat.   Eyes: Negative.  Negative for blurred vision and pain.  Respiratory: Positive for shortness of breath (better). Negative for cough, hemoptysis and wheezing.   Cardiovascular: Negative.  Negative for chest pain, palpitations and leg swelling.  Gastrointestinal: Negative.  Negative for abdominal pain, blood in stool, diarrhea, nausea and vomiting.  Genitourinary: Negative.  Negative for dysuria.  Musculoskeletal: Negative.  Negative for back pain.  Skin: Negative.   Neurological: Negative for dizziness, tremors, speech change, focal weakness, seizures and headaches.  Endo/Heme/Allergies: Negative.  Does not bruise/bleed easily.  Psychiatric/Behavioral: Negative.  Negative for depression, hallucinations and suicidal ideas.   Tolerating Diet: yes  DRUG ALLERGIES:   Allergies  Allergen Reactions  . Flomax [Tamsulosin Hcl]     VITALS:  Blood pressure 111/67, pulse 72, temperature (!) 96.4 F (35.8 C), temperature source Axillary, resp. rate (!) 23, height 5\' 11"  (1.803 m), weight 74 kg, SpO2 93 %.  PHYSICAL EXAMINATION:   Constitutional: Appears well-developed  HENT: Normocephalic. Marland Kitchen Oropharynx is clear and moist.  Eyes: Conjunctivae and EOM are normal. PERRLA, no scleral icterus.  Neck: Normal ROM. Neck supple. No JVD. No tracheal deviation. CVS: RRR, S1/S2 +, no murmurs, no gallops, no carotid bruit.  Pulmonary: Patient without wheezing.  No rhonchi or rales noted.   Abdominal: Soft. BS +,  no distension,  tenderness, rebound or guarding.  Musculoskeletal: Normal range of motion. No edema and no tenderness.  Neuro: Alert. CN 2-12 grossly intact. No focal deficits. Skin: Skin is warm and dry. No rash noted. Psychiatric: Alert and oriented x3.  LABORATORY PANEL:   CBC Recent Labs  Lab 11/05/18 0610  WBC 16.8*  HGB 12.9*  HCT 40.9  PLT 292   ------------------------------------------------------------------------------------------------------------------  Chemistries  Recent Labs  Lab 11/05/18 0610  NA 144  K 4.5  CL 101  CO2 32  GLUCOSE 122*  BUN 41*  CREATININE 1.01  CALCIUM 8.4*  MG 2.4   ------------------------------------------------------------------------------------------------------------------  Cardiac Enzymes No results for input(s): TROPONINI in the last 168 hours. ------------------------------------------------------------------------------------------------------------------  RADIOLOGY:     ASSESSMENT AND PLAN:   73 year old male with PAF and CAD who presented after mechanical fall and suffered rib fracture.  1.  Acute respiratory failure, hypoxic: This was initially due to combination of atelectasis from rib fractures and concern for aspiration pneumonia.   He has completed course of unasyn Repeat covid 19 testing negative Wean nasal cannula as tolerated ??  Amiodarone toxicity Continue steroids as this seems to have improved patient's oxygen. As per my conversation with pulmonary, patient will need to be on long-term steroids. Continue Bactrim PCP prophylaxis due to steroids  2..  Rib fractures: Patient is being treated conservatively.  Rib fractures are status post mechanical fall.  CT head and CT C-spine were negative for acute fracture. Continue incentive spirometer.  3.  PAF with history of sustained V. 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  CAD status post stents: Continue aspirin, metoprolol, and Lipitor.  5.  Acute on chronic diastolic chf Patient has been diuresed  Currently on Lasix IV which can be changed to oral tomorrow  6.  COPD without signs of exacerbation: Continue inhalers Steroids are for concern of amiodarone toxicity/fibrosis/scarring Can be changed to oral steroids. 7.  Essential hypertension: Patient will continue on metoprolol     Outpatient palliative care services  d/w intensivist Management plans discussed with the patient and he is in agreement.  CODE STATUS: DNR/DNI  TOTAL TIME TAKING CARE OF THIS PATIENT: 32 minutes.     Hillary Bow M.D on 11/01/2018 at 12:22 PM  Between 7am to 6pm - Pager - [phone]  After 6pm go to [email or web address] - password [email or web address]  Office  [phone]  CC: Primary care physician; Baxter Hire, MD  Note: This dictation was prepared with Dragon dictation along with smaller phrase technology. Any transcriptional errors that result from this process are unintentional.

## 2018-11-05 NOTE — Progress Notes (Signed)
Pharmacy Electrolyte Monitoring Consult:  Pharmacy consulted to assist in monitoring and replacing electrolytes in this 73 y.o. male admitted on 10/22/2018 with hypoxemic respiratory failure and respiratory distress secondary to pneumonitis secondary to chronic silent aspiration. 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 History significant for atrial fibrillation, coronary artery disease s/p RCA stents, mitral valve repair, ventricular tachycardia, stroke, and kidney stones. Patient was on antibiotics when pneumonia was suspected but has finished antibiotic therapy. 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   Labs:  Sodium (mmol/L)  Date Value  11/05/2018 144   Potassium (mmol/L)  Date Value  11/05/2018 4.5   Magnesium (mg/dL)  Date Value  11/05/2018 2.4   Calcium (mg/dL)  Date Value  11/05/2018 8.4 (L)   Albumin (g/dL)  Date Value  10/22/2018 3.7     Assessment/Plan: Due to extensive cardiac issues, will replace for goal potassium ~ 4 and goal magnesium ~ 2. 8/29: K 4.5  Mag 2.4  Scr 1.01 Furosemide 40 mg IV BID continued. Magnesium oxide 400 mg PO BID also continued. Patient also starte don IV Bactrim for PCP prophylaxis  Electrolytes WNL and do not warrant replacement at this time.  Will obtain BMP with AM labs and magnesium as warranted. Pharmacy will continue to monitor and adjust per consult.   Jashawna Reever A 11/05/2018 11:30 AM

## 2018-11-05 NOTE — Progress Notes (Signed)
CRITICAL CARE PROGRESS NOTE    Name: Darius Miller MRN: [id number] DOB: [birth year]     LOS: 11   SUBJECTIVE FINDINGS & SIGNIFICANT EVENTS   Patient description:  Pt admitted 08/15 s/p fall resulting in a rib fracture transferred to ICU 08/19 with worsening hypoxemic respiratory failure and respiratory distress.  He was placed on BiPAP.  Chest x-ray reveals patchy bilateral infiltrates.  CT of chest from 8/18 reveals patchy, predominantly interstitial infiltrates in both lungs likely pneumonitis in the setting of chronic aspiration although there is concern this could be amiodarone induced   Lines / Drains: PIV  Cultures / Sepsis markers: Blood cx neg up to date , procalcitonin mildly elevated .6  Antibiotics: 10/22/2018: ED s/p fall. 10/23/2018: Continued to be febrile. Required O2 via [place]. CSW with recommendation to transfer to SNF - [facility] 10/25/2018: Plan was to discharge to [facility] today. He was hypoxic (60's on 2L oxygen). Started on high flow. 1 dose Lasix given. [facility] came to pick him up. Patient at that time was on 10 L high flow with SpO2 @ 86%. Kept in hospital.  BNP baseline 346.0. CXR completed --> stable cardiomegaly, bronchovascular crowding and vascular congestion, and bilateral calcified pleural plaques.  10/26/18: On High Flow - to 55L @ 100% FiO2. Started on Cefepime.  9 AM patient on HFNC 55 lpm @ 100% - with O2 saturation in the high 80's, and was lethargic. Patient admitted to ICU for further management. 10/28/18: . Pt and pts wife states after pt eats he has frequent episodes of coughing and he also endorses problems with acid reflux.  Therefore, cefepime and doxycycline stopped and started unasyn for aspiration  10/30/18: Tolerating 40L @ 60%    Overnight:  Patient  improved now on 6L/min    PAST MEDICAL HISTORY   Past Medical History:  Diagnosis Date  . Atrial fibrillation ([place])   . Coronary artery disease  s/p RCA stents   . History of mitral valve repair   . Hypertension   . 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 Kidney stones   . Stroke ([place])   . Ventricular tachycardia ([place])      SURGICAL HISTORY   Past Surgical History:  Procedure Laterality Date  . CARDIAC CATHETERIZATION    . CORONARY ANGIOPLASTY    . Coronary stent 2009  2009  . ICD IMPLANT    . MITRAL VALVE REPAIR  October 16, 2014     FAMILY HISTORY   History reviewed. No pertinent family history.   SOCIAL HISTORY   Social History   Tobacco Use  . Smoking status: Former Smoker    Packs/day: 2.00    Years: 20.00    Pack years: 40.00    Types: Cigarettes  . Smokeless tobacco: Never Used  Substance Use Topics  . Alcohol use: Not on file  . Drug use: Not on file     MEDICATIONS   Current Medication:  Current Facility-Administered Medications:  .  acetaminophen (TYLENOL) tablet 650 mg, 650 mg, Oral, Q6H PRN, 650 mg at 10/22/18 2100 **OR** acetaminophen (TYLENOL) suppository 650 mg, 650 mg, Rectal, Q6H PRN, Mayo, Pete Pelt, MD .  aspirin EC tablet 81 mg, 81 mg, Oral, Daily, Lu Duffel, RPH, 81 mg at 11/04/18 0951 .  atorvastatin (LIPITOR) tablet 20 mg, 20 mg, Oral, Daily, Mayo, Pete Pelt, MD, 20 mg at 11/04/18 0951 .  bacitracin ointment, , Topical, Daily, Mayo, Pete Pelt,  MD, 1 application at 11/04/18 0953 .  Chlorhexidine Gluconate Cloth 2 % PADS 6 each, 6 each, Topical, Daily, Vida RiggerAleskerov, Avaiyah Strubel, MD, 6 each at 11/04/18 [phone] .  dabigatran (PRADAXA) capsule 150 mg, 150 mg, Oral, BID, Mayo, Allyn KennerKaty Dodd, MD, 150 mg at 11/04/18 2105 .  docusate sodium (COLACE) capsule 100 mg, 100 mg, Oral, BID, Orlandria Kissner, MD, 100 mg at 11/04/18 2104 .  feeding supplement (ENSURE ENLIVE) (ENSURE ENLIVE) liquid 237 mL,  237 mL, Oral, BID BM, Sudini, Srikar, MD .  furosemide (LASIX) injection 40 mg, 40 mg, Intravenous, BID, Karna ChristmasAleskerov, Naudia Crosley, MD, 40 mg at 11/05/18 0945 .  ipratropium-albuterol (DUONEB) 0.5-2.5 (3) MG/3ML nebulizer solution 3 mL, 3 mL, Nebulization, Q4H PRN, Mansy, Jan A, MD .  magic mouthwash, 5 mL, Oral, QID, Dove, Tasha A, NP, 5 mL at 11/05/18 0949 .  magnesium oxide (MAG-OX) tablet 400 mg, 400 mg, Oral, BID, Bertram SavinSimpson, Michael L, RPH, 400 mg at 11/04/18 2104 .  methylPREDNISolone sodium succinate (SOLU-MEDROL) 40 mg/mL injection 40 mg, 40 mg, Intravenous, Q12H, Karna ChristmasAleskerov, Mehr Depaoli, MD, 40 mg at 11/05/18 0946 .  metoprolol succinate (TOPROL-XL) 24 hr tablet 12.5 mg, 12.5 mg, Oral, BID, Mayo, Allyn KennerKaty Dodd, MD, Stopped at 11/04/18 2106 .  mexiletine (MEXITIL) capsule 200 mg, 200 mg, Oral, Q12H, Mayo, Allyn KennerKaty Dodd, MD, 200 mg at 11/04/18 2105 .  morphine 2 MG/ML injection 2 mg, 2 mg, Intravenous, Q3H PRN, Harlon DittyKeene, Jeremiah D, NP, 2 mg at 11/05/18 0956 .  multivitamin with minerals tablet 1 tablet, 1 tablet, Oral, Daily, Sudini, Srikar, MD .  [DISCONTINUED] ondansetron (ZOFRAN) tablet 4 mg, 4 mg, Oral, Q6H PRN **OR** ondansetron (ZOFRAN) injection 4 mg, 4 mg, Intravenous, Q6H PRN, Mayo, Allyn KennerKaty Dodd, MD .  oxyCODONE (Oxy IR/ROXICODONE) immediate release tablet 5 mg, 5 mg, Oral, Q4H PRN, Mayo, Allyn KennerKaty Dodd, MD, 5 mg at 10/29/18 0130 .  phenol (CHLORASEPTIC) mouth spray 1 spray, 1 spray, Mouth/Throat, PRN, Mody, Sital, MD .  polyethylene glycol (MIRALAX / GLYCOLAX) packet 17 g, 17 g, Oral, Daily, Sudini, Srikar, MD, 17 g at 11/04/18 0952 .  potassium chloride SA (K-DUR) CR tablet 40 mEq, 40 mEq, Oral, Once, Ellington, Abby K, RPH .  sulfamethoxazole-trimethoprim (BACTRIM) 160 mg in dextrose 5 % 250 mL IVPB, 160 mg, Intravenous, Q12H, Davaughn Hillyard, MD, Last Rate: 260 mL/hr at 11/05/18 0942, 160 mg at 11/05/18 0942 .  venlafaxine (EFFEXOR) tablet 37.5 mg, 37.5 mg, Oral, BID WC, Mayo, Allyn KennerKaty Dodd, MD, 37.5 mg at 11/04/18 1722     ALLERGIES   Flomax [tamsulosin hcl]    REVIEW OF SYSTEMS    10 point ROS done and is positive for dyspnea at rest   PHYSICAL EXAMINATION   Vital Signs: Temp:  [96.4 F (35.8 C)-98.7 F (37.1 C)] 96.4 F (35.8 C) (08/29 0800) Pulse Rate:  [44-79] 79 (08/29 0934) Resp:  [15-28] 25 (08/29 0934) BP: (88-134)/(63-83) 102/83 (08/29 0800) SpO2:  [80 %-100 %] 93 % (08/29 0934)  GENERAL:mild distress due to dyspnea HEAD: Normocephalic, atraumatic.  EYES: Pupils equal, round, reactive to light.  No scleral icterus.  MOUTH: Moist mucosal membrane. NECK: Supple. No thyromegaly. No nodules. No JVD.  PULMONARY: bibasilar crackles CARDIOVASCULAR: S1 and S2. Regular rate and rhythm. No murmurs, rubs, or gallops.  GASTROINTESTINAL: Soft, nontender, non-distended. No masses. Positive bowel sounds. No hepatosplenomegaly.  MUSCULOSKELETAL: No swelling, clubbing, or edema.  NEUROLOGIC: Mild distress due to acute illness SKIN:intact,warm,dry   PERTINENT DATA     Infusions: . sulfamethoxazole-trimethoprim 160 mg (11/05/18  2119)   Scheduled Medications: . aspirin EC  81 mg Oral Daily  . atorvastatin  20 mg Oral Daily  . bacitracin   Topical Daily  . Chlorhexidine Gluconate Cloth  6 each Topical Daily  . dabigatran  150 mg Oral BID  . docusate sodium  100 mg Oral BID  . feeding supplement (ENSURE ENLIVE)  237 mL Oral BID BM  . furosemide  40 mg Intravenous BID  . magic mouthwash  5 mL Oral QID  . magnesium oxide  400 mg Oral BID  . methylPREDNISolone (SOLU-MEDROL) injection  40 mg Intravenous Q12H  . metoprolol succinate  12.5 mg Oral BID  . mexiletine  200 mg Oral Q12H  . multivitamin with minerals  1 tablet Oral Daily  . polyethylene glycol  17 g Oral Daily  . potassium chloride  40 mEq Oral Once  . venlafaxine  37.5 mg Oral BID WC   PRN Medications: acetaminophen **OR** acetaminophen, ipratropium-albuterol, morphine injection, [DISCONTINUED] ondansetron **OR**  ondansetron (ZOFRAN) IV, oxyCODONE, phenol Hemodynamic parameters:   Intake/Output: 08/28 0701 - 08/29 0700 In: 500 [P.O.:240; IV Piggyback:260] Out: 2175 [Urine:2175]  Ventilator  Settings:     Other Labs:     LAB RESULTS:  Basic Metabolic Panel: Recent Labs  Lab 10/30/18 0436 10/31/18 0355 11/01/18 0444 11/02/18 0351 11/03/18 0543 11/04/18 0706 11/05/18 0610  NA 146* 146* 143 142 143 145 144  K 3.5 3.5 3.6 4.3 4.0 4.5 4.5  CL 106 105 103 99 101 104 101  CO2 32 30 29 32 32 34* 32  GLUCOSE 120* 100* 108* 132* 152* 152* 122*  BUN 44* 27* 21 25* 38* 47* 41*  CREATININE 0.99 0.78 0.68 0.76 0.82 1.00 1.01  CALCIUM 8.9 8.8* 8.6* 8.8* 8.6* 8.5* 8.4*  MG 2.3 1.9 1.9  --   --   --  2.4   Liver Function Tests: No results for input(s): AST, ALT, ALKPHOS, BILITOT, PROT, ALBUMIN in the last 168 hours. No results for input(s): LIPASE, AMYLASE in the last 168 hours. No results for input(s): AMMONIA in the last 168 hours. CBC: Recent Labs  Lab 11/01/18 0444 11/02/18 0351 11/03/18 0543 11/04/18 0706 11/05/18 0610  WBC 14.4* 13.3* 18.7* 18.5* 16.8*  NEUTROABS 12.1*  --   --  17.2* 15.5*  HGB 12.2* 12.4* 13.2 12.6* 12.9*  HCT 38.0* 39.3 41.7 40.5 40.9  MCV 94.8 94.2 93.5 96.0 94.9  PLT 249 266 313 304 292   Cardiac Enzymes: No results for input(s): CKTOTAL, CKMB, CKMBINDEX, TROPONINI in the last 168 hours. BNP: Invalid input(s): POCBNP CBG: Recent Labs  Lab 11/04/18 0734  GLUCAP 126*     IMAGING RESULTS:  Imaging: No results found. @PROBHOSP @   ASSESSMENT AND PLAN    -Multidisciplinary rounds held today  Acute Hypoxic Respiratory Failure -procalcitonin mildly elevated  -patient on chronic amiodarone 400 bid  -s/p >5L diuresis improved  - possible CHF exacerbation as patient had improvement in BNP and clinically post diuresis -patient and wife share multiple aspiration events sometime  "3 times per month".    Acute on chronic systolic CHF  exacerbation -oxygen as needed -Lasix as tolerated -follow up cardiac enzymes as indicated ICU monitoring   ID -continue IV abx as prescibed -follow up cultures  GI/Nutrition GI PROPHYLAXIS as indicated DIET-->TF's as tolerated Constipation protocol as indicated  ENDO - ICU hypoglycemic\Hyperglycemia protocol -check FSBS per protocol   ELECTROLYTES -follow labs as needed -replace as needed -pharmacy consultation   DVT/GI PRX ordered -SCDs  TRANSFUSIONS AS NEEDED MONITOR FSBS ASSESS the need for LABS as needed   Critical care provider statement:    Critical care time (minutes):  36   Critical care time was exclusive of:  Separately billable procedures and treating other patients   Critical care was necessary to treat or prevent imminent or life-threatening deterioration of the following conditions:  Acute hypoxemic respiratory failure, acute CHF exacerbation , pulmonary pneumonitis, multiple comorbid conditions   Critical care was time spent personally by me on the following activities:  Development of treatment plan with patient or surrogate, discussions with consultants, evaluation of patient's response to treatment, examination of patient, obtaining history from patient or surrogate, ordering and performing treatments and interventions, ordering and review of laboratory studies and re-evaluation of patient's condition.  I assumed direction of critical care for this patient from another provider in my specialty: no    This document was prepared using Dragon voice recognition software and may include unintentional dictation errors.    Vida RiggerFuad Cletis Clack, M.D.  Division of Pulmonary & Critical Care Medicine  [facility]

## 2018-11-06 LAB — CBC
HCT: 42.2 % (ref 39.0–52.0)
Hemoglobin: 13.1 g/dL (ref 13.0–17.0)
MCH: 30.2 pg (ref 26.0–34.0)
MCHC: 31 g/dL (ref 30.0–36.0)
MCV: 97.2 fL (ref 80.0–100.0)
Platelets: 259 10*3/uL (ref 150–400)
RBC: 4.34 MIL/uL (ref 4.22–5.81)
RDW: 14.6 % (ref 11.5–15.5)
WBC: 14.5 10*3/uL — ABNORMAL HIGH (ref 4.0–10.5)
nRBC: 0 % (ref 0.0–0.2)

## 2018-11-06 LAB — BASIC METABOLIC PANEL
Anion gap: 13 (ref 5–15)
BUN: 38 mg/dL — ABNORMAL HIGH (ref 8–23)
CO2: 32 mmol/L (ref 22–32)
Calcium: 8.4 mg/dL — ABNORMAL LOW (ref 8.9–10.3)
Chloride: 98 mmol/L (ref 98–111)
Creatinine, Ser: 0.97 mg/dL (ref 0.61–1.24)
GFR calc Af Amer: >60 mL/min (ref >60)
GFR calc non Af Amer: >60 mL/min (ref >60)
Glucose, Bld: 140 mg/dL — ABNORMAL HIGH (ref 70–99)
Potassium: 4.8 mmol/L (ref 3.5–5.1)
Sodium: 143 mmol/L (ref 135–145)

## 2018-11-06 LAB — MAGNESIUM: Magnesium: 2.5 mg/dL — ABNORMAL HIGH (ref 1.7–2.4)

## 2018-11-06 MED ORDER — PREDNISONE 50 MG PO TABS
50.0000 mg | ORAL_TABLET | Freq: Every day | ORAL | Status: DC
  Administered 2018-11-06 – 2018-11-09 (×4): 50 mg via ORAL
  Filled 2018-11-06 (×4): qty 1

## 2018-11-06 MED ORDER — FUROSEMIDE 40 MG PO TABS
40.0000 mg | ORAL_TABLET | Freq: Every day | ORAL | Status: DC
  Administered 2018-11-06 – 2018-11-09 (×4): 40 mg via ORAL
  Filled 2018-11-06 (×4): qty 1

## 2018-11-06 NOTE — Progress Notes (Signed)
Pharmacy Electrolyte Monitoring Consult:  Pharmacy consulted to assist in monitoring and replacing electrolytes in this 73 y.o. male admitted on 10/22/2018 with hypoxemic respiratory failure and respiratory distress secondary to pneumonitis secondary to chronic silent aspiration. 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 History significant for atrial fibrillation, coronary artery disease s/p RCA stents, mitral valve repair, ventricular tachycardia, stroke, and kidney stones. Patient was on antibiotics when pneumonia was suspected but has finished antibiotic therapy. 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   Labs:  Sodium (mmol/L)  Date Value  11/06/2018 143   Potassium (mmol/L)  Date Value  11/06/2018 4.8   Magnesium (mg/dL)  Date Value  11/06/2018 2.5 (H)   Calcium (mg/dL)  Date Value  11/06/2018 8.4 (L)   Albumin (g/dL)  Date Value  10/22/2018 3.7     Assessment/Plan: Due to extensive cardiac issues, will replace for goal potassium ~ 4 and goal magnesium ~ 2.  8/30: K 4.8  Mag 2.5  Scr 0.97 Furosemide 40 mg PO daily now ordered. Magnesium oxide 400 mg PO BID also continued.  Patient also started on Bactrim for PCP prophylaxis d/t steriods  Electrolytes WNL and do not warrant replacement at this time.  Will obtain BMP with AM labs and magnesium as warranted. Pharmacy will continue to monitor and adjust per consult.   Marney Treloar A 11/06/2018 10:42 AM

## 2018-11-06 NOTE — Progress Notes (Signed)
Patient's bp low, held metoprolol this am per Dr Benjie Karvonen.

## 2018-11-06 NOTE — Progress Notes (Signed)
Patient continues to refuse MetaNeb throughout this shift.  I offered to start Flutter Valve (Acapella) for an alternative and he refused this device as well. Patient RN made aware of patient's continued refusal of therapies provided by respiratory.

## 2018-11-06 NOTE — Progress Notes (Signed)
[employer] - [place] at [facility]   PATIENT NAME: Darius Miller    MR#:  [id number]  DATE OF BIRTH:  1945-12-30  SUBJECTIVE:   Patient with improved shortness of breath.  Still on 6 L oxygen.  Feels much better than yesterday.  REVIEW OF SYSTEMS:  Review of Systems  Constitutional: Negative.  Negative for chills, fever and malaise/fatigue.  HENT: Negative.  Negative for ear discharge, ear pain, hearing loss, nosebleeds and sore throat.   Eyes: Negative.  Negative for blurred vision and pain.  Respiratory: Negative for cough, hemoptysis, shortness of breath (better) and wheezing.   Cardiovascular: Negative.  Negative for chest pain, palpitations and leg swelling.  Gastrointestinal: Negative.  Negative for abdominal pain, blood in stool, diarrhea, nausea and vomiting.  Genitourinary: Negative.  Negative for dysuria.  Musculoskeletal: Negative.  Negative for back pain.  Skin: Negative.   Neurological: Negative for dizziness, tremors, speech change, focal weakness, seizures and headaches.  Endo/Heme/Allergies: Negative.  Does not bruise/bleed easily.  Psychiatric/Behavioral: Negative.  Negative for depression, hallucinations and suicidal ideas.   Tolerating Diet: yes  DRUG ALLERGIES:   Allergies  Allergen Reactions  . Flomax [Tamsulosin Hcl]     VITALS:  Blood pressure (!) 90/55, pulse 79, temperature 98.2 F (36.8 C), temperature source Oral, resp. rate 20, height 5\' 11"  (1.803 m), weight 74 kg, SpO2 91 %.  PHYSICAL EXAMINATION:   Constitutional: Appears well-developed  HENT: Normocephalic. Marland Kitchen Oropharynx is clear and moist.  Eyes: Conjunctivae and EOM are normal. PERRLA, no scleral icterus.  Neck: Normal ROM. Neck supple. No JVD. No tracheal deviation. CVS: IRR, IRR S1/S2 +, no murmurs, no gallops, no carotid bruit.  Pulmonary: Patient without wheezing.  No rhonchi or rales noted.   Abdominal: Soft. BS +,  no distension, tenderness, rebound or guarding.   Musculoskeletal: Normal range of motion. No edema and no tenderness.  Neuro: Alert. CN 2-12 grossly intact. No focal deficits. Skin: Skin is warm and dry. No rash noted. Psychiatric: Alert and oriented x3.  LABORATORY PANEL:   CBC Recent Labs  Lab 11/06/18 0438  WBC 14.5*  HGB 13.1  HCT 42.2  PLT 259   ------------------------------------------------------------------------------------------------------------------  Chemistries  Recent Labs  Lab 11/06/18 0438  NA 143  K 4.8  CL 98  CO2 32  GLUCOSE 140*  BUN 38*  CREATININE 0.97  CALCIUM 8.4*  MG 2.5*   ------------------------------------------------------------------------------------------------------------------  Cardiac Enzymes No results for input(s): TROPONINI in the last 168 hours. ------------------------------------------------------------------------------------------------------------------  RADIOLOGY:     ASSESSMENT AND PLAN:   73 year old male with PAF and CAD who presented after mechanical fall and suffered rib fracture.  1.  Acute respiratory failure, hypoxic: This was initially due to combination of atelectasis from rib fractures and concern for aspiration pneumonia.   He has completed course of unasyn Repeat covid 19 testing negative Wean nasal cannula as tolerated ??  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 Continue Bactrim PCP prophylaxis due to steroids as per the request of pulmonary.  2..  Rib fractures: Patient is being treated conservatively.  Rib fractures are status post mechanical fall.  CT head and CT C-spine were negative for acute fracture. Continue incentive spirometer.  3.  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  CAD status post stents: Continue aspirin, metoprolol, and Lipitor.  5.  Acute on chronic diastolic chf Patient is now euvolemic.   Lasix changed to oral dose.    6.  COPD without signs of exacerbation: Continue inhalers Steroids are for concern of amiodarone toxicity/fibrosis/scarring Can be changed to oral steroids. 7.  Essential hypertension: Patient will continue on metoprolol (HELD THIS AM FOR LOW BP)     COVID testing ordered for an anticipation to discharge to skilled nursing facility tomorrow. Discussed with Education officer, museum.   Outpatient palliative care services  Discussed with DR A. This am   Management plans discussed with the patient and he is in agreement.  CODE STATUS: DNR/DNI  TOTAL TIME TAKING CARE OF THIS PATIENT: 28 minutes.     Hillary Bow M.D on 11/01/2018 at 10:28 AM  Between 7am to 6pm - Pager - [phone]  After 6pm go to [email or web address] - password [email or web address] [place] [facility]  Office  [phone]  CC: Primary care physician; Baxter Hire, MD  Note: This dictation was prepared with Dragon dictation along with smaller phrase technology. Any transcriptional errors that result from this process are unintentional.

## 2018-11-07 LAB — BASIC METABOLIC PANEL
Anion gap: 7 (ref 5–15)
BUN: 37 mg/dL — ABNORMAL HIGH (ref 8–23)
CO2: 36 mmol/L — ABNORMAL HIGH (ref 22–32)
Calcium: 8.2 mg/dL — ABNORMAL LOW (ref 8.9–10.3)
Chloride: 98 mmol/L (ref 98–111)
Creatinine, Ser: 1.04 mg/dL (ref 0.61–1.24)
GFR calc Af Amer: >60 mL/min (ref >60)
GFR calc non Af Amer: >60 mL/min (ref >60)
Glucose, Bld: 97 mg/dL (ref 70–99)
Potassium: 4.3 mmol/L (ref 3.5–5.1)
Sodium: 141 mmol/L (ref 135–145)

## 2018-11-07 LAB — NOVEL CORONAVIRUS, NAA (HOSP ORDER, SEND-OUT TO REF LAB; TAT 18-24 HRS): SARS-CoV-2, NAA: NOT DETECTED

## 2018-11-07 LAB — MAGNESIUM: Magnesium: 2.2 mg/dL (ref 1.7–2.4)

## 2018-11-07 MED ORDER — VENLAFAXINE HCL ER 75 MG PO CP24
75.0000 mg | ORAL_CAPSULE | Freq: Every day | ORAL | Status: DC
  Administered 2018-11-08 – 2018-11-09 (×2): 75 mg via ORAL
  Filled 2018-11-07 (×2): qty 1

## 2018-11-07 MED ORDER — FLUTICASONE FUROATE-VILANTEROL 100-25 MCG/INH IN AEPB
1.0000 | INHALATION_SPRAY | Freq: Every day | RESPIRATORY_TRACT | Status: DC
  Administered 2018-11-07 – 2018-11-09 (×3): 1 via RESPIRATORY_TRACT
  Filled 2018-11-07: qty 28

## 2018-11-07 MED ORDER — UMECLIDINIUM BROMIDE 62.5 MCG/INH IN AEPB
1.0000 | INHALATION_SPRAY | Freq: Every day | RESPIRATORY_TRACT | Status: DC
  Administered 2018-11-07 – 2018-11-09 (×3): 1 via RESPIRATORY_TRACT
  Filled 2018-11-07: qty 7

## 2018-11-07 NOTE — Progress Notes (Signed)
Occupational Therapy Treatment Patient Details Name: Darius Miller MRN: [id number] DOB: 1945/11/18 Today's Date: 11/07/2018    History of present illness Pt admitted 08/15 s/p fall resulting in rib fracture (2nd and 3rd)  transferred to ICU 08/19 due to decline in respiratory status,  He was placed on BiPAP.  Chest x-ray reveals patchy bilateral infiltrates. PMH of afib, HTN, ICD, kidney stones, stroke   OT comments  Pt. education was provided about energy conservation, work simplification techniques for BADLs, and IADLs, as well as pursed lip breathing. Pt. was provided with a HEP which was reviewed with the pt. Pt. SO2 96% HR 68 on 6LO2 via [place]. Pt. Continues to benefit from OT services for ADL training, A/E training, and pt. education about Energy conservation, and work simplification techniques home modification, and DME. Pt. Continues to benefit from SNF level of care upon discharge. Pt. could benefit from follow-up OT services at discharge.    Follow Up Recommendations  SNF SNF   Equipment Recommendations  3 in 1 bedside commode 3 in 1 commode   Recommendations for Other Services      Precautions / Restrictions Precautions Precautions: Fall Restrictions Weight Bearing Restrictions: No       Mobility Bed Mobility  Deferred   Transfers    Deferred                  Balance Overall balance assessment: Needs assistance Sitting-balance support: Feet unsupported Sitting balance-Leahy Scale: Fair                                     ADL either performed or assessed with clinical judgement   ADL Overall ADL's : Needs assistance/impaired Eating/Feeding: Set up;Sitting;Independent   Grooming: Set up;Supervision/safety;Bed level   Upper Body Bathing: Minimal assistance;Set up;Bed level   Lower Body Bathing: Moderate assistance;Bed level   Upper Body Dressing : Supervision/safety   Lower Body Dressing: Moderate assistance;Set up;Bed level                       Vision  Wears glasses for distance only     Perception     Praxis      Cognition Arousal/Alertness: Awake/alert Behavior During Therapy: WFL for tasks assessed/performed Overall Cognitive Status: No family/caregiver present to determine baseline cognitive functioning                                          Exercises   Shoulder Instructions       General Comments      Pertinent Vitals/ Pain       Pain Assessment: 6/10 Faces Pain Scale: Hurts whole lot Pain Location: back pain/rib pain with mobility Pain Descriptors / Indicators: Grimacing;Moaning;Constant;Guarding Pain Intervention(s): Limited activity within patient's tolerance;Monitored during session;Repositioned  Home Living                                          Prior Functioning/Environment              Frequency  Min 1X/week        Progress Toward Goals  OT Goals(current goals can now be found in the care plan section)  Acute Rehab OT Goals Patient Stated Goal: to go home OT Goal Formulation: With patient Time For Goal Achievement: 11/18/18 Potential to Achieve Goals: Good  Plan      Co-evaluation                 AM-PAC OT "6 Clicks" Daily Activity     Outcome Measure   Help from another person eating meals?: A Little Help from another person taking care of personal grooming?: A Little Help from another person toileting, which includes using toliet, bedpan, or urinal?: A Lot Help from another person bathing (including washing, rinsing, drying)?: A Lot Help from another person to put on and taking off regular upper body clothing?: A Little Help from another person to put on and taking off regular lower body clothing?: A Lot 6 Click Score: 15    End of Session   OT Visit Diagnosis: Other abnormalities of gait and mobility (R26.89);Repeated falls (R29.6);Muscle weakness (generalized) (M62.81)   Activity Tolerance  Patient tolerated treatment well;Patient limited by pain   Patient Left in bed;with call bell/phone within reach;with bed alarm set;Other (comment)   Nurse Communication          Time: 8502-7741 OT Time Calculation (min): 17 min  Charges: OT General Charges $OT Visit: 1 Visit OT Treatments $Self Care/Home Management : 8-22 mins  Olegario Messier, MS, OTR/L   Olegario Messier 11/07/2018, 2:25 PM

## 2018-11-07 NOTE — Plan of Care (Signed)
  Problem: Health Behavior/Discharge Planning: Goal: Ability to manage health-related needs will improve Outcome: Progressing   Problem: Clinical Measurements: Goal: Ability to maintain clinical measurements within normal limits will improve Outcome: Progressing Goal: Will remain free from infection Outcome: Progressing Goal: Diagnostic test results will improve Outcome: Progressing Goal: Respiratory complications will improve Outcome: Progressing Goal: Cardiovascular complication will be avoided Outcome: Progressing   Problem: Activity: Goal: Risk for activity intolerance will decrease Outcome: Progressing   Problem: Nutrition: Goal: Adequate nutrition will be maintained Outcome: Progressing   Problem: Elimination: Goal: Will not experience complications related to bowel motility Outcome: Progressing   Problem: Skin Integrity: Goal: Risk for impaired skin integrity will decrease Outcome: Progressing   

## 2018-11-07 NOTE — Progress Notes (Signed)
Physical Therapy Treatment Patient Details Name: Darius Miller MRN: [id number] DOB: 1945/07/06 Today's Date: 11/07/2018    History of Present Illness Pt admitted 08/15 s/p fall resulting in rib fracture (2nd and 3rd)  transferred to ICU 08/19 due to decline in respiratory status,  He was placed on BiPAP.  Chest x-ray reveals patchy bilateral infiltrates. PMH of afib, HTN, ICD, kidney stones, stroke    PT Comments    Patient alert, agreeable to PT, did endorse low back pain/rib pain at start of session. Pt performed several therapeutic exercises with verbal/tactile cues in bed. Supine to sit with modA, improved ability to perform with coaching through log rolling technique. Patient was able to sit EOB several minutes with fair balance, requested to return to supine due to increased pain. Returned to supine 2+ for safety and repositioned. spO2 monitored throughout, pt on 6L and >90%. The patient would benefit from further skilled PT intervention to continue to progress towards goals.      Follow Up Recommendations  SNF     Equipment Recommendations  Rolling walker with 5" wheels    Recommendations for Other Services OT consult     Precautions / Restrictions Precautions Precautions: Fall Restrictions Weight Bearing Restrictions: No    Mobility  Bed Mobility Overal bed mobility: Needs Assistance Bed Mobility: Sit to Supine;Supine to Sit     Supine to sit: HOB elevated;Mod assist Sit to supine: Mod assist;+2 for physical assistance   General bed mobility comments: Pt with significant pain with transfers this session, limited pt ability to mobilize  Transfers                    Ambulation/Gait                 Stairs             Wheelchair Mobility    Modified Rankin (Stroke Patients Only)       Balance Overall balance assessment: Needs assistance Sitting-balance support: Feet unsupported Sitting balance-Leahy Scale: Fair                                       Cognition Arousal/Alertness: Awake/alert Behavior During Therapy: WFL for tasks assessed/performed Overall Cognitive Status: No family/caregiver present to determine baseline cognitive functioning                                        Exercises General Exercises - Lower Extremity Ankle Circles/Pumps: AROM;Supine;Both;15 reps Heel Slides: AROM;Both;Strengthening;10 reps Straight Leg Raises: AROM;Strengthening;Both;10 reps    General Comments        Pertinent Vitals/Pain Pain Assessment: Faces Faces Pain Scale: Hurts whole lot Pain Location: back pain/rib pain with mobility Pain Descriptors / Indicators: Grimacing;Moaning;Constant;Guarding Pain Intervention(s): Limited activity within patient's tolerance;Monitored during session;Repositioned    Home Living                      Prior Function            PT Goals (current goals can now be found in the care plan section) Progress towards PT goals: Progressing toward goals(slowly)    Frequency    Min 2X/week      PT Plan Current plan remains appropriate    Co-evaluation  AM-PAC PT "6 Clicks" Mobility   Outcome Measure  Help needed turning from your back to your side while in a flat bed without using bedrails?: A Lot Help needed moving from lying on your back to sitting on the side of a flat bed without using bedrails?: A Lot Help needed moving to and from a bed to a chair (including a wheelchair)?: A Lot Help needed standing up from a chair using your arms (e.g., wheelchair or bedside chair)?: A Lot Help needed to walk in hospital room?: A Lot Help needed climbing 3-5 steps with a railing? : Total 6 Click Score: 11    End of Session Equipment Utilized During Treatment: Oxygen(6L) Activity Tolerance: Patient limited by pain Patient left: in bed;with bed alarm set;with call bell/phone within reach Nurse Communication: Mobility  status PT Visit Diagnosis: Muscle weakness (generalized) (M62.81);Repeated falls (R29.6);History of falling (Z91.81);Difficulty in walking, not elsewhere classified (R26.2);Pain Pain - Right/Left: Right     Time: 9292-4462 PT Time Calculation (min) (ACUTE ONLY): 23 min  Charges:  $Therapeutic Exercise: 23-37 mins                     Lieutenant Diego PT, DPT 2:05 PM,11/07/18 [phone]

## 2018-11-07 NOTE — Care Management Important Message (Signed)
Important Message  Patient Details  Name: Darius Miller MRN: [id number] Date of Birth: 30-Aug-1945   Medicare Important Message Given:  Yes     Juliann Pulse A Soledad Budreau 11/07/2018, 3:02 PM

## 2018-11-07 NOTE — Progress Notes (Signed)
[employer] - [place] at [facility]   PATIENT NAME: Darius Miller    MR#:  [id number]  DATE OF BIRTH:  08-14-1945  SUBJECTIVE:   Patient doing well this morning.  On 5 L of oxygen via nasal cannula. Awaiting insurance approval for discharge planning REVIEW OF SYSTEMS:  Review of Systems  Constitutional: Negative.  Negative for chills, fever and malaise/fatigue.  HENT: Negative.  Negative for ear discharge, ear pain, hearing loss, nosebleeds and sore throat.   Eyes: Negative.  Negative for blurred vision and pain.  Respiratory: Negative for cough, hemoptysis, shortness of breath (better) and wheezing.   Cardiovascular: Negative.  Negative for chest pain, palpitations and leg swelling.  Gastrointestinal: Negative.  Negative for abdominal pain, blood in stool, diarrhea, nausea and vomiting.  Genitourinary: Negative.  Negative for dysuria.  Musculoskeletal: Negative.  Negative for back pain.  Skin: Negative.   Neurological: Negative for dizziness, tremors, speech change, focal weakness, seizures and headaches.  Endo/Heme/Allergies: Negative.  Does not bruise/bleed easily.  Psychiatric/Behavioral: Negative.  Negative for depression, hallucinations and suicidal ideas.   Tolerating Diet: yes  DRUG ALLERGIES:   Allergies  Allergen Reactions  . Flomax [Tamsulosin Hcl]     VITALS:  Blood pressure 110/70, pulse 77, temperature 98.1 F (36.7 C), temperature source Oral, resp. rate 13, height 5\' 11"  (1.803 m), weight 74 kg, SpO2 94 %.  PHYSICAL EXAMINATION:   Constitutional: Appears well-developed  HENT: Normocephalic. Marland Kitchen Oropharynx is clear and moist.  Eyes: Conjunctivae and EOM are normal. PERRLA, no scleral icterus.  Neck: Normal ROM. Neck supple. No JVD. No tracheal deviation. CVS: IRR, IRR S1/S2 +, no murmurs, no gallops, no carotid bruit.  Pulmonary: Patient without wheezing.  No rhonchi or rales noted.   Abdominal: Soft. BS +,  no distension, tenderness, rebound  or guarding.  Musculoskeletal: Normal range of motion. No edema and no tenderness.  Neuro: Alert. CN 2-12 grossly intact. No focal deficits. Skin: Skin is warm and dry. No rash noted. Psychiatric: Alert and oriented x3.  LABORATORY PANEL:   CBC Recent Labs  Lab 11/06/18 0438  WBC 14.5*  HGB 13.1  HCT 42.2  PLT 259   ------------------------------------------------------------------------------------------------------------------  Chemistries  Recent Labs  Lab 11/07/18 0455  NA 141  K 4.3  CL 98  CO2 36*  GLUCOSE 97  BUN 37*  CREATININE 1.04  CALCIUM 8.2*  MG 2.2   ------------------------------------------------------------------------------------------------------------------  Cardiac Enzymes No results for input(s): TROPONINI in the last 168 hours. ------------------------------------------------------------------------------------------------------------------  RADIOLOGY:     ASSESSMENT AND PLAN:   73 year old male with PAF and CAD who presented after mechanical fall and suffered rib fracture.  1.  Acute respiratory failure, hypoxic: This was initially due to combination of atelectasis from rib fractures and concern for aspiration pneumonia.   He has completed course of unasyn Repeat covid 19 testing negative Wean nasal cannula as tolerated ??  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 Continue Bactrim PCP prophylaxis due to steroids as per the request of pulmonary.  2..  Rib fractures: Patient is being treated conservatively.  Rib fractures are status post mechanical fall.  CT head and CT C-spine were negative for acute fracture. Continue incentive spirometer.  3.  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  CAD status post stents: Continue aspirin, metoprolol, and Lipitor.  5.  Acute on chronic diastolic chf Patient is now euvolemic.   Continue oral Lasix  6.  COPD without signs of exacerbation: Continue inhalers Steroids are for concern of amiodarone toxicity/fibrosis/scarring   7.  Essential hypertension: Patient will continue on metoprolol    COVID testing negative. Awaiting approval for skilled nursing facility to [facility].  Outpatient palliative care services  Discussed with clinical social worker this morning and patient's wife.  Management plans discussed with the patient and he is in agreement.  CODE STATUS: DNR/DNI  TOTAL TIME TAKING CARE OF THIS PATIENT: 27 minutes.     Hillary Bow M.D on 11/01/2018 at 11:36 AM  Between 7am to 6pm - Pager - [phone]  After 6pm go to [email or web address] - password [email or web address] [facility] [facility]  Office  [phone]  CC: Primary care physician; Baxter Hire, MD  Note: This dictation was prepared with Dragon dictation along with smaller phrase technology. Any transcriptional errors that result from this process are unintentional.

## 2018-11-07 NOTE — Progress Notes (Signed)
Pharmacy Electrolyte Monitoring Consult:  Pharmacy consulted to assist in monitoring and replacing electrolytes in this 73 y.o. male admitted on 10/22/2018 with hypoxemic respiratory failure and respiratory distress secondary to pneumonitis secondary to chronic silent aspiration. 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 History significant for atrial fibrillation, coronary artery disease s/p RCA stents, mitral valve repair, ventricular tachycardia, stroke, and kidney stones. Patient was on antibiotics when pneumonia was suspected but has finished antibiotic therapy. 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   Labs:  Sodium (mmol/L)  Date Value  11/07/2018 141   Potassium (mmol/L)  Date Value  11/07/2018 4.3   Magnesium (mg/dL)  Date Value  11/07/2018 2.2   Calcium (mg/dL)  Date Value  11/07/2018 8.2 (L)   Albumin (g/dL)  Date Value  10/22/2018 3.7     Assessment/Plan: Due to extensive cardiac issues, will replace for goal potassium ~ 4 and goal magnesium ~ 2.  8/31: K 4.3  Mag 2.2  Scr 1.04 Furosemide 40 mg PO daily now ordered. Magnesium oxide 400 mg PO BID also continued. Bactrim for PCP prophylaxis d/t steriods  Electrolytes WNL and do not warrant replacement at this time. Electrolytes have been stable for several consecutive days.   Pharmacy will sign-off at this time.   [name] 11/07/2018 7:46 AM

## 2018-11-07 NOTE — TOC Progression Note (Signed)
Transition of Care [facility]) - Progression Note    Patient Details  Name: Darius Miller MRN: [id number] Date of Birth: 04/23/1945  Transition of Care [facility]) CM/SW Contact  Su Hilt, RN Phone Number: 11/07/2018, 10:07 AM  Clinical Narrative:     [facility] agrees to go [facility] as [facility] previously, I called Otila Kluver and requested to restart the insurance auth process.  She agreed and will let me know once obtained   Expected Discharge Plan: [place] Barriers to Discharge: No Barriers Identified  Expected Discharge Plan and Services Expected Discharge Plan: [place] In-house Referral: Clinical Social Work Discharge Planning Services: Other - See comment(skilled nursing facility) Post Acute Care Choice: [place] arrangements for the past 2 months: Single Family Home Expected Discharge Date: 10/25/18                                     Social Determinants of Health (SDOH) Interventions    Readmission Risk Interventions No flowsheet data found.

## 2018-11-08 NOTE — Progress Notes (Signed)
Physical Therapy Treatment Patient Details Name: Darius Miller MRN: [id number] DOB: 12-Jun-1945 Today's Date: 11/08/2018    History of Present Illness Pt admitted 08/15 s/p fall resulting in rib fracture (2nd and 3rd)  transferred to ICU 08/19 due to decline in respiratory status,  He was placed on BiPAP.  Chest x-ray reveals patchy bilateral infiltrates. PMH of afib, HTN, ICD, kidney stones, stroke    PT Comments    Patient easily woken at start of session, exhibited mod-significant pain signs/symptoms with mobility this session but improved compared to last session. Session focused on attempting to mobilize pt out of bed. Pt performed log rolling technique with verbal/tactile cues and step by step sequencing, modA to complete though better able to participate. Sit <> Stand with RW and minA able to stand ~10seconds with UE support prior to taking a few shuffled steps towards recliner. Pt up in chair, repositioned to maximize comfort and educated about being out of bed for at least 22mins-1hr today. Overall the patient is progressing towards goals slowly and would benefit from further skilled PT intervention.     Follow Up Recommendations  SNF     Equipment Recommendations  Rolling walker with 5" wheels    Recommendations for Other Services OT consult     Precautions / Restrictions Precautions Precautions: Fall Precaution Comments: rib fxs Restrictions Weight Bearing Restrictions: No    Mobility  Bed Mobility Overal bed mobility: Needs Assistance Bed Mobility: Sit to Supine;Supine to Sit     Supine to sit: HOB elevated;Mod assist     General bed mobility comments: improved ability to perform log rolling technique and participate in transfer  Transfers Overall transfer level: Needs assistance Equipment used: Rolling walker (2 wheeled) Transfers: Sit to/from Stand Sit to Stand: Min assist            Ambulation/Gait Ambulation/Gait assistance: Min guard Gait  Distance (Feet): 1 Feet Assistive device: Rolling walker (2 wheeled)       General Gait Details: Pt concerned about not being able to lift his feet due to increased pain, but able to take a few shuffled steps to recliner this session.   Stairs             Wheelchair Mobility    Modified Rankin (Stroke Patients Only)       Balance Overall balance assessment: Needs assistance Sitting-balance support: Feet supported Sitting balance-Leahy Scale: Fair                                      Cognition Arousal/Alertness: Awake/alert Behavior During Therapy: WFL for tasks assessed/performed Overall Cognitive Status: No family/caregiver present to determine baseline cognitive functioning                                        Exercises      General Comments        Pertinent Vitals/Pain Faces Pain Scale: Hurts even more Pain Location: with bed mobility and sit to stand Pain Descriptors / Indicators: Grimacing;Moaning;Constant;Guarding;Crying Pain Intervention(s): Limited activity within patient's tolerance;Monitored during session;Repositioned    Home Living                      Prior Function            PT Goals (current goals can  now be found in the care plan section) Progress towards PT goals: Progressing toward goals(slowly)    Frequency    Min 2X/week      PT Plan Current plan remains appropriate    Co-evaluation              AM-PAC PT "6 Clicks" Mobility   Outcome Measure  Help needed turning from your back to your side while in a flat bed without using bedrails?: A Lot Help needed moving from lying on your back to sitting on the side of a flat bed without using bedrails?: A Lot Help needed moving to and from a bed to a chair (including a wheelchair)?: A Lot Help needed standing up from a chair using your arms (e.g., wheelchair or bedside chair)?: A Lot Help needed to walk in hospital room?: A  Lot Help needed climbing 3-5 steps with a railing? : Total 6 Click Score: 11    End of Session Equipment Utilized During Treatment: Oxygen(6L) Activity Tolerance: Patient limited by fatigue;Patient limited by pain Patient left: with chair alarm set;in chair;with call bell/phone within reach Nurse Communication: Mobility status PT Visit Diagnosis: Muscle weakness (generalized) (M62.81);Repeated falls (R29.6);History of falling (Z91.81);Difficulty in walking, not elsewhere classified (R26.2);Pain Pain - Right/Left: Right Pain - part of body: (back, rib fx)     Time: 0923-3007 PT Time Calculation (min) (ACUTE ONLY): 20 min  Charges:  $Therapeutic Exercise: 8-22 mins                     Lieutenant Diego PT, DPT 12:14 PM,11/08/18 [phone]

## 2018-11-08 NOTE — Evaluation (Deleted)
Occupational Therapy Evaluation Patient Details Name: Darius Miller MRN: [id number] DOB: 1946-01-03 Today's Date: 11/08/2018    History of Present Illness Pt admitted 08/15 s/p fall resulting in rib fracture (2nd and 3rd)  transferred to ICU 08/19 due to decline in respiratory status,  He was placed on BiPAP.  Chest x-ray reveals patchy bilateral infiltrates. PMH of afib, HTN, ICD, kidney stones, stroke   Clinical Impression   Education was provided to the pt. and his wife about A/E use for LE ADLs, A/E use for energy conservation, pursed lip breathing, and work simplification techniques at home. A handout was provided to the pt.'s wife per her request. Pt. is on 5LO2 via [employer]. SO2 97%, HR 75 bpms. Pt.'s wife expressed concern about her ability to provide the amount of assist he may need upon returning home after rehab. Pt. Could benefit from OT services for ADL training, A/E training, and pt. Education about Energy conservation, and work simplification techniques, home modification, and DME. Pt. continues to be appropriate for SNF level of care upon discharge. Pt. could benefit from follow-up OT services at discharge.     Follow Up Recommendations  SNF    Equipment Recommendations    3 in 1 Commode   Recommendations for Other Services       Precautions / Restrictions Precautions Precautions: Fall Precaution Comments: rib fxs Restrictions Weight Bearing Restrictions: No      Mobility Bed Mobility       Deferred       Transfers  Deferred              Balance Overall balance assessment: Needs assistance Sitting-balance support: Feet supported Sitting balance-Leahy Scale: Fair Sitting balance - Comments: L lateral leaning                                   ADL either performed or assessed with clinical judgement   ADL Overall ADL's : Needs assistance/impaired Eating/Feeding: Set up;Sitting;Independent   Grooming: Set up;Supervision/safety;Bed  level   Upper Body Bathing: Minimal assistance;Set up;Bed level   Lower Body Bathing: Moderate assistance;Bed level   Upper Body Dressing : Minimal assistance;Bed level   Lower Body Dressing: Moderate assistance;Bed level                       Vision         Perception     Praxis      Pertinent Vitals/Pain Pain Assessment: 0-10 Faces Pain Scale: Hurts even more Pain Location: with bed mobility and sit to stand Pain Descriptors / Indicators: Grimacing;Moaning;Constant;Guarding;Crying Pain Intervention(s): Limited activity within patient's tolerance;Monitored during session;Repositioned     Hand Dominance Right   Extremity/Trunk Assessment Upper Extremity Assessment Upper Extremity Assessment: Generalized weakness           Communication Communication Communication: No difficulties   Cognition Arousal/Alertness: Awake/alert Behavior During Therapy: WFL for tasks assessed/performed Overall Cognitive Status: No family/caregiver present to determine baseline cognitive functioning                                     General Comments       Exercises     Shoulder Instructions      Home Living Family/patient expects to be discharged to:: Private residence Living Arrangements: Spouse/significant other Available Help at Discharge: Family;Available 24  hours/day Type of Home: House Home Access: Level entry     Home Layout: One level     Bathroom Shower/Tub: Teacher, early years/pre: Standard     Home Equipment: Cane - single point;Walker - 2 wheels          Prior Functioning/Environment Level of Independence: Independent                 OT Problem List: Decreased strength;Decreased coordination;Cardiopulmonary status limiting activity;Decreased range of motion;Decreased cognition;Decreased activity tolerance;Decreased safety awareness;Decreased knowledge of use of DME or AE;Impaired balance (sitting and/or  standing);Decreased knowledge of precautions      OT Treatment/Interventions: Self-care/ADL training;Therapeutic exercise;Therapeutic activities;Energy conservation;DME and/or AE instruction;Patient/family education;Balance training    OT Goals(Current goals can be found in the care plan section) Acute Rehab OT Goals Patient Stated Goal: To regain strength OT Goal Formulation: With patient Time For Goal Achievement: 11/18/18 Potential to Achieve Goals: Good  OT Frequency: Min 1X/week   Barriers to D/C:            Co-evaluation              AM-PAC OT "6 Clicks" Daily Activity     Outcome Measure Help from another person eating meals?: A Little Help from another person taking care of personal grooming?: A Little Help from another person toileting, which includes using toliet, bedpan, or urinal?: A Lot Help from another person bathing (including washing, rinsing, drying)?: A Lot Help from another person to put on and taking off regular upper body clothing?: A Little Help from another person to put on and taking off regular lower body clothing?: A Lot 6 Click Score: 15   End of Session    Activity Tolerance: Patient tolerated treatment well;Patient limited by pain Patient left: in bed;with call bell/phone within reach;with bed alarm set;Other (comment)  OT Visit Diagnosis: Other abnormalities of gait and mobility (R26.89);Repeated falls (R29.6);Muscle weakness (generalized) (M62.81)                Time: 9937-1696 OT Time Calculation (min): 23 min Charges:  OT General Charges $OT Visit: 1 Visit OT Treatments $Self Care/Home Management : 23-37 mins  Harrel Carina, MS, OTR/L  Harrel Carina 11/08/2018, 2:46 PM

## 2018-11-08 NOTE — Progress Notes (Signed)
[facility] - [place] [facility]   PATIENT NAME: Darius Miller    MR#:  [id number]  DATE OF BIRTH:  Feb 22, 1946  SUBJECTIVE:   Rib pain from fractures Doing ok otherwise ready to leave h ospital REVIEW OF SYSTEMS:  Review of Systems  Constitutional: Negative.  Negative for chills, fever and malaise/fatigue.  HENT: Negative.  Negative for ear discharge, ear pain, hearing loss, nosebleeds and sore throat.   Eyes: Negative.  Negative for blurred vision and pain.  Respiratory: Negative for cough, hemoptysis, shortness of breath (better) and wheezing.   Cardiovascular: Negative.  Negative for chest pain, palpitations and leg swelling.  Gastrointestinal: Negative.  Negative for abdominal pain, blood in stool, diarrhea, nausea and vomiting.  Genitourinary: Negative.  Negative for dysuria.  Musculoskeletal: Positive for falls and joint pain. Negative for back pain.  Skin: Negative.   Neurological: Negative for dizziness, tremors, speech change, focal weakness, seizures and headaches.  Endo/Heme/Allergies: Negative.  Does not bruise/bleed easily.  Psychiatric/Behavioral: Negative.  Negative for depression, hallucinations and suicidal ideas.   Tolerating Diet: yes  DRUG ALLERGIES:   Allergies  Allergen Reactions  . Flomax [Tamsulosin Hcl]     VITALS:  Blood pressure 112/71, pulse 71, temperature 98.4 F (36.9 C), temperature source Oral, resp. rate 18, height 5\' 11"  (1.803 m), weight 74 kg, SpO2 97 %.  PHYSICAL EXAMINATION:   Constitutional: Appears well-developed  HENT: Normocephalic. Marland Kitchen Oropharynx is clear and moist.  Eyes: Conjunctivae and EOM are normal. PERRLA, no scleral icterus.  Neck: Normal ROM. Neck supple. No JVD. No tracheal deviation. CVS: IRR, IRR S1/S2 +, no murmurs, no gallops, no carotid bruit.  Pulmonary: Patient without wheezing.  No rhonchi or rales noted.   Abdominal: Soft. BS +,  no distension, tenderness, rebound or guarding.   Musculoskeletal: Normal range of motion. No edema and no tenderness.  Neuro: Alert. CN 2-12 grossly intact. No focal deficits. Skin: Skin is warm and dry. No rash noted. Psychiatric: Alert and oriented x3.  LABORATORY PANEL:   CBC Recent Labs  Lab 11/06/18 0438  WBC 14.5*  HGB 13.1  HCT 42.2  PLT 259   ------------------------------------------------------------------------------------------------------------------  Chemistries  Recent Labs  Lab 11/07/18 0455  NA 141  K 4.3  CL 98  CO2 36*  GLUCOSE 97  BUN 37*  CREATININE 1.04  CALCIUM 8.2*  MG 2.2   ------------------------------------------------------------------------------------------------------------------  Cardiac Enzymes No results for input(s): TROPONINI in the last 168 hours. ------------------------------------------------------------------------------------------------------------------  RADIOLOGY:     ASSESSMENT AND PLAN:   73 year old male with PAF and CAD who presented after mechanical fall and suffered rib fracture.  1.  Acute respiratory failure, hypoxic: This was initially due to combination of atelectasis from rib fractures and concern for aspiration pneumonia.   He has completed course of unasyn Repeat covid 19 testing negative Wean nasal cannula as tolerated. 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 Continue Bactrim PCP prophylaxis due to steroids as per the request of pulmonary.  2..  Rib fractures: Patient is being treated conservatively.  Rib fractures are status post mechanical fall.  CT head and CT C-spine were negative for acute fracture. Continue incentive spirometer.  3.  PAF with history of sustained V. 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  CAD  status post stents: Continue aspirin, metoprolol, and Lipitor.  5.  Acute on chronic diastolic chf Patient is euvolemic.   Continue oral Lasix  6.  COPD without signs of exacerbation: Continue inhalers Steroids are for concern of amiodarone toxicity/fibrosis/scarring   7.  Essential hypertension: Patient will continue on metoprolol    COVID testing negative. Awaiting approval for skilled nursing facility [facility].  Outpatient palliative care services   Management plans discussed with the patient and he is in agreement.  CODE STATUS: DNR/DNI  TOTAL TIME TAKING CARE OF THIS PATIENT: 24 minutes.     Hillary Bow M.D on 11/01/2018 at 11:43 AM  Between 7am to 6pm - Pager - [phone]  After 6pm go to [email or web address] - password [email or web address] [facility]  Office  [phone]  CC: Primary care physician; Baxter Hire, MD  Note: This dictation was prepared with Dragon dictation along with smaller phrase technology. Any transcriptional errors that result from this process are unintentional.

## 2018-11-08 NOTE — Progress Notes (Signed)
Occupational Therapy Treatment Patient Details Name: Darius Miller MRN: [id number] DOB: 17-Apr-1945 Today's Date: 11/08/2018    History of present illness Pt admitted 08/15 s/p fall resulting in rib fracture (2nd and 3rd)  transferred to ICU 08/19 due to decline in respiratory status,  He was placed on BiPAP.  Chest x-ray reveals patchy bilateral infiltrates. PMH of afib, HTN, ICD, kidney stones, stroke   OT comments   Education was provided to the pt. and his wife about A/E use for LE ADLs, A/E use for energy conservation, pursed lip breathing, and work simplification techniques at home. A handout was provided to the pt.'s wife per her request. Pt. is on 5LO2 via [place]. SO2 97%, HR 75 bpms. Pt.'s wife expressed concern about her ability to provide the amount of assist he may need upon returning home after rehab. Pt. Could benefit from OT services for ADL training, A/E training, and pt. Education about Energy conservation, and work simplification techniques, home modification, and DME. Pt. continues to be appropriate for SNF level of care upon discharge. Pt. could benefit from follow-up OT services at discharge.        Follow Up Recommendations  SNF    Equipment Recommendations    3 in 1 Commode   Recommendations for Other Services      Precautions / Restrictions Precautions Precautions: Fall Precaution Comments: rib fxs Restrictions Weight Bearing Restrictions: No       Mobility Bed Mobility   Deferred      Mobility    Deferred    Balance                                 ADL either performed or assessed with clinical judgement   ADL Overall ADL's : Needs assistance/impaired Eating/Feeding: Set up;Sitting;Independent   Grooming: Set up;Supervision/safety;Bed level   Upper Body Bathing: Minimal assistance;Set up;Bed level   Lower Body Bathing: Moderate assistance;Bed level   Upper Body Dressing : Minimal assistance;Bed level   Lower Body Dressing:  Moderate assistance;Bed level                       Vision       Perception     Praxis      Cognition Arousal/Alertness: Awake/alert Behavior During Therapy: WFL for tasks assessed/performed Overall Cognitive Status: No family/caregiver present to determine baseline cognitive functioning                                          Exercises     Shoulder Instructions       General Comments      Pertinent Vitals/ Pain       Pain Assessment: 0-10 Faces Pain Scale: Hurts even more Pain Location: with bed mobility and sit to stand Pain Descriptors / Indicators: Grimacing;Moaning;Constant;Guarding;Crying Pain Intervention(s): Limited activity within patient's tolerance;Monitored during session;Repositioned  Home Living Family/patient expects to be discharged to:: Private residence Living Arrangements: Spouse/significant other Available Help at Discharge: Family;Available 24 hours/day Type of Home: House Home Access: Level entry     Home Layout: One level     Bathroom Shower/Tub: Chief Strategy Officer: Standard     Home Equipment: Cane - single point;Walker - 2 wheels          Prior Functioning/Environment Level of Independence:  Independent            Frequency  Min 1X/week        Progress Toward Goals  OT Goals(current goals can now be found in the care plan section)     Acute Rehab OT Goals Patient Stated Goal: To regain strength OT Goal Formulation: With patient Time For Goal Achievement: 11/18/18 Potential to Achieve Goals: Good  Plan      Co-evaluation                 AM-PAC OT "6 Clicks" Daily Activity     Outcome Measure   Help from another person eating meals?: A Little Help from another person taking care of personal grooming?: A Little Help from another person toileting, which includes using toliet, bedpan, or urinal?: A Lot Help from another person bathing (including washing, rinsing,  drying)?: A Lot Help from another person to put on and taking off regular upper body clothing?: A Little Help from another person to put on and taking off regular lower body clothing?: A Lot 6 Click Score: 15    End of Session    OT Visit Diagnosis: Other abnormalities of gait and mobility (R26.89);Repeated falls (R29.6);Muscle weakness (generalized) (M62.81)   Activity Tolerance Patient tolerated treatment well;Patient limited by pain   Patient Left in bed;with call bell/phone within reach;with bed alarm set;Other (comment)   Nurse Communication          Time: 0300-9233 OT Time Calculation (min): 23 min  Charges: OT General Charges $OT Visit: 1 Visit OT Treatments $Self Care/Home Management : 23-37 mins  Darius Carina, MS, OTR/L    Darius Miller 11/08/2018, 3:03 PM

## 2018-11-08 NOTE — TOC Progression Note (Signed)
Transition of Care [facility]) - Progression Note    Patient Details  Name: Darius Miller MRN: [id number] Date of Birth: 08/27/1945  Transition of Care [facility] CM/SW [place], RN Phone Number: 11/08/2018, 2:40 PM  Clinical Narrative:    Met with the patient's wife.  I explained that the patient has agreed to go to [facility] as previously planned and we are awaiting auth from insurance, she agreed and thanked me for the information   Expected Discharge Plan: [place] Barriers to Discharge: No Barriers Identified  Expected Discharge Plan and Services Expected Discharge Plan: [place] In-house Referral: Clinical Social Work Discharge Planning Services: Other - See comment(skilled nursing facility) Post Acute Care Choice: [place] arrangements for the past 2 months: Single Family Home Expected Discharge Date: 10/25/18                                     Social Determinants of Health (SDOH) Interventions    Readmission Risk Interventions No flowsheet data found.

## 2018-11-09 LAB — CBC
HCT: 40.4 % (ref 39.0–52.0)
Hemoglobin: 12.7 g/dL — ABNORMAL LOW (ref 13.0–17.0)
MCH: 29.7 pg (ref 26.0–34.0)
MCHC: 31.4 g/dL (ref 30.0–36.0)
MCV: 94.6 fL (ref 80.0–100.0)
Platelets: 237 10*3/uL (ref 150–400)
RBC: 4.27 MIL/uL (ref 4.22–5.81)
RDW: 14.4 % (ref 11.5–15.5)
WBC: 11.1 10*3/uL — ABNORMAL HIGH (ref 4.0–10.5)
nRBC: 0 % (ref 0.0–0.2)

## 2018-11-09 LAB — CREATININE, SERUM
Creatinine, Ser: 0.91 mg/dL (ref 0.61–1.24)
GFR calc Af Amer: >60 mL/min (ref >60)
GFR calc non Af Amer: >60 mL/min (ref >60)

## 2018-11-09 MED ORDER — PREDNISONE 50 MG PO TABS: ORAL_TABLET | ORAL | 0 refills | Status: DC

## 2018-11-09 MED ORDER — SULFAMETHOXAZOLE-TRIMETHOPRIM 400-80 MG PO TABS: 1.0000 | ORAL_TABLET | Freq: Every day | ORAL | 0 refills | Status: DC

## 2018-11-09 NOTE — Progress Notes (Signed)
[facility] NAME: Darius Miller    MR#:  [id number]  DATE OF BIRTH:  12/11/1945  SUBJECTIVE:   No acute events overnight. REVIEW OF SYSTEMS:  Review of Systems  Constitutional: Negative.  Negative for chills, fever and malaise/fatigue.  HENT: Negative.  Negative for ear discharge, ear pain, hearing loss, nosebleeds and sore throat.   Eyes: Negative.  Negative for blurred vision and pain.  Respiratory: Negative for cough, hemoptysis, shortness of breath (better) and wheezing.   Cardiovascular: Negative.  Negative for chest pain, palpitations and leg swelling.  Gastrointestinal: Negative.  Negative for abdominal pain, blood in stool, diarrhea, nausea and vomiting.  Genitourinary: Negative.  Negative for dysuria.  Musculoskeletal: Negative for back pain, falls and joint pain.  Skin: Negative.   Neurological: Negative for dizziness, tremors, speech change, focal weakness, seizures and headaches.  Endo/Heme/Allergies: Negative.  Does not bruise/bleed easily.  Psychiatric/Behavioral: Negative.  Negative for depression, hallucinations and suicidal ideas.   Tolerating Diet: yes  DRUG ALLERGIES:   Allergies  Allergen Reactions  . Flomax [Tamsulosin Hcl]     VITALS:  Blood pressure 122/78, pulse 70, temperature 98.6 F (37 C), resp. rate 18, height 5\' 11"  (1.803 m), weight 74 kg, SpO2 98 %.  PHYSICAL EXAMINATION:   Constitutional: Appears well-developed  HENT: Normocephalic. Marland Kitchen Oropharynx is clear and moist.  Eyes: Conjunctivae and EOM are normal. PERRLA, no scleral icterus.  Neck: Normal ROM. Neck supple. No JVD. No tracheal deviation. CVS: IRR, IRR S1/S2 +, no murmurs, no gallops, no carotid bruit.  Pulmonary: Patient without wheezing.  No rhonchi or rales noted.   Abdominal: Soft. BS +,  no distension, tenderness, rebound or guarding.  Musculoskeletal: Normal range of motion. No edema and no tenderness.  Neuro: Alert. CN 2-12  grossly intact. No focal deficits. Skin: Skin is warm and dry. No rash noted. Psychiatric: Alert and oriented x3.  LABORATORY PANEL:   CBC Recent Labs  Lab 11/09/18 0557  WBC 11.1*  HGB 12.7*  HCT 40.4  PLT 237   ------------------------------------------------------------------------------------------------------------------  Chemistries  Recent Labs  Lab 11/07/18 0455 11/09/18 0557  NA 141  --   K 4.3  --   CL 98  --   CO2 36*  --   GLUCOSE 97  --   BUN 37*  --   CREATININE 1.04 0.91  CALCIUM 8.2*  --   MG 2.2  --    ------------------------------------------------------------------------------------------------------------------  Cardiac Enzymes No results for input(s): TROPONINI in the last 168 hours. ------------------------------------------------------------------------------------------------------------------  RADIOLOGY:     ASSESSMENT AND PLAN:   73 year old male with PAF and CAD who presented after mechanical fall and suffered rib fracture.  1.  Acute respiratory failure, hypoxic: This was initially due to combination of atelectasis from rib fractures and concern for aspiration pneumonia.   He has completed course of unasyn Repeat covid 19 testing negative Wean nasal cannula as tolerated. 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 Continue Bactrim PCP prophylaxis due to steroids as per the request of pulmonary.  2..  Rib fractures: Patient is being treated conservatively.  Rib fractures are status post mechanical fall.  CT head and CT C-spine were negative for acute fracture. Continue incentive spirometer.  3.  PAF with history of sustained V. tach and ICD: Patient will continue on metoprolol, Pradaxa , MEXtil Amiodarone is  held due to concern of amiodarone induced pulmonary  toxicity  4.  CAD status post stents: Continue aspirin, metoprolol, and Lipitor.  5.  Acute on chronic diastolic chf Patient is euvolemic.   Continue oral Lasix  6.  COPD without signs of exacerbation: Continue inhalers Continue steroids for amiodarone toxicity/fibrosis/scarring   7.  Essential hypertension: Patient will continue on metoprolol    COVID testing negative.  Outpatient palliative care services   Awaiting insurance approval  Management plans discussed with the patient and he is in agreement.  CODE STATUS: DNR/DNI  TOTAL TIME TAKING CARE OF THIS PATIENT: 22 minutes.     Milagros Loll M.D on 11/01/2018 at 10:50 AM  Between 7am to 6pm - Pager - [phone]  After 6pm go to [email or web address] - password [email or web address]  [facility]  Office  [phone]  CC: Primary care physician; Gracelyn Nurse, MD  Note: This dictation was prepared with Dragon dictation along with smaller phrase technology. Any transcriptional errors that result from this process are unintentional.

## 2018-11-09 NOTE — TOC Transition Note (Signed)
Transition of Care [facility]) - CM/SW Discharge Note   Patient Details  Name: Darius Miller MRN: [id number] Date of Birth: Mar 07, 1946  Transition of Care [facility]) CM/SW Contact:  Su Hilt, RN Phone Number: 11/09/2018, 2:26 PM   Clinical Narrative:    [facility] via EMS transport  Bed side nurse to call report to [facility],  And call EMS for transport Darius Miller, the patient's spouse made aware of DC   Final next level of care: Skilled Nursing Facility Barriers to Discharge: Barriers Resolved   Patient Goals and CMS Choice Patient states their goals for this hospitalization and ongoing recovery are:: "My wife cannot handle me at home right now"      Discharge Placement                       Discharge Plan and Services In-house Referral: Clinical Social Work Discharge Planning Services: Other - See comment(skilled nursing facility) Post Acute Care Choice: Skilled Nursing Facility                               Social Determinants of Health (SDOH) Interventions     Readmission Risk Interventions No flowsheet data found.

## 2018-11-09 NOTE — Progress Notes (Signed)
Report called to Margreta Journey, Therapist, sports at [employer]. EMS called and awaiting for transport.

## 2018-11-09 NOTE — Progress Notes (Signed)
PT Cancellation Note  Patient Details Name: Darius Miller MRN: [id number] DOB: [birth year]   Cancelled Treatment:    Reason Eval/Treat Not Completed: Other (comment)(Pt requested politely for PT to let pt rest, PT agreeable and will re-attempt as able.)   Lieutenant Diego PT, DPT 12:15 PM,11/09/18 [phone]

## 2018-11-09 NOTE — Progress Notes (Signed)
EMS here to pick up patient. Wife called and notified that patient has left for SNF

## 2018-11-09 NOTE — Care Management Important Message (Signed)
Important Message  Patient Details  Name: Darius Miller MRN: [id number] Date of Birth: 11/08/1945   Medicare Important Message Given:  Yes     Juliann Pulse A Celestine Bougie 11/09/2018, 11:36 AM

## 2019-04-19 ENCOUNTER — Ambulatory Visit: Payer: Medicare HMO | Attending: Internal Medicine

## 2019-05-12 ENCOUNTER — Ambulatory Visit: Payer: Medicare HMO | Attending: Internal Medicine

## 2019-05-12 ENCOUNTER — Other Ambulatory Visit: Payer: Self-pay

## 2019-05-12 DIAGNOSIS — Z23 Encounter for immunization: Secondary | ICD-10-CM

## 2019-05-12 NOTE — Progress Notes (Signed)
   Covid-19 Vaccination Clinic  Name:  Jentry Warnell    MRN: [id number] DOB: 11-01-1945  05/12/2019  Mr. Silvera was observed post Covid-19 immunization for 15 minutes without incident. He was provided with Vaccine Information Sheet and instruction to access the V-Safe system.   Mr. Purtee was instructed to call 911 with any severe reactions post vaccine: Marland Kitchen Difficulty breathing  . Swelling of face and throat  . A fast heartbeat  . A bad rash all over body  . Dizziness and weakness

## 2019-06-03 ENCOUNTER — Ambulatory Visit: Payer: Medicare HMO

## 2019-06-06 ENCOUNTER — Ambulatory Visit: Payer: Medicare HMO | Attending: Internal Medicine

## 2019-06-06 DIAGNOSIS — Z23 Encounter for immunization: Secondary | ICD-10-CM

## 2019-06-06 NOTE — Progress Notes (Signed)
   Covid-19 Vaccination Clinic  Name:  Darius Miller    MRN: [id number] DOB: 1945-09-06  06/06/2019  [name] was observed post Covid-19 immunization for 15 minutes without incident. He was provided with Vaccine Information Sheet and instruction to access the V-Safe system.   [name] was instructed to call 911 with any severe reactions post vaccine: Marland Kitchen Difficulty breathing  . Swelling of face and throat  . A fast heartbeat  . A bad rash all over body  . Dizziness and weakness   Immunizations Administered    Name Date Dose VIS Date Route   Pfizer COVID-19 Vaccine 06/06/2019  2:43 PM 0.3 mL 02/17/2019 Intramuscular   Manufacturer: [employer], [employer]   Lot: [id number]   NDC: [address]-2

## 2019-06-07 ENCOUNTER — Ambulatory Visit: Payer: Medicare HMO

## 2019-10-14 ENCOUNTER — Emergency Department
Admission: EM | Admit: 2019-10-14 | Discharge: 2019-10-14 | Disposition: A | Discharge status: Home / Self Care | Payer: Medicare HMO | Attending: Emergency Medicine | Admitting: Emergency Medicine

## 2019-10-14 ENCOUNTER — Emergency Department: Payer: Medicare HMO

## 2019-10-14 ENCOUNTER — Other Ambulatory Visit: Payer: Self-pay

## 2019-10-14 DIAGNOSIS — I2510 Atherosclerotic heart disease of native coronary artery without angina pectoris: Secondary | ICD-10-CM | POA: Diagnosis not present

## 2019-10-14 DIAGNOSIS — Z79899 Other long term (current) drug therapy: Secondary | ICD-10-CM | POA: Diagnosis not present

## 2019-10-14 DIAGNOSIS — R0600 Dyspnea, unspecified: Secondary | ICD-10-CM | POA: Diagnosis not present

## 2019-10-14 DIAGNOSIS — J441 Chronic obstructive pulmonary disease with (acute) exacerbation: Secondary | ICD-10-CM | POA: Diagnosis not present

## 2019-10-14 DIAGNOSIS — Z87891 Personal history of nicotine dependence: Secondary | ICD-10-CM | POA: Insufficient documentation

## 2019-10-14 DIAGNOSIS — I4891 Unspecified atrial fibrillation: Secondary | ICD-10-CM | POA: Insufficient documentation

## 2019-10-14 DIAGNOSIS — I110 Hypertensive heart disease with heart failure: Secondary | ICD-10-CM | POA: Insufficient documentation

## 2019-10-14 DIAGNOSIS — Z7982 Long term (current) use of aspirin: Secondary | ICD-10-CM | POA: Insufficient documentation

## 2019-10-14 DIAGNOSIS — I5022 Chronic systolic (congestive) heart failure: Secondary | ICD-10-CM | POA: Insufficient documentation

## 2019-10-14 DIAGNOSIS — R0602 Shortness of breath: Secondary | ICD-10-CM | POA: Diagnosis present

## 2019-10-14 LAB — BASIC METABOLIC PANEL
Anion gap: 13 (ref 5–15)
BUN: 19 mg/dL (ref 8–23)
CO2: 29 mmol/L (ref 22–32)
Calcium: 9.6 mg/dL (ref 8.9–10.3)
Chloride: 103 mmol/L (ref 98–111)
Creatinine, Ser: 0.8 mg/dL (ref 0.61–1.24)
GFR calc Af Amer: >60 mL/min (ref >60)
GFR calc non Af Amer: >60 mL/min (ref >60)
Glucose, Bld: 123 mg/dL — ABNORMAL HIGH (ref 70–99)
Potassium: 3.4 mmol/L — ABNORMAL LOW (ref 3.5–5.1)
Sodium: 145 mmol/L (ref 135–145)

## 2019-10-14 LAB — CBC
HCT: 40.4 % (ref 39.0–52.0)
Hemoglobin: 12.9 g/dL — ABNORMAL LOW (ref 13.0–17.0)
MCH: 30.6 pg (ref 26.0–34.0)
MCHC: 31.9 g/dL (ref 30.0–36.0)
MCV: 95.7 fL (ref 80.0–100.0)
Platelets: 190 10*3/uL (ref 150–400)
RBC: 4.22 MIL/uL (ref 4.22–5.81)
RDW: 15.4 % (ref 11.5–15.5)
WBC: 7.6 10*3/uL (ref 4.0–10.5)
nRBC: 0 % (ref 0.0–0.2)

## 2019-10-14 LAB — TROPONIN I (HIGH SENSITIVITY)
Troponin I (High Sensitivity): 25 ng/L — ABNORMAL HIGH (ref <18)
Troponin I (High Sensitivity): 20 ng/L — ABNORMAL HIGH (ref <18)

## 2019-10-14 MED ORDER — PREDNISONE 10 MG PO TABS
10.0000 mg | ORAL_TABLET | Freq: Every day | ORAL | 0 refills | Status: DC
Start: 2019-10-14 — End: 2020-03-06

## 2019-10-14 MED ORDER — SODIUM CHLORIDE 0.9% FLUSH: 3.0000 mL | Freq: Once | INTRAVENOUS | Status: DC

## 2019-10-14 MED ORDER — DEXAMETHASONE SODIUM PHOSPHATE 10 MG/ML IJ SOLN
10.0000 mg | Freq: Once | INTRAMUSCULAR | Status: AC
  Administered 2019-10-14: 10 mg via INTRAMUSCULAR
  Filled 2019-10-14: qty 1

## 2019-10-14 MED ORDER — IPRATROPIUM-ALBUTEROL 0.5-2.5 (3) MG/3ML IN SOLN
3.0000 mL | Freq: Once | RESPIRATORY_TRACT | Status: AC
  Administered 2019-10-14: 3 mL via RESPIRATORY_TRACT
  Filled 2019-10-14: qty 9

## 2019-10-14 MED ORDER — IPRATROPIUM-ALBUTEROL 0.5-2.5 (3) MG/3ML IN SOLN
3.0000 mL | Freq: Once | RESPIRATORY_TRACT | Status: AC
  Administered 2019-10-14: 3 mL via RESPIRATORY_TRACT

## 2019-10-14 NOTE — ED Triage Notes (Signed)
PT to ED via POV from home c/o shortness of breath x1 week with a little bit of chest pain. Wife states that his cognitive ability has slowed down in the last week. PT reports o2 sats at home of 77%, room air sats upon arrival 96%. PT with hx of COPD.

## 2019-10-14 NOTE — ED Provider Notes (Addendum)
[facility] Emergency Department Provider Note  Time seen: 12:39 PM  I have reviewed the triage vital signs and the nursing notes.   HISTORY  Chief Complaint Shortness of Breath   HPI Darius Miller is a 74 y.o. male with a past medical history of CAD, hypertension, ICD, COPD presents to the emergency department for shortness of breath.  According to the patient last night he developed shortness of breath consistent with past COPD exacerbations.  Patient states he tried to go to sleep but due to the shortness of breath he decided to come to the emergency department.  After a prolonged wait in the emergency department waiting room he states he is now feeling better.  Patient was able to speak to his pulmonologist this morning and has a follow-up appointment on Tuesday.  Currently patient satting 96% on room air.  Speaking in full and complete sentences without distress or difficulty breathing.  Denies any chest pain, fever or cough.   Past Medical History:  Diagnosis Date  . Atrial fibrillation (HCC)   . Coronary artery disease  s/p RCA stents   . History of mitral valve repair   . Hypertension   . 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 Kidney stones   . Stroke (HCC)   . Ventricular tachycardia [facility])     Patient Active Problem List   Diagnosis Date Noted  . SOB (shortness of breath)   . Dizziness   . Goals of care, counseling/discussion   . Palliative care by specialist   . Pulmonary disease   . Rib fractures 10/25/2018  . Fever 10/22/2018  . Benign prostatic hyperplasia with lower urinary tract symptoms 10/13/2018  . Nephrolithiasis 10/13/2018  . Chronic systolic heart failure (HCC)   . V-tach (HCC) 01/18/2018  . Ventricular tachycardia (HCC) 06/03/2017  . VT (ventricular tachycardia) (HCC) 06/03/2017    Past Surgical History:  Procedure Laterality Date  . CARDIAC CATHETERIZATION    . CORONARY  ANGIOPLASTY    . Coronary stent 2009  2009  . ICD IMPLANT    . MITRAL VALVE REPAIR  October 16, 2014    Prior to Admission medications   Medication Sig Start Date End Date Taking? Authorizing Provider  acetaminophen (TYLENOL) 500 MG tablet Take 1 tablet by mouth every 8 (eight) hours as needed.    [provider]  aspirin 81 MG tablet Take 1 tablet (81 mg total) by mouth daily. 06/04/17   Tukov-Yual, Alroy Bailiff, NP  atorvastatin (LIPITOR) 20 MG tablet Take 20 mg by mouth daily. 12/03/17   [provider]  dabigatran (PRADAXA) 150 MG CAPS capsule Take 1 capsule (150 mg total) by mouth 2 (two) times daily. 06/04/17   Tukov-Yual, Alroy Bailiff, NP  furosemide (LASIX) 40 MG tablet Take 1 tablet (40 mg total) by mouth daily. 06/04/17   Tukov-Yual, Alroy Bailiff, NP  magnesium oxide (MAG-OX) 400 MG tablet Take 1 tablet by mouth 2 (two) times daily. 11/08/17   [provider]  metoprolol succinate (TOPROL-XL) 25 MG 24 hr tablet Take 0.5 tablets (12.5 mg total) by mouth 2 (two) times daily. 01/22/18   Adrian Saran, MD  mexiletine (MEXITIL) 200 MG capsule Take 1 capsule (200 mg total) by mouth every 12 (twelve) hours. 01/22/18   Adrian Saran, MD  nitroGLYCERIN (NITROSTAT) 0.4 MG SL tablet Place 1 tablet (0.4 mg total) under the tongue every 5 (five) minutes as needed for chest pain. 06/04/17   Tukov-Yual,  Magdalene S, NP  oxyCODONE (OXY IR/ROXICODONE) 5 MG immediate release tablet Take 1 tablet (5 mg total) by mouth every 4 (four) hours as needed for moderate pain. 10/23/18   Adrian Saran, MD  predniSONE (DELTASONE) 50 MG tablet Take 1 tablet daily 11/09/18   Adrian Saran, MD  sulfamethoxazole-trimethoprim (BACTRIM) 400-80 MG tablet Take 1 tablet by mouth daily. 11/09/18   Adrian Saran, MD  TRELEGY ELLIPTA 100-62.5-25 MCG/INH AEPB INHALE 1 PUFF INTO THE LUNGS ONCE DAILY 09/01/18   [provider]  venlafaxine XR (EFFEXOR-XR) 75 MG 24 hr capsule Take 75 mg by mouth daily.  09/23/18   [provider]    Allergies  Allergen Reactions  . Flomax [Tamsulosin Hcl]     No family history on file.  Social History Social History   Tobacco Use  . Smoking status: Former Smoker    Packs/day: 2.00    Years: 20.00    Pack years: 40.00    Types: Cigarettes  . Smokeless tobacco: Never Used  Substance Use Topics  . Alcohol use: Not Currently    Alcohol/week: 0.0 standard drinks  . Drug use: Never    Review of Systems Constitutional: Negative for fever. Cardiovascular: Negative for chest pain. Respiratory: Positive shortness of breath although improved currently. Gastrointestinal: Negative for abdominal pain, vomiting Musculoskeletal: Negative for musculoskeletal complaints Neurological: Negative for headache All other ROS negative  ____________________________________________   PHYSICAL EXAM:  VITAL SIGNS: ED Triage Vitals  Enc Vitals Group     BP 10/14/19 0151 (!) 158/118     Pulse Rate 10/14/19 0151 96     Resp 10/14/19 0151 20     Temp 10/14/19 0153 98.3 F (36.8 C)     Temp Source 10/14/19 0741 Oral     SpO2 10/14/19 0151 96 %     Weight 10/14/19 0152 162 lb (73.5 kg)     Height 10/14/19 0152 5\' 11"  (1.803 m)     Head Circumference --      [facility] Flow --      Pain Score 10/14/19 0152 0     Pain Loc --      Pain Edu? --      Excl. in GC? --    Constitutional: Alert and oriented. Well appearing and in no distress. Eyes: Normal exam ENT      Head: Normocephalic and atraumatic.      Mouth/Throat: Mucous membranes are moist. Cardiovascular: Normal rate, regular rhythm.  Respiratory: Normal respiratory effort without tachypnea nor retractions. Breath sounds are clear  Gastrointestinal: Soft and nontender. No distention. Musculoskeletal: Nontender with normal range of motion in all extremities.  Neurologic:  Normal speech and language. No gross focal neurologic deficits  Skin:  Skin is warm, dry and intact.  Psychiatric: Mood and affect are  normal.  ____________________________________________    EKG  EKG viewed and interpreted by myself shows what appears to be a sinus rhythm at 87 bpm with a narrow QRS, normal axis, largely normal intervals occasional PVC.  Nonspecific ST changes ST elevation.  ____________________________________________    RADIOLOGY  Chest x-ray shows pulmonary vascular congestion.  Mild bilateral pleural effusions.  ____________________________________________   INITIAL IMPRESSION / ASSESSMENT AND PLAN / ED COURSE  Pertinent labs & imaging results that were available during my care of the patient were reviewed by me and considered in my medical decision making (see chart for details).   Patient presents to the emergency department for shortness of breath.  Differential would include pneumonia,  ACS, COPD exacerbation, pulmonary edema.  Patient's labs are largely within normal limits including troponin unchanged x2.  Chest x-ray shows mild vascular congestion but no pulmonary edema.  Patient currently says he is feeling much better, satting 96% on room air throughout my evaluation.  We will dose IM Decadron and 3 duo nebs and reassess.  Anticipate likely discharge home with pulmonology follow-up on Tuesday as scheduled.  Patient agreeable to plan of care.  Patient continues to feel well.  Currently satting 94-96% on room air.  We will discharge the patient from the emergency department having follow-up with pulmonology on Tuesday as scheduled.  Patient agreeable to plan.  Loralyn Freshwater was evaluated in Emergency Department on 10/14/2019 for the symptoms described in the history of present illness. He was evaluated in the context of the global COVID-19 pandemic, which necessitated consideration that the patient might be at risk for infection with the SARS-CoV-2 virus that causes COVID-19. Institutional protocols and algorithms that pertain to the evaluation of patients at risk for COVID-19 are in a state of  rapid change based on information released by regulatory bodies including the CDC and federal and state organizations. These policies and algorithms were followed during the patient's care in the ED.  ____________________________________________   FINAL CLINICAL IMPRESSION(S) / ED DIAGNOSES  Dyspnea COPD exacerbation   Minna Antis, MD 10/14/19 1332    Minna Antis, MD 10/14/19 1335

## 2019-10-14 NOTE — ED Notes (Signed)
See triage note, pt reports shob  For "a long time" that has worsened over the past week. Speaking in complete sentences. RA SpO2 96%. Hx copd.  Alert and oriented. NAD noted

## 2019-10-15 NOTE — Progress Notes (Signed)
10/16/2019 11:26 AM   Darius Miller 10-Jun-1945 [id number]  Referring provider: Gracelyn Nurse, MD [address] [place],  [place] [address] No chief complaint on file.   HPI: Darius Miller is a 74 y.o. male with a history of BPH with mild LUTS, nephrolithiasis, and microhematuria returns today for a annual follow up.   -Prior history of microhematuria.   -He was followed by urology in [place] and underwent cystoscopy in 2016 which showed a small median lobe of the prostate and no bladder mucosal abnormalities.  CT in 2016 did show bilateral renal calculi. -He underwent an open pyelolithotomy in 2011 for a 3-4 cm left renal calculus after failed PCNL.  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 -Intermittent episodes total gross painless hematuria a few months ago which persisted for 2 weeks -PVR today is 29 mL.   PMH: Past Medical History:  Diagnosis Date  . Atrial fibrillation (HCC)   . Coronary artery disease  s/p RCA stents   . History of mitral valve repair   . Hypertension   . 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 Kidney stones   . Stroke (HCC)   . Ventricular tachycardia [facility])     Surgical History: Past Surgical History:  Procedure Laterality Date  . CARDIAC CATHETERIZATION    . CORONARY ANGIOPLASTY    . Coronary stent 2009  2009  . ICD IMPLANT    . MITRAL VALVE REPAIR  October 16, 2014    Home Medications:  Allergies as of 10/16/2019      Reactions   Flomax [tamsulosin Hcl]       Medication List       Accurate as of October 16, 2019 11:26 AM. If you have any questions, ask your nurse or doctor.        acetaminophen 500 MG tablet Commonly known as: TYLENOL Take 1 tablet by mouth every 8 (eight) hours as needed.   aspirin 81 MG tablet Take 1 tablet (81 mg total) by mouth daily.   atorvastatin 20 MG tablet Commonly known as: LIPITOR Take 20 mg by mouth daily.     [employer] D3 50 MCG (2000 UT) Caps Generic drug: Cholecalciferol Take 1 capsule by mouth daily.   dabigatran 150 MG Caps capsule Commonly known as: PRADAXA Take 1 capsule (150 mg total) by mouth 2 (two) times daily.   furosemide 40 MG tablet Commonly known as: LASIX Take 1 tablet (40 mg total) by mouth daily.   magnesium oxide 400 MG tablet Commonly known as: MAG-OX Take 1 tablet by mouth 2 (two) times daily.   metoprolol succinate 25 MG 24 hr tablet Commonly known as: TOPROL-XL Take 0.5 tablets (12.5 mg total) by mouth 2 (two) times daily.   mexiletine 200 MG capsule Commonly known as: MEXITIL Take 1 capsule (200 mg total) by mouth every 12 (twelve) hours.   nitroGLYCERIN 0.4 MG SL tablet Commonly known as: NITROSTAT Place 1 tablet (0.4 mg total) under the tongue every 5 (five) minutes as needed for chest pain.   oxyCODONE 5 MG immediate release tablet Commonly known as: Oxy IR/ROXICODONE Take 1 tablet (5 mg total) by mouth every 4 (four) hours as needed for moderate pain.   potassium chloride 10 MEQ tablet Commonly known as: KLOR-CON Take 10 mEq by mouth daily.   predniSONE 10 MG tablet Commonly known as: DELTASONE Take 1 tablet (10 mg total) by mouth daily. Day 1-3: take  4 tablets PO daily Day 4-6: take 3 tablets PO daily Day 7-9: take 2 tablets PO daily Day 10-12: take 1 tablet PO daily   sulfamethoxazole-trimethoprim 400-80 MG tablet Commonly known as: BACTRIM Take 1 tablet by mouth daily.   Trelegy Ellipta 100-62.5-25 MCG/INH Aepb Generic drug: Fluticasone-Umeclidin-Vilant INHALE 1 PUFF INTO THE LUNGS ONCE DAILY   venlafaxine XR 75 MG 24 hr capsule Commonly known as: EFFEXOR-XR Take 75 mg by mouth daily.       Allergies:  Allergies  Allergen Reactions  . Flomax [Tamsulosin Hcl]     Family History: History reviewed. No pertinent family history.  Social History:  reports that he has quit smoking. His smoking use included cigarettes. He has a 40.00  pack-year smoking history. He has never used smokeless tobacco. He reports previous alcohol use. He reports that he does not use drugs.   Physical Exam: BP (!) 134/95   Pulse 88   Ht 5\' 11"  (1.803 m)   Wt 167 lb 12.8 oz (76.1 kg)   BMI 23.40 kg/m   Constitutional:  Alert and oriented, No acute distress. HEENT: [place] AT, moist mucus membranes.  Trachea midline, no masses. Cardiovascular: No clubbing, cyanosis, or edema. Respiratory: Normal respiratory effort, no increased work of breathing. Skin: No rashes, bruises or suspicious lesions. Neurologic: Grossly intact, no focal deficits, moving all 4 extremities. Psychiatric: Normal mood and affect.  Laboratory Data:  Lab Results  Component Value Date   CREATININE 0.80 10/14/2019    Urinalysis Dipstick 3+ blood/2+ protein Microscopy >30 RBC/6-10 WBC  Pertinent Imaging: Results for orders placed or performed in visit on 10/16/19  Bladder Scan (Post Void Residual) in office  Result Value Ref Range   Scan Result 29      Assessment & Plan:   1.  Gross hematuria  UA today with >30 RBCs  Last hematuria evaluation was 2016  AUA hematuria risk stratification: High  We discussed the recommended evaluation of high risk hematuria to include CT urogram and cystoscopy.  The procedures were discussed and all questions were answered  2. History of nephrolithiasis No recent episodes  3. BPH with mild LUTS No bothersome symptoms PVR is 29 mL.   [facility] [address] [place], [place] [id number]  I, [phone], am acting as a scribe for Dr. Theador Hawthorne C. Tremon Sainvil,  I have reviewed the above documentation for accuracy and completeness, and I agree with the above.   Lorin Picket, MD

## 2019-10-16 ENCOUNTER — Other Ambulatory Visit: Payer: Self-pay

## 2019-10-16 ENCOUNTER — Ambulatory Visit: Payer: Medicare HMO | Admitting: Urology

## 2019-10-16 ENCOUNTER — Encounter: Payer: Self-pay | Admitting: Urology

## 2019-10-16 VITALS — BP 134/95 | HR 88 | Ht 71.0 in | Wt 167.8 lb

## 2019-10-16 DIAGNOSIS — N401 Enlarged prostate with lower urinary tract symptoms: Secondary | ICD-10-CM

## 2019-10-16 DIAGNOSIS — R310 Gross hematuria: Secondary | ICD-10-CM | POA: Diagnosis not present

## 2019-10-16 LAB — URINALYSIS, COMPLETE
Bilirubin, UA: NEGATIVE
Glucose, UA: NEGATIVE
Ketones, UA: NEGATIVE
Leukocytes,UA: NEGATIVE
Nitrite, UA: NEGATIVE
Specific Gravity, UA: 1.025 (ref 1.005–1.030)
Urobilinogen, Ur: 0.2 mg/dL (ref 0.2–1.0)
pH, UA: 5.5 (ref 5.0–7.5)

## 2019-10-16 LAB — MICROSCOPIC EXAMINATION
Bacteria, UA: NONE SEEN
RBC, Urine: >30 /hpf — AB (ref 0–2)

## 2019-10-16 LAB — BLADDER SCAN AMB NON-IMAGING: Scan Result: 29

## 2019-10-25 ENCOUNTER — Ambulatory Visit
Admission: RE | Admit: 2019-10-25 | Discharge: 2019-10-25 | Disposition: A | Discharge status: Home / Self Care | Payer: Medicare HMO | Source: Ambulatory Visit | Attending: Urology | Admitting: Urology

## 2019-10-25 ENCOUNTER — Other Ambulatory Visit: Payer: Self-pay

## 2019-10-25 DIAGNOSIS — R310 Gross hematuria: Secondary | ICD-10-CM | POA: Insufficient documentation

## 2019-10-25 MED ORDER — IOHEXOL 300 MG/ML  SOLN
125.0000 mL | Freq: Once | INTRAMUSCULAR | Status: AC | PRN
  Administered 2019-10-25: 125 mL via INTRAVENOUS

## 2019-11-22 NOTE — Progress Notes (Signed)
11/23/2019 CC:  Chief Complaint  Patient presents with  . Cysto    HPI: Darius Miller is a 74 y.o. male who returns for a cystoscopy.   -Prior history of microhematuria.  -He was followed by urology in [place] and underwent cystoscopy in 2016 which showed a small median lobe of the prostate and no bladder mucosal abnormalities. CT in 2016 did show bilateral renal calculi. -He underwent an open pyelolithotomy in 2011 for a 3-4 cm left renal calculus after failed PCNL.  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 -Intermittent episodes total gross painless hematuria a few months ago which persisted for 2 weeks -CTU from 10/25/2019 noted bilateral nephrolithiasis. No evidence of ureteral calculi, hydronephrosis, or other acute findings. No radiographic evidence of urinary tract neoplasm. Stable mildly enlarged prostate, and findings of chronic bladder outlet obstruction.  -Stable moderate hiatal hernia. Colonic diverticulosis. No radiographic evidence of diverticulitis. Stable small right inguinal hernia containing only fat.    Blood pressure 127/82, pulse 74, height 5\' 11"  (1.803 m), weight 161 lb (73 kg). NED. A&Ox3.   No respiratory distress   Abd soft, NT, ND Normal phallus with bilateral descended testicles  Cystoscopy Procedure Note  Patient identification was confirmed, informed consent was obtained, and patient was prepped using Betadine solution.  Lidocaine jelly was administered per urethral meatus.     Pre-Procedure: - Inspection reveals a normal caliber urethral meatus.  Procedure: The flexible cystoscope was introduced without difficulty - No urethral strictures/lesions are present. - Moderately enlarged prostate with hypervascularity.  - Mild elevation bladder neck - Bilateral ureteral orifices identified - Bladder mucosa  reveals no ulcers, tumors, or lesions - No bladder stones - No trabeculation   Retroflexion shows small  intravesical median lobe   Post-Procedure: - Patient tolerated the procedure well  Pertinent image: CLINICAL DATA:  Painless gross hematuria.  Nephrolithiasis.  EXAM: CT ABDOMEN AND PELVIS WITHOUT AND WITH CONTRAST  TECHNIQUE: Multidetector CT imaging of the abdomen and pelvis was performed following the standard protocol before and following the bolus administration of intravenous contrast.  CONTRAST:  OMNIPAQUE IOHEXOL 300 MG/ML  SOLN  COMPARISON:  08/16/2018  FINDINGS: Lower chest: No acute findings. Stable cardiomegaly. Stable bilateral calcified pleural plaque, consistent with asbestos related pleural disease.  Hepatobiliary: No mass visualized on this unenhanced exam. Gallbladder is unremarkable. No evidence of biliary ductal dilatation.  Pancreas: No mass or inflammatory process visualized on this unenhanced exam.  Spleen:  Within normal limits in size.  Adrenals/Urinary tract: Bilateral renal calculi are again seen measuring up to 13 mm. A 10 mm calculus is seen in the left renal pelvis. No evidence of ureteral calculi or hydronephrosis. Small renal cysts are again seen bilaterally, however there is no evidence of renal masses. No masses are seen involving the ureters or urinary bladder. Diffuse bladder wall thickening is seen, likely due to chronic bladder outlet obstruction given enlarged prostate.  Stomach/Bowel: Moderate hiatal hernia is again seen. No evidence of obstruction, inflammatory process, or abnormal fluid collections. Diverticulosis is seen mainly involving the sigmoid colon, however there is no evidence of diverticulitis.  Vascular/Lymphatic: No pathologically enlarged lymph nodes identified. Aortic atherosclerosis noted. No evidence of abdominal aortic aneurysm.  Reproductive:  Mildly enlarged prostate, without significant change.  Other:  Stable small right inguinal hernia containing only fat.  Musculoskeletal:  No  suspicious bone lesions identified.  IMPRESSION: Bilateral nephrolithiasis. No evidence of ureteral calculi, hydronephrosis, or other acute findings.  No  radiographic evidence of urinary tract neoplasm.  Stable mildly enlarged prostate, and findings of chronic bladder outlet obstruction.  Stable moderate hiatal hernia.  Colonic diverticulosis. No radiographic evidence of diverticulitis.  Stable small right inguinal hernia containing only fat.  Aortic Atherosclerosis (ICD10-I70.0).   Electronically Signed   By: Danae Orleans M.D.   On: 10/25/2019 20:42  I have personally reviewed the images and agree with radiologist interpretation.  The right renal calculus is peripherally located with an area of scar and would not recommend treatment.  The left renal calculus is not located in the renal pelvis but within an infundibulum.     Assessment/ Plan:  1. Gross hematuria -Previous hematuria evaluation was performed in 2016. -Most likely secondary to renal calculi/anticoagulation.  -NED. Cystoscopy remarkable for BPH.   2. Nephrolithiasis   Bilateral nephrolithiasis noted on recent CTU. The left renal calculus is not within the renal pelvis however within a lower pole infundibulum  We discussed various treatment options for urolithiasis including observation without medical expulsive therapy, shockwave lithotripsy (SWL), ureteroscopy and laser lithotripsy with stent placement, and percutaneous nephrolithotomy.  We discussed that management is based on stone size, location, density, patient co-morbidities, and patient preference.   SWL has a lower stone free rate in a single procedure, but also a lower complication rate compared to ureteroscopy and avoids a stent and associated stent related symptoms. Possible complications include renal hematoma, steinstrasse, and need for additional treatment.  Ureteroscopy with laser lithotripsy and stent placement has a higher stone free  rate than SWL in a single procedure, however increased complication rate including possible infection, ureteral injury, bleeding, and stent related morbidity. Common stent related symptoms include dysuria, urgency/frequency, and flank pain.  PCNL is the favored treatment for stones >2cm. It involves a small incision in the flank, with complete fragmentation of stones and removal. It has the highest stone free rate, but also the highest complication rate. Possible complications include bleeding, infection/sepsis, injury to surrounding organs including the pleura, and collecting system injury.   After an extensive discussion of the risks and benefits of the above treatment options, the patient would like to think over these options.   Georges Mouse, am acting as a scribe for Dr. Lorin Picket C. Ziya Coonrod,  I have reviewed the above documentation for accuracy and completeness, and I agree with the above.   Riki Altes, MD

## 2019-11-23 ENCOUNTER — Other Ambulatory Visit: Payer: Self-pay

## 2019-11-23 ENCOUNTER — Encounter: Payer: Self-pay | Admitting: Urology

## 2019-11-23 ENCOUNTER — Ambulatory Visit (INDEPENDENT_AMBULATORY_CARE_PROVIDER_SITE_OTHER): Payer: Medicare HMO | Admitting: Urology

## 2019-11-23 VITALS — BP 127/82 | HR 74 | Ht 71.0 in | Wt 161.0 lb

## 2019-11-23 DIAGNOSIS — R310 Gross hematuria: Secondary | ICD-10-CM | POA: Diagnosis not present

## 2019-11-24 ENCOUNTER — Encounter: Payer: Self-pay | Admitting: Urology

## 2019-11-24 LAB — URINALYSIS, COMPLETE
Bilirubin, UA: NEGATIVE
Glucose, UA: NEGATIVE
Nitrite, UA: NEGATIVE
Specific Gravity, UA: 1.015 (ref 1.005–1.030)
Urobilinogen, Ur: >8 mg/dL — ABNORMAL HIGH (ref 0.2–1.0)
pH, UA: 6.5 (ref 5.0–7.5)

## 2019-11-24 LAB — MICROSCOPIC EXAMINATION
Bacteria, UA: NONE SEEN
RBC, Urine: >30 /hpf — AB (ref 0–2)

## 2020-01-18 ENCOUNTER — Other Ambulatory Visit: Payer: Self-pay | Admitting: Internal Medicine

## 2020-01-18 DIAGNOSIS — R200 Anesthesia of skin: Secondary | ICD-10-CM

## 2020-01-31 ENCOUNTER — Other Ambulatory Visit (HOSPITAL_COMMUNITY): Payer: Self-pay | Admitting: Internal Medicine

## 2020-01-31 DIAGNOSIS — R200 Anesthesia of skin: Secondary | ICD-10-CM

## 2020-03-06 ENCOUNTER — Encounter: Payer: Self-pay | Admitting: Emergency Medicine

## 2020-03-06 ENCOUNTER — Encounter
Admission: EM | Disposition: A | Discharge status: Home / Self Care | Payer: Self-pay | Source: Home / Self Care | Attending: Emergency Medicine

## 2020-03-06 ENCOUNTER — Other Ambulatory Visit: Payer: Self-pay

## 2020-03-06 ENCOUNTER — Emergency Department: Payer: Medicare HMO

## 2020-03-06 ENCOUNTER — Observation Stay
Admission: EM | Admit: 2020-03-06 | Discharge: 2020-03-07 | Disposition: A | Discharge status: Home / Self Care | Payer: Medicare HMO | Attending: Hospitalist | Admitting: Hospitalist

## 2020-03-06 DIAGNOSIS — Z87891 Personal history of nicotine dependence: Secondary | ICD-10-CM | POA: Insufficient documentation

## 2020-03-06 DIAGNOSIS — Z7982 Long term (current) use of aspirin: Secondary | ICD-10-CM | POA: Diagnosis not present

## 2020-03-06 DIAGNOSIS — J449 Chronic obstructive pulmonary disease, unspecified: Secondary | ICD-10-CM | POA: Insufficient documentation

## 2020-03-06 DIAGNOSIS — I4891 Unspecified atrial fibrillation: Secondary | ICD-10-CM | POA: Insufficient documentation

## 2020-03-06 DIAGNOSIS — I471 Supraventricular tachycardia: Secondary | ICD-10-CM | POA: Diagnosis not present

## 2020-03-06 DIAGNOSIS — R55 Syncope and collapse: Secondary | ICD-10-CM

## 2020-03-06 DIAGNOSIS — I469 Cardiac arrest, cause unspecified: Secondary | ICD-10-CM | POA: Diagnosis not present

## 2020-03-06 DIAGNOSIS — I5022 Chronic systolic (congestive) heart failure: Secondary | ICD-10-CM | POA: Diagnosis not present

## 2020-03-06 DIAGNOSIS — I219 Acute myocardial infarction, unspecified: Secondary | ICD-10-CM | POA: Diagnosis not present

## 2020-03-06 DIAGNOSIS — I110 Hypertensive heart disease with heart failure: Secondary | ICD-10-CM | POA: Insufficient documentation

## 2020-03-06 DIAGNOSIS — R079 Chest pain, unspecified: Secondary | ICD-10-CM | POA: Diagnosis present

## 2020-03-06 DIAGNOSIS — I2510 Atherosclerotic heart disease of native coronary artery without angina pectoris: Secondary | ICD-10-CM | POA: Diagnosis not present

## 2020-03-06 DIAGNOSIS — Z9289 Personal history of other medical treatment: Secondary | ICD-10-CM

## 2020-03-06 DIAGNOSIS — R0781 Pleurodynia: Secondary | ICD-10-CM

## 2020-03-06 DIAGNOSIS — Z79899 Other long term (current) drug therapy: Secondary | ICD-10-CM | POA: Diagnosis not present

## 2020-03-06 DIAGNOSIS — I259 Chronic ischemic heart disease, unspecified: Secondary | ICD-10-CM

## 2020-03-06 DIAGNOSIS — R0789 Other chest pain: Secondary | ICD-10-CM

## 2020-03-06 LAB — CBC WITH DIFFERENTIAL/PLATELET
Abs Immature Granulocytes: 0.02 10*3/uL (ref 0.00–0.07)
Basophils Absolute: 0.1 10*3/uL (ref 0.0–0.1)
Basophils Relative: 1 %
Eosinophils Absolute: 0.1 10*3/uL (ref 0.0–0.5)
Eosinophils Relative: 2 %
HCT: 39.5 % (ref 39.0–52.0)
Hemoglobin: 12.5 g/dL — ABNORMAL LOW (ref 13.0–17.0)
Immature Granulocytes: 0 %
Lymphocytes Relative: 26 %
Lymphs Abs: 1.7 10*3/uL (ref 0.7–4.0)
MCH: 30 pg (ref 26.0–34.0)
MCHC: 31.6 g/dL (ref 30.0–36.0)
MCV: 94.7 fL (ref 80.0–100.0)
Monocytes Absolute: 0.6 10*3/uL (ref 0.1–1.0)
Monocytes Relative: 9 %
Neutro Abs: 4.1 10*3/uL (ref 1.7–7.7)
Neutrophils Relative %: 62 %
Platelets: 182 10*3/uL (ref 150–400)
RBC: 4.17 MIL/uL — ABNORMAL LOW (ref 4.22–5.81)
RDW: 14.7 % (ref 11.5–15.5)
WBC: 6.6 10*3/uL (ref 4.0–10.5)
nRBC: 0 % (ref 0.0–0.2)

## 2020-03-06 LAB — COMPREHENSIVE METABOLIC PANEL
ALT: 9 U/L (ref 0–44)
AST: 16 U/L (ref 15–41)
Albumin: 3.6 g/dL (ref 3.5–5.0)
Alkaline Phosphatase: 82 U/L (ref 38–126)
Anion gap: 7 (ref 5–15)
BUN: 11 mg/dL (ref 8–23)
CO2: 31 mmol/L (ref 22–32)
Calcium: 9.3 mg/dL (ref 8.9–10.3)
Chloride: 103 mmol/L (ref 98–111)
Creatinine, Ser: 0.91 mg/dL (ref 0.61–1.24)
GFR, Estimated: >60 mL/min (ref >60)
Glucose, Bld: 134 mg/dL — ABNORMAL HIGH (ref 70–99)
Potassium: 3.8 mmol/L (ref 3.5–5.1)
Sodium: 141 mmol/L (ref 135–145)
Total Bilirubin: 0.8 mg/dL (ref 0.3–1.2)
Total Protein: 6.4 g/dL — ABNORMAL LOW (ref 6.5–8.1)

## 2020-03-06 LAB — TROPONIN I (HIGH SENSITIVITY)
Troponin I (High Sensitivity): 17 ng/L (ref <18)
Troponin I (High Sensitivity): 34 ng/L — ABNORMAL HIGH (ref <18)

## 2020-03-06 SURGERY — CORONARY/GRAFT ACUTE MI REVASCULARIZATION
Anesthesia: Moderate Sedation

## 2020-03-06 MED ORDER — POTASSIUM CHLORIDE CRYS ER 20 MEQ PO TBCR
40.0000 meq | EXTENDED_RELEASE_TABLET | Freq: Two times a day (BID) | ORAL | Status: DC
  Administered 2020-03-07: 10:00:00 40 meq via ORAL
  Filled 2020-03-06: qty 2

## 2020-03-06 MED ORDER — VENLAFAXINE HCL ER 75 MG PO CP24
75.0000 mg | ORAL_CAPSULE | Freq: Every day | ORAL | Status: DC
  Administered 2020-03-07: 10:00:00 75 mg via ORAL
  Filled 2020-03-06: qty 1

## 2020-03-06 MED ORDER — ASPIRIN EC 81 MG PO TBEC
81.0000 mg | DELAYED_RELEASE_TABLET | Freq: Every day | ORAL | Status: DC
  Administered 2020-03-07: 10:00:00 81 mg via ORAL
  Filled 2020-03-06: qty 1

## 2020-03-06 MED ORDER — ACETAMINOPHEN 650 MG RE SUPP: 650.0000 mg | Freq: Four times a day (QID) | RECTAL | Status: DC | PRN

## 2020-03-06 MED ORDER — ONDANSETRON HCL 4 MG/2ML IJ SOLN: 4.0000 mg | Freq: Four times a day (QID) | INTRAMUSCULAR | Status: DC | PRN

## 2020-03-06 MED ORDER — METOPROLOL SUCCINATE ER 25 MG PO TB24
12.5000 mg | ORAL_TABLET | Freq: Two times a day (BID) | ORAL | Status: DC
  Administered 2020-03-06: 12.5 mg via ORAL
  Filled 2020-03-06 (×2): qty 0.5

## 2020-03-06 MED ORDER — POTASSIUM CHLORIDE ER 20 MEQ PO TBCR: 40.0000 meq | EXTENDED_RELEASE_TABLET | Freq: Every day | ORAL | Status: DC

## 2020-03-06 MED ORDER — ONDANSETRON HCL 4 MG PO TABS: 4.0000 mg | ORAL_TABLET | Freq: Four times a day (QID) | ORAL | Status: DC | PRN

## 2020-03-06 MED ORDER — FLUTICASONE-UMECLIDIN-VILANT 100-62.5-25 MCG/INH IN AEPB: 1.0000 | INHALATION_SPRAY | Freq: Every day | RESPIRATORY_TRACT | Status: DC | PRN

## 2020-03-06 MED ORDER — ACETAMINOPHEN 325 MG PO TABS
650.0000 mg | ORAL_TABLET | Freq: Four times a day (QID) | ORAL | Status: DC | PRN
  Administered 2020-03-06: 650 mg via ORAL
  Filled 2020-03-06: qty 2

## 2020-03-06 MED ORDER — MEXILETINE HCL 200 MG PO CAPS
200.0000 mg | ORAL_CAPSULE | Freq: Two times a day (BID) | ORAL | Status: DC
  Administered 2020-03-06: 200 mg via ORAL
  Filled 2020-03-06 (×3): qty 1

## 2020-03-06 MED ORDER — FLUDROCORTISONE ACETATE 0.1 MG PO TABS
100.0000 ug | ORAL_TABLET | Freq: Every day | ORAL | Status: DC
  Administered 2020-03-07: 10:00:00 100 ug via ORAL
  Filled 2020-03-06: qty 1

## 2020-03-06 MED ORDER — FUROSEMIDE 40 MG PO TABS
20.0000 mg | ORAL_TABLET | Freq: Every day | ORAL | Status: DC
  Administered 2020-03-07: 10:00:00 20 mg via ORAL
  Filled 2020-03-06: qty 1

## 2020-03-06 MED ORDER — DABIGATRAN ETEXILATE MESYLATE 150 MG PO CAPS
150.0000 mg | ORAL_CAPSULE | Freq: Two times a day (BID) | ORAL | Status: DC
  Administered 2020-03-06 – 2020-03-07 (×2): 150 mg via ORAL
  Filled 2020-03-06 (×3): qty 1

## 2020-03-06 MED ORDER — NITROGLYCERIN 0.4 MG SL SUBL
0.4000 mg | SUBLINGUAL_TABLET | SUBLINGUAL | Status: DC | PRN
  Administered 2020-03-06: 0.4 mg via SUBLINGUAL
  Filled 2020-03-06 (×2): qty 1

## 2020-03-06 MED ORDER — ATORVASTATIN CALCIUM 20 MG PO TABS
20.0000 mg | ORAL_TABLET | Freq: Every day | ORAL | Status: DC
  Administered 2020-03-07: 10:00:00 20 mg via ORAL
  Filled 2020-03-06: qty 1

## 2020-03-06 MED ORDER — VITAMIN B-12 1000 MCG PO TABS
1000.0000 ug | ORAL_TABLET | Freq: Every day | ORAL | Status: DC
  Administered 2020-03-07: 10:00:00 1000 ug via ORAL
  Filled 2020-03-06: qty 1

## 2020-03-06 NOTE — ED Notes (Signed)
Pt had bowel movement and incontinence episode prior to arrival. Sam RN cleaned pt, provided clean brief and chux at this time. Pt given warm blanket and moved up in bed for comfort.

## 2020-03-06 NOTE — ED Notes (Signed)
Per cardiology, cancel Code STEMI at this time

## 2020-03-06 NOTE — Progress Notes (Addendum)
  Called for possible code STEMI.  Patient with remote history of coronary artery disease, with pacemaker-defibrillator, followed by Dr. Sandy Salaam.  Patient was in his usual state of health until earlier today, while walking his dog, became lightheaded.  On arrival, fire department personnel unable to detect pulse, patient received brief CPR.  Patient brought to [facility] ED via EMS, ECG reveals sinus rhythm with baseline right bundle branch block, with intermittent ventricular pacing without ST elevation.  The patient denies chest pain, appears clinically and hemodynamically stable.  No ECG criteria for STEMI.  Code STEMI canceled.  Recommend hospitalization for further evaluation and management, with appropriate cardiology consultation.  The patient may require cardiac catheterization during this hospitalization.

## 2020-03-06 NOTE — ED Provider Notes (Signed)
[facility] Emergency Department Provider Note   ____________________________________________   Event Date/Time   First MD Initiated Contact with Patient 03/06/20 1440     (approximate)  I have reviewed the triage vital signs and the nursing notes.   HISTORY  Chief Complaint Code STEMI    HPI Darius Miller is a 74 y.o. male who went out to walk his dog and developed chest pain.  He called his girlfriend to come get him because he did not know if he could make it back well.  He got back and apparently was having chest pain sitting in the chair and EMS was called and when they got there he passed out and lost his pulses just after the fire department got there.  They put him on the floor did CPR for just a few seconds and he came to again.  Patient has a pacemaker defibrillator.  No one felt the pacemaker or solid pacemaker defibrillator go off.  Patient is not having any chest pain or shortness of breath or other complaints at present.  EMS called a STEMI at on scene and Dr. Saralyn Pilar met them here in the emergency room and [place] STEMI.  He looked at all the EKGs.         Past Medical History:  Diagnosis Date  . Atrial fibrillation ([place])   . Coronary artery disease  s/p RCA stents   . History of mitral valve repair   . Hypertension   . 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 Kidney stones   . Stroke ([place])   . Ventricular tachycardia [facility])     Patient Active Problem List   Diagnosis Date Noted  . SOB (shortness of breath)   . Dizziness   . Goals of care, counseling/discussion   . Palliative care by specialist   . Pulmonary disease   . Rib fractures 10/25/2018  . Fever 10/22/2018  . Benign prostatic hyperplasia with lower urinary tract symptoms 10/13/2018  . Nephrolithiasis 10/13/2018  . Chronic systolic heart failure ([place])   . V-tach ([place]) 01/18/2018  . Ventricular tachycardia ([place])  06/03/2017  . VT (ventricular tachycardia) ([place]) 06/03/2017    Past Surgical History:  Procedure Laterality Date  . CARDIAC CATHETERIZATION    . CORONARY ANGIOPLASTY    . Coronary stent 2009  2009  . ICD IMPLANT    . MITRAL VALVE REPAIR  October 16, 2014    Prior to Admission medications   Medication Sig Start Date End Date Taking? Authorizing Provider  acetaminophen (TYLENOL) 500 MG tablet Take 1 tablet by mouth every 8 (eight) hours as needed.    [provider]  aspirin 81 MG tablet Take 1 tablet (81 mg total) by mouth daily. 06/04/17   Tukov-Yual, Arlyss Gandy, NP  atorvastatin (LIPITOR) 20 MG tablet Take 20 mg by mouth daily. 12/03/17   [provider]  [employer] D3 50 MCG (2000 UT) CAPS Take 1 capsule by mouth daily. 09/07/19   [provider]  dabigatran (PRADAXA) 150 MG CAPS capsule Take 1 capsule (150 mg total) by mouth 2 (two) times daily. 06/04/17   Tukov-Yual, Arlyss Gandy, NP  furosemide (LASIX) 40 MG tablet Take 1 tablet (40 mg total) by mouth daily. 06/04/17   Tukov-Yual, Arlyss Gandy, NP  magnesium oxide (MAG-OX) 400 MG tablet Take 1 tablet by mouth 2 (two) times daily. 11/08/17   [provider]  metoprolol succinate (TOPROL-XL) 25 MG 24 hr tablet  Take 0.5 tablets (12.5 mg total) by mouth 2 (two) times daily. 01/22/18   Bettey Costa, MD  mexiletine (MEXITIL) 200 MG capsule Take 1 capsule (200 mg total) by mouth every 12 (twelve) hours. 01/22/18   Bettey Costa, MD  nitroGLYCERIN (NITROSTAT) 0.4 MG SL tablet Place 1 tablet (0.4 mg total) under the tongue every 5 (five) minutes as needed for chest pain. 06/04/17   Tukov-Yual, Arlyss Gandy, NP  oxyCODONE (OXY IR/ROXICODONE) 5 MG immediate release tablet Take 1 tablet (5 mg total) by mouth every 4 (four) hours as needed for moderate pain. 10/23/18   Bettey Costa, MD  potassium chloride (KLOR-CON) 10 MEQ tablet Take 10 mEq by mouth daily. 10/06/19   [provider]  predniSONE (DELTASONE) 10 MG tablet Take 1  tablet (10 mg total) by mouth daily. Day 1-3: take 4 tablets PO daily Day 4-6: take 3 tablets PO daily Day 7-9: take 2 tablets PO daily Day 10-12: take 1 tablet PO daily 10/14/19   Harvest Dark, MD  sulfamethoxazole-trimethoprim (BACTRIM) 400-80 MG tablet Take 1 tablet by mouth daily. 11/09/18   Bettey Costa, MD  TRELEGY ELLIPTA 100-62.5-25 MCG/INH AEPB INHALE 1 PUFF INTO THE LUNGS ONCE DAILY 09/01/18   [provider]  venlafaxine XR (EFFEXOR-XR) 75 MG 24 hr capsule Take 75 mg by mouth daily.  09/23/18   [provider]    Allergies Flomax [tamsulosin hcl]  No family history on file.  Social History Social History   Tobacco Use  . Smoking status: Former Smoker    Packs/day: 2.00    Years: 20.00    Pack years: 40.00    Types: Cigarettes  . Smokeless tobacco: Never Used  Substance Use Topics  . Alcohol use: Not Currently    Alcohol/week: 0.0 standard drinks  . Drug use: Never    Review of Systems Currently Constitutional: No fever/chills Eyes: No visual changes. ENT: No sore throat. Cardiovascular: Denies chest pain. Respiratory: Denies shortness of breath. Gastrointestinal: No abdominal pain.  No nausea, no vomiting.  No diarrhea.  No constipation. Genitourinary: Negative for dysuria. Musculoskeletal: Negative for back pain. Skin: Negative for rash. Neurological: Negative for headaches, focal weakness   ____________________________________________   PHYSICAL EXAM:  VITAL SIGNS: ED Triage Vitals  Enc Vitals Group     BP 03/06/20 1438 117/75     Pulse Rate 03/06/20 1438 74     Resp 03/06/20 1438 18     Temp 03/06/20 1438 97.6 F (36.4 C)     Temp Source 03/06/20 1438 Oral     SpO2 03/06/20 1438 99 %     Weight 03/06/20 1439 160 lb 15 oz (73 kg)     Height 03/06/20 1439 _0  (1.803 m)     Head Circumference --      Peak Flow --      Pain Score 03/06/20 1439 0     Pain Loc --      Pain Edu? --      Excl. in [place]? --    Constitutional:  Alert and oriented. Well appearing and in no acute distress. Eyes: Conjunctivae are normal.  Head: Atraumatic. Nose: No congestion/rhinnorhea. Mouth/Throat: Mucous membranes are moist.  Oropharynx non-erythematous. Neck: No stridor.  Cardiovascular: Normal rate, regular rhythm. Grossly normal heart sounds.  Good peripheral circulation. Respiratory: Normal respiratory effort.  No retractions. Lungs CTAB. Gastrointestinal: Soft and nontender. No distention. No abdominal bruits. No CVA tenderness. Musculoskeletal: No lower extremity tenderness nor edema.   Neurologic:  Normal  speech and language. No gross focal neurologic deficits are appreciated.  Skin:  Skin is warm, dry and intact. No rash noted.   ____________________________________________   LABS (all labs ordered are listed, but only abnormal results are displayed)  Labs Reviewed  COMPREHENSIVE METABOLIC PANEL - Abnormal; Notable for the following components:      Result Value   Glucose, Bld 134 (*)    Total Protein 6.4 (*)    All other components within normal limits  CBC WITH DIFFERENTIAL/PLATELET - Abnormal; Notable for the following components:   RBC 4.17 (*)    Hemoglobin 12.5 (*)    All other components within normal limits  TROPONIN I (HIGH SENSITIVITY)   ____________________________________________  EKG  EKG here shows normal sinus rhythm at 80 with frequent PVCs right axis  ____________________________________________  RADIOLOGY Gertha Calkin, personally viewed and evaluated these images (plain radiographs) as part of my medical decision making, as well as reviewing the written report by the radiologist.  ED MD interpretation: Chest x-ray shows cardiomegaly and some CHF radiology read the film and I agree after reviewing it.  Official radiology report(s): DG Chest Portable 1 View  Result Date: 03/06/2020 CLINICAL DATA:  Status post CPR. EXAM: PORTABLE CHEST 1 VIEW COMPARISON:  Chest x-rays dated  10/14/2019 and 11/02/2018. FINDINGS: Stable cardiomegaly. Pacer pads overlie the LEFT heart shadow. LEFT chest wall pacemaker/ICD apparatus in place. Central pulmonary vascular congestion and mild bilateral interstitial edema. No pleural effusion or pneumothorax is seen. No osseous fracture or dislocation is identified. IMPRESSION: Cardiomegaly with central pulmonary vascular congestion and mild bilateral interstitial edema indicating CHF/volume overload. No pleural effusion or pneumothorax is seen. Electronically Signed   By: Franki Cabot M.D.   On: 03/06/2020 15:05    ____________________________________________   PROCEDURES  Procedure(s) performed (including Critical Care): Critical care time half an hour.  This includes the initial STEMI call and talking to Dr. Marthe Patch about none I talked to Dr. Ubaldo Glassing about a review of his old records. Procedures   ____________________________________________   INITIAL IMPRESSION / ASSESSMENT AND PLAN / ED COURSE  Patient with apparent loss of pulses survive CPR will admit to the hospital.  Dr. Ubaldo Glassing is going to bring his pacemaker interrogator and we will interrogate his pacemaker.  Patient is having short runs of V. tach without treating his pacemaker defibrillator.  Initial troponin is negative second troponin is still pending.              ____________________________________________   FINAL CLINICAL IMPRESSION(S) / ED DIAGNOSES  Final diagnoses:  Chest pain due to myocardial ischemia, unspecified ischemic chest pain type  Successful cardiopulmonary resuscitation     ED Discharge Orders    None      *Please note:  Darius Miller was evaluated in Emergency Department on 03/06/2020 for the symptoms described in the history of present illness. He was evaluated in the context of the global COVID-19 pandemic, which necessitated consideration that the patient might be at risk for infection with the SARS-CoV-2 virus that causes COVID-19.  Institutional protocols and algorithms that pertain to the evaluation of patients at risk for COVID-19 are in a state of rapid change based on information released by regulatory bodies including the CDC and federal and state organizations. These policies and algorithms were followed during the patient's care in the ED.  Some ED evaluations and interventions may be delayed as a result of limited staffing during and the pandemic.*   Note:  This  document was prepared using Systems analyst and may include unintentional dictation errors.    Nena Polio, MD 03/06/20 1534

## 2020-03-06 NOTE — ED Notes (Signed)
HOB lowered per pt request. Pt given coke and crackers per request. NAD noted. Denies further needs at this time.

## 2020-03-06 NOTE — ED Notes (Signed)
Warm blanket given at this time 

## 2020-03-06 NOTE — ED Notes (Signed)
Medication Reconciliation Report  For Home History Technicians  HIGHLIGHTS:  1. The patient WAS personally interviewed 2. If not, what was the main source used: NOT APPLICABLE 3. Does the patient appear to take any anti-coagulation agents (e.g. warfarin, Eliquis or Xarelto): YES 4. Does the patient appear to take any anti-convulsant agents (e.g. divalproex, levetiracetam or phenytoin): NO 5. Does the patient appear to use any insulin products (e.g. Lantus, Novolin or Humalog): NO 6. Does the patient appear to take any "beta-blockers" (e.g. metoprolol, carvedilol or bisoprolol: YES  BARRIERS:  1. Were there any barriers that prevented or complicated the medication reconciliation process: NO 2. If yes, what was the primary barrier encountered: None 3. Does the patient appear compliant with prescribed medications: YES 4. Does the patient express any barriers with compliance: NO 5. What is the primary barrier the patient reports: None   NOTES:[Include any concerns, remarks or complaints the patient expresses regarding medication therapy. Any observations or other information that might be useful to the treatment team can also be included. Immediate needs or concerns should be referred to the RN or appropriate member of the treatment team.]  Patient was interviewed and reports medications as reflected in med reconciliation tab. Patient does report MEXILETINE was recently increased to TID but he has continued to take BID. Patient also reports taking ATORVASTATIN infrequently (bottle was from 04/2019) and even though he reports taking VENLAFAXINE regularly, the bottle he presented was filled 02/2019 and still had about 6 capsules left. TRELEGY has proven cost prohibitive.    Darius Miller, CPhT [place] [facility] [address] [place], [place] [address] [phone]  ** The above is intended solely for informational and/or communicative purposes. It should in  no way be considered an endorsement of any specific treatment, therapy or action. **

## 2020-03-06 NOTE — H&P (Addendum)
History and Physical    KIRIL CARPIO PXT:[id number] DOB: 1945-03-20 DOA: 03/06/2020  PCP: Gracelyn Nurse, MD  Patient coming from: home  I have personally briefly reviewed patient's old medical records in [facility]  Chief Complaint: syncope  HPI: Darius Miller is a 74 y.o. male with medical history significant of  74 year old male with a past medical history significant for coronary artery disease s/p PCI, mitral valve disease s/p repair in 2016, history of ventricular tachycardia with CRT-D in place, paroxsymal atrial fibrillation, anticoagulated with Pradaxa, and hyperlipidemia who presented to ed brought in by EMS with what appear to be a syncopal event. Per patient this am while walking his dog he became very dizzy and felt faint. AT time he yelled to a neighbor who called his wife. He states he remembers getting to the house sitting in his recliner and the next thing her remembers is waking up on the floor. He notes associated chest pain and palpitations ,but no sob, n/v/d/abdominal pain , fever /chills/cough / dysuria preceding episode or after episode.  Per records EMS documented a loss of pulse and initiation of cpr which was short lived only few seconds with  ROSC,. Prior to one complete cycle.  Patient currently states he feels back to his baseline and has no complaint currently in ED. Of note patient was evaluated by cardiology and icd interrogated and it was notable for SVT 180's and note no firing of ICD.  ED Course:  Vitals: 97.6, hr 117/75, hr 74, rr 18 sat 99% on ra Labs:  Wbc 6.6, hgb 12.5 at baseline  Na 1411, cr 0.91, glu 134 ce 17,34 +delta OEV:OJJK with pvc KXF:[id number] with central pulmonary vascular congestion and mild bilateral interstitial edema indicating CHF/volume overload. No pleural effusion or pneumothorax is seen.  Review of Systems: As per HPI otherwise 10 point review of systems negative.   Past Medical History:  Diagnosis Date  .  Atrial fibrillation (HCC)   . Coronary artery disease  s/p RCA stents   . History of mitral valve repair   . Hypertension   . 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 Kidney stones   . Stroke (HCC)   . Ventricular tachycardia [facility])     Past Surgical History:  Procedure Laterality Date  . CARDIAC CATHETERIZATION    . CORONARY ANGIOPLASTY    . Coronary stent 2009  2009  . ICD IMPLANT    . MITRAL VALVE REPAIR  October 16, 2014     reports that he has quit smoking. His smoking use included cigarettes. He has a 40.00 pack-year smoking history. He has never used smokeless tobacco. He reports previous alcohol use. He reports that he does not use drugs.  Allergies  Allergen Reactions  . Flomax [Tamsulosin Hcl]     No family history on file.  Prior to Admission medications   Medication Sig Start Date End Date Taking? Authorizing Provider  acetaminophen (TYLENOL) 500 MG tablet Take 500-1,000 mg by mouth every 8 (eight) hours as needed for mild pain or moderate pain.   Yes [provider]  aspirin 81 MG tablet Take 1 tablet (81 mg total) by mouth daily. 06/04/17  Yes Tukov-Yual, Magdalene S, NP  atorvastatin (LIPITOR) 20 MG tablet Take 20 mg by mouth daily. 12/03/17  Yes [provider]  [employer] D3 50 MCG (2000 UT) CAPS Take 2,000 Units by mouth daily.   Yes [provider]  dabigatran (PRADAXA) 150 MG CAPS capsule Take 1 capsule (150 mg total) by mouth 2 (two) times daily. 06/04/17  Yes Tukov-Yual, Alroy Bailiff, NP  fludrocortisone (FLORINEF) 0.1 MG tablet Take 100 mcg by mouth daily. 01/12/20  Yes [provider]  furosemide (LASIX) 20 MG tablet Take 20 mg by mouth daily. 12/24/19  Yes [provider]  ipratropium (ATROVENT) 0.03 % nasal spray Place 1-2 sprays into both nostrils 2 (two) times daily as needed for rhinitis.   Yes [provider]  metoprolol succinate (TOPROL-XL) 25 MG 24 hr tablet  Take 12.5 mg by mouth 2 (two) times daily.   Yes [provider]  mexiletine (MEXITIL) 200 MG capsule Take 200 mg by mouth 2 (two) times daily.   Yes [provider]  nitroGLYCERIN (NITROSTAT) 0.4 MG SL tablet Place 1 tablet (0.4 mg total) under the tongue every 5 (five) minutes as needed for chest pain. 06/04/17  Yes Tukov-Yual, Alroy Bailiff, NP  Potassium Chloride ER 20 MEQ TBCR Take 40 mEq by mouth daily.   Yes [provider]  TRELEGY ELLIPTA 100-62.5-25 MCG/INH AEPB Inhale 1 puff into the lungs daily as needed (respiratory problems).   Yes [provider]  venlafaxine XR (EFFEXOR-XR) 75 MG 24 hr capsule Take 75 mg by mouth daily.  09/23/18  Yes [provider]  vitamin B-12 (CYANOCOBALAMIN) 1000 MCG tablet Take 1,000 mcg by mouth daily.   Yes [provider]    Physical Exam: Vitals:   03/06/20 1930 03/06/20 1945 03/06/20 2000 03/06/20 2015  BP: (!) 144/66 123/72 (!) 148/72 (!) 141/88  Pulse: 70 65 65 66  Resp: (!) 24 (!) 27 (!) 24 20  Temp:      TempSrc:      SpO2: 94% 96% 96% 97%  Weight:      Height:        Constitutional: NAD, calm, comfortable Vitals:   03/06/20 1930 03/06/20 1945 03/06/20 2000 03/06/20 2015  BP: (!) 144/66 123/72 (!) 148/72 (!) 141/88  Pulse: 70 65 65 66  Resp: (!) 24 (!) 27 (!) 24 20  Temp:      TempSrc:      SpO2: 94% 96% 96% 97%  Weight:      Height:       Eyes: PERRL, lids and conjunctivae normal ENMT: Mucous membranes are moist. Posterior pharynx clear of any exudate or lesions.Normal dentition.  Neck: normal, supple, no masses, no thyromegaly Respiratory: clear to auscultation bilaterally, no wheezing, no crackles. Normal respiratory effort. No accessory muscle use.  Cardiovascular: Regular rate and rhythm, no murmurs / rubs / gallops. No extremity edema. 2+ pedal pulses. No carotid bruits.  Abdomen: no tenderness, no masses palpated. No hepatosplenomegaly. Bowel sounds positive.   Musculoskeletal: no clubbing / cyanosis. No joint deformity upper and lower extremities. Good ROM, no contractures. Normal muscle tone.  Skin: no rashes, lesions, ulcers. No induration Neurologic: CN 2-12 grossly intact. Sensation intact,  Strength 5/5 in all 4.  Psychiatric: Normal judgment and insight. Alert and oriented x 3. Normal mood.    Labs on Admission: I have personally reviewed following labs and imaging studies  CBC: Recent Labs  Lab 03/06/20 1429  WBC 6.6  NEUTROABS 4.1  HGB 12.5*  HCT 39.5  MCV 94.7  PLT 182   Basic Metabolic Panel: Recent Labs  Lab 03/06/20 1429  NA 141  K 3.8  CL 103  CO2 31  GLUCOSE 134*  BUN 11  CREATININE 0.91  CALCIUM  9.3   GFR: Estimated Creatinine Clearance: 74.6 mL/min (by C-G formula based on SCr of 0.91 mg/dL). Liver Function Tests: Recent Labs  Lab 03/06/20 1429  AST 16  ALT 9  ALKPHOS 82  BILITOT 0.8  PROT 6.4*  ALBUMIN 3.6   No results for input(s): LIPASE, AMYLASE in the last 168 hours. No results for input(s): AMMONIA in the last 168 hours. Coagulation Profile: No results for input(s): INR, PROTIME in the last 168 hours. Cardiac Enzymes: No results for input(s): CKTOTAL, CKMB, CKMBINDEX, TROPONINI in the last 168 hours. BNP (last 3 results) No results for input(s): PROBNP in the last 8760 hours. HbA1C: No results for input(s): HGBA1C in the last 72 hours. CBG: No results for input(s): GLUCAP in the last 168 hours. Lipid Profile: No results for input(s): CHOL, HDL, LDLCALC, TRIG, CHOLHDL, LDLDIRECT in the last 72 hours. Thyroid Function Tests: No results for input(s): TSH, T4TOTAL, FREET4, T3FREE, THYROIDAB in the last 72 hours. Anemia Panel: No results for input(s): VITAMINB12, FOLATE, FERRITIN, TIBC, IRON, RETICCTPCT in the last 72 hours. Urine analysis:    Component Value Date/Time   COLORURINE YELLOW (A) 10/22/2018 1208   APPEARANCEUR Cloudy (A) 11/23/2019 1400   LABSPEC 1.013 10/22/2018 1208    PHURINE 7.0 10/22/2018 1208   GLUCOSEU Negative 11/23/2019 1400   HGBUR MODERATE (A) 10/22/2018 1208   BILIRUBINUR Negative 11/23/2019 1400   KETONESUR NEGATIVE 10/22/2018 1208   PROTEINUR 2+ (A) 11/23/2019 1400   PROTEINUR 30 (A) 10/22/2018 1208   NITRITE Negative 11/23/2019 1400   NITRITE NEGATIVE 10/22/2018 1208   LEUKOCYTESUR Trace (A) 11/23/2019 1400   LEUKOCYTESUR TRACE (A) 10/22/2018 1208    Radiological Exams on Admission: DG Chest Portable 1 View  Result Date: 03/06/2020 CLINICAL DATA:  Status post CPR. EXAM: PORTABLE CHEST 1 VIEW COMPARISON:  Chest x-rays dated 10/14/2019 and 11/02/2018. FINDINGS: Stable cardiomegaly. Pacer pads overlie the LEFT heart shadow. LEFT chest wall pacemaker/ICD apparatus in place. Central pulmonary vascular congestion and mild bilateral interstitial edema. No pleural effusion or pneumothorax is seen. No osseous fracture or dislocation is identified. IMPRESSION: Cardiomegaly with central pulmonary vascular congestion and mild bilateral interstitial edema indicating CHF/volume overload. No pleural effusion or pneumothorax is seen. Electronically Signed   By: Bary Richard M.D.   On: 03/06/2020 15:05    EKG: Independently reviewed. See above  Assessment/Plan Syncope due to SVT  -admit to progressive care  -monitor on tele -await final cardiology rec -continue toprol and mexiletine -monitor electrolytes check mag  Abnormal CE in background of CAD s/p pci -due to demand  -will cycle ensure it trends downwards  -echo in am  -resume cardiac regimen   Afib -continue pradaxa  -continue toprol/mexiletine  Hx of CHFrEF  -however last echo 10/2018 noted ef of 55 %  -possible mild exacerbation noted fluid on cxr -check BNP  - patient on physical exam without any signs of heart failure -cxr portable  -repeat cxr pa/lat  -will not treat as acute exacerbation at this time will monitor -strict I/o daily wt, f/u on echo in am  -resume home lasix     Hx of VT s/p ICD -ICD interrogated no discharge noted   HTN -stable  -resume home regimen   COPD -continue home inhalers    DVT prophylaxis: on pradaxa  Code Status: Full Family Communication: n/a Consults called: cardiology [facility] Dr Lady Gary) Admission status: obs  Lurline Del MD [facility]  If 7PM-7AM, please contact night-coverage [email or web address] Password [email or web address]  03/06/2020, 10:17 PM

## 2020-03-06 NOTE — ED Triage Notes (Addendum)
Pt arrived via [facility] with c/o STEMI called by EMS. Pt arrived A&O, able to answer questions appropriately at this time.    Per EMS, pt was being assessed by [employer] when he lost conciousness. Per EMS, BFD did not feel pulses, started CPR and called EMS.   Per EMS, pt has no chest pain at this time, but had pain 1 huor ago, and has had multiple rhythm changes in the last few minutes.   Per Pt, pt has hx of stent placement, pacemaker, defibrillator.   Per pt, he got "swimmy headed" while walking the dog

## 2020-03-06 NOTE — ED Notes (Addendum)
Cardiology and EDP Malinda at bedside

## 2020-03-06 NOTE — ED Notes (Signed)
Ice water given at this time

## 2020-03-06 NOTE — ED Notes (Signed)
Pt states he took a baby aspirin this morning. Pt also states "my urologist told me I have a big stone" Pt unknown if bladder/kidney stone at this time but states "he wants to do a procedure"  Pt states he has blood in his urine but denies blood in stool at this time

## 2020-03-06 NOTE — Consult Note (Signed)
CARDIOLOGY CONSULT NOTE               Patient ID: Darius Miller MRN: [id number] DOB/AGE: Dec 12, 1945 74 y.o.  Admit date: 03/06/2020 Referring Physician Dr. Dorothea Glassman  Primary Physician Dr. Marcelino Duster  Primary Cardiologist Dr. 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  He was out walking his dog when he suddenly felt dizzy and developed chest pain.  Due to progressive chest pain, EMS was called, pulses weren't able to be obtained, and they performed CPR for a few seconds.  Patient has ICD in place which did not fire - confirmed by interrogation in the ED.   He is followed closely at [facility].  He is currently on mexiletine 200mg  BID and metoprolol 12.5mg  BID.  Amiodarone was discontinued in the past due to pulmonary opacities.  Echocardiogram in August of 2020 revealed normal LV systolic function with an EF estimated between 55-60% with mild LVH; left atria was mild to moderately dilated, right atria was mildly dilated; aortic valve was mildly thickened.   03/06/20: Darius Miller is currently lying in bed, in no acute distress.  He denies any symptoms at present including chest pain, chest pressure, palpitations, shortness of breath, lower extremity swelling, orthopnea, PND, dizziness, or lightheadedness.   Review of systems complete and found to be negative unless listed above     Past Medical History:  Diagnosis Date  . Atrial fibrillation (HCC)   . Coronary artery disease  s/p RCA stents   . History of mitral valve repair   . Hypertension   . ICD (implantable cardioverter-defibrillator),  biventricular, Medtronic NO  atrial lead   . Kidney stones   . Stroke (HCC)   . Ventricular tachycardia [facility])     Past Surgical History:  Procedure Laterality Date  . CARDIAC CATHETERIZATION    . CORONARY ANGIOPLASTY    . Coronary stent 2009  2009  . ICD IMPLANT    . MITRAL VALVE REPAIR  October 16, 2014    (Not in a hospital admission)  Social History   Socioeconomic History  . Marital status: Single    Spouse name: Not on file  . Number of children: Not on file  . Years of education: Not on file  . Highest education level: Not on file  Occupational History  . Not on file  Tobacco Use  . Smoking status: Former Smoker    Packs/day: 2.00    Years: 20.00    Pack years: 40.00    Types: Cigarettes  . Smokeless tobacco: Never Used  Substance and Sexual Activity  . Alcohol use: Not Currently    Alcohol/week: 0.0 standard drinks  . Drug use: Never  . Sexual activity: Not on file  Other Topics Concern  . Not on file  Social History Narrative  . Not on file   Social Determinants of Health   Financial Resource Strain: Not on file  Food Insecurity: Not on file  Transportation Needs: Not on file  Physical Activity: Not on file  Stress: Not on file  Social Connections: Not on file  Intimate Partner Violence: Not on file  No family history on file.    Review of systems complete and found to be negative unless listed above      PHYSICAL EXAM  General: Well developed, well nourished, in no acute distress HEENT:  Normocephalic and atramatic Neck:  No JVD.  Lungs: Clear bilaterally to auscultation and percussion. Heart: Irregularly irregular, rate controlled. Normal S1 and S2 without gallops or murmurs.  Abdomen: Bowel sounds are positive, abdomen soft and non-tender  Msk:  Back normal. Normal strength and tone for age. Extremities: No clubbing, cyanosis or edema.   Neuro: Alert and oriented X 3. Psych:  Good affect, responds appropriately  Labs:   Lab Results   Component Value Date   WBC 6.6 03/06/2020   HGB 12.5 (L) 03/06/2020   HCT 39.5 03/06/2020   MCV 94.7 03/06/2020   PLT 182 03/06/2020    Recent Labs  Lab 03/06/20 1429  NA 141  K 3.8  CL 103  CO2 31  BUN 11  CREATININE 0.91  CALCIUM 9.3  PROT 6.4*  BILITOT 0.8  ALKPHOS 82  ALT 9  AST 16  GLUCOSE 134*   Lab Results  Component Value Date   TROPONINI 0.03 (HH) 01/19/2018    Lab Results  Component Value Date   CHOL 216 (H) 06/04/2017   Lab Results  Component Value Date   HDL 24 (L) 06/04/2017   Lab Results  Component Value Date   LDLCALC 166 (H) 06/04/2017   Lab Results  Component Value Date   TRIG 130 06/04/2017   Lab Results  Component Value Date   CHOLHDL 9.0 06/04/2017   No results found for: LDLDIRECT    Radiology: DG Chest Portable 1 View  Result Date: 03/06/2020 CLINICAL DATA:  Status post CPR. EXAM: PORTABLE CHEST 1 VIEW COMPARISON:  Chest x-rays dated 10/14/2019 and 11/02/2018. FINDINGS: Stable cardiomegaly. Pacer pads overlie the LEFT heart shadow. LEFT chest wall pacemaker/ICD apparatus in place. Central pulmonary vascular congestion and mild bilateral interstitial edema. No pleural effusion or pneumothorax is seen. No osseous fracture or dislocation is identified. IMPRESSION: Cardiomegaly with central pulmonary vascular congestion and mild bilateral interstitial edema indicating CHF/volume overload. No pleural effusion or pneumothorax is seen. Electronically Signed   By: Bary Richard M.D.   On: 03/06/2020 15:05    EKG: Atrial fibrillation, frequent PVCs, rate controlled   ASSESSMENT AND PLAN:  1.  Syncope   -Pacemaker/ICD interrogated per Dr. Lady Gary in the ED; no evidence of ventricular tachycardia or terminated episodes   -Episode of SVT, rates in the 180s, around the time of the syncopal episode   -Will discuss with [facility] to guide further thearpy   2.  History of ventricular tachycardia with ICD in place  -No evidence of  ventricular tachycardia on interrogation   3.  Atrial fibrillation   -Rate currently controlled  -Continue anticoagulation with Pradaxa   4.  Coronary artery disease s/p PCI  -Syncopal episode likely secondary to SVT, not ACS; first troponin negative   5.  Mitral valve disease s/p repair   -Valvuloplasty in 2016 at [facility]   Signed: Andi Hence PA-C 03/06/2020, 4:26 PM

## 2020-03-07 ENCOUNTER — Observation Stay: Payer: Medicare HMO

## 2020-03-07 DIAGNOSIS — I472 Ventricular tachycardia: Secondary | ICD-10-CM | POA: Diagnosis not present

## 2020-03-07 LAB — COMPREHENSIVE METABOLIC PANEL
ALT: 9 U/L (ref 0–44)
AST: 14 U/L — ABNORMAL LOW (ref 15–41)
Albumin: 3.1 g/dL — ABNORMAL LOW (ref 3.5–5.0)
Alkaline Phosphatase: 71 U/L (ref 38–126)
Anion gap: 8 (ref 5–15)
BUN: 12 mg/dL (ref 8–23)
CO2: 29 mmol/L (ref 22–32)
Calcium: 8.6 mg/dL — ABNORMAL LOW (ref 8.9–10.3)
Chloride: 103 mmol/L (ref 98–111)
Creatinine, Ser: 0.85 mg/dL (ref 0.61–1.24)
GFR, Estimated: >60 mL/min (ref >60)
Glucose, Bld: 92 mg/dL (ref 70–99)
Potassium: 3.3 mmol/L — ABNORMAL LOW (ref 3.5–5.1)
Sodium: 140 mmol/L (ref 135–145)
Total Bilirubin: 0.8 mg/dL (ref 0.3–1.2)
Total Protein: 5.6 g/dL — ABNORMAL LOW (ref 6.5–8.1)

## 2020-03-07 LAB — BASIC METABOLIC PANEL
Anion gap: 9 (ref 5–15)
BUN: 12 mg/dL (ref 8–23)
CO2: 31 mmol/L (ref 22–32)
Calcium: 9.3 mg/dL (ref 8.9–10.3)
Chloride: 103 mmol/L (ref 98–111)
Creatinine, Ser: 0.92 mg/dL (ref 0.61–1.24)
GFR, Estimated: >60 mL/min (ref >60)
Glucose, Bld: 95 mg/dL (ref 70–99)
Potassium: 3.6 mmol/L (ref 3.5–5.1)
Sodium: 143 mmol/L (ref 135–145)

## 2020-03-07 LAB — MAGNESIUM: Magnesium: 1.8 mg/dL (ref 1.7–2.4)

## 2020-03-07 LAB — TSH: TSH: 0.693 u[IU]/mL (ref 0.350–4.500)

## 2020-03-07 LAB — TROPONIN I (HIGH SENSITIVITY)
Troponin I (High Sensitivity): 73 ng/L — ABNORMAL HIGH (ref <18)
Troponin I (High Sensitivity): 99 ng/L — ABNORMAL HIGH (ref <18)

## 2020-03-07 LAB — BRAIN NATRIURETIC PEPTIDE: B Natriuretic Peptide: 729.2 pg/mL — ABNORMAL HIGH (ref 0.0–100.0)

## 2020-03-07 LAB — PHOSPHORUS: Phosphorus: 3.6 mg/dL (ref 2.5–4.6)

## 2020-03-07 MED ORDER — FLUTICASONE FUROATE-VILANTEROL 100-25 MCG/INH IN AEPB
1.0000 | INHALATION_SPRAY | Freq: Every day | RESPIRATORY_TRACT | Status: DC | PRN
  Filled 2020-03-07: qty 28

## 2020-03-07 MED ORDER — MEXILETINE HCL 200 MG PO CAPS: 200.0000 mg | ORAL_CAPSULE | Freq: Three times a day (TID) | ORAL | 0 refills | Status: AC

## 2020-03-07 MED ORDER — OXYCODONE-ACETAMINOPHEN 5-325 MG PO TABS
1.0000 | ORAL_TABLET | Freq: Once | ORAL | Status: AC
  Administered 2020-03-07: 05:00:00 1 via ORAL
  Filled 2020-03-07: qty 1

## 2020-03-07 MED ORDER — MEXILETINE HCL 200 MG PO CAPS
200.0000 mg | ORAL_CAPSULE | Freq: Three times a day (TID) | ORAL | Status: DC
  Filled 2020-03-07 (×2): qty 1

## 2020-03-07 MED ORDER — MEXILETINE HCL 200 MG PO CAPS
200.0000 mg | ORAL_CAPSULE | Freq: Three times a day (TID) | ORAL | Status: DC
  Filled 2020-03-07: qty 1

## 2020-03-07 MED ORDER — MORPHINE SULFATE (PF) 2 MG/ML IV SOLN: 2.0000 mg | Freq: Once | INTRAVENOUS | Status: DC

## 2020-03-07 MED ORDER — MORPHINE SULFATE (PF) 2 MG/ML IV SOLN
2.0000 mg | Freq: Once | INTRAVENOUS | Status: AC
  Administered 2020-03-07: 02:00:00 2 mg via INTRAVENOUS
  Filled 2020-03-07: qty 1

## 2020-03-07 MED ORDER — HYDROCODONE-ACETAMINOPHEN 5-325 MG PO TABS
1.0000 | ORAL_TABLET | Freq: Once | ORAL | Status: AC
  Administered 2020-03-07: 11:00:00 1 via ORAL
  Filled 2020-03-07: qty 1

## 2020-03-07 MED ORDER — UMECLIDINIUM BROMIDE 62.5 MCG/INH IN AEPB
1.0000 | INHALATION_SPRAY | Freq: Every day | RESPIRATORY_TRACT | Status: DC | PRN
  Filled 2020-03-07: qty 7

## 2020-03-07 MED ORDER — METOPROLOL SUCCINATE ER 25 MG PO TB24: 25.0000 mg | ORAL_TABLET | Freq: Two times a day (BID) | ORAL | 0 refills | Status: AC

## 2020-03-07 MED ORDER — METOPROLOL SUCCINATE ER 50 MG PO TB24
25.0000 mg | ORAL_TABLET | Freq: Two times a day (BID) | ORAL | Status: DC
  Administered 2020-03-07: 10:00:00 25 mg via ORAL
  Filled 2020-03-07: qty 1

## 2020-03-07 NOTE — Discharge Summary (Signed)
Physician Discharge Summary   Darius Miller  male DOB: Oct 03, 1945  DTO:[id number]  PCP: Gracelyn Nurse, MD  Admit date: 03/06/2020 Discharge date: 03/07/2020  Admitted From: home Disposition:  home CODE STATUS: Full code  Discharge Instructions    Discharge instructions   Complete by: As directed    After discussing with your cardiologist at [facility], we have made 2 changes to your medications. Mexilitine increased to 200 mg three times a day and Toprol increased to 25 mg twice a day.  Please follow up with [facility] next week.   Dr. Darlin Priestly [facility] Course:  For full details, please see H&P, progress notes, consult notes and ancillary notes.  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   He notes associated chest pain and palpitations.  Per records EMS documented a loss of pulse and initiation of cpr which was short lived only few seconds with  ROSC.  Syncope due to VTach Hx of VT s/p ICD Cardiology was consulted.  Pacemaker/ICD interrogated per Dr. Lady Gary in the ED and found episode of wide-complex probable ventricular tachycardia the etiology of his syncope.  This was not recognized as V. tach by his device, and device did not fire.  Pt was monitored on tele and remained in atrial fibrillation with no sustained ventricular arrhythmia.  Dr. Lady Gary discussed with his electrophysiology team at [facility] who recommended increasing mexiletine to 200 mg 3 times daily and increasing Toprol-XL to 25 mg twice daily.  They will see him back in the office next week for adjustment of his defibrillator parameters and for further medical management.  Cardiology cleared pt for discharge.  Mild trop elevation due to demand ischemia Trop peaked at 99.    Hx of CAD s/p PCI Continued home ASA 81 and statin.  Afib Continued pradaxa.  Home Toprol-XL increased to 25 mg twice daily.  Hx of CHFrEF  however last echo 10/2018 noted ef of 55 %.  Pt did not appear to be in CHF exacerbation.  Continued home lasix.  Home Toprol-XL increased to 25 mg twice daily.  HTN Stable.  Continued home lasix.  Home Toprol-XL increased to 25 mg twice daily.  COPD Continued home inhalers  MSK chest pain From brief CPR.  CXR showed no acute rib fracture.   Discharge Diagnoses:  Active Problems:   SVT (supraventricular tachycardia) (HCC)   SVT (supraventricular tachycardia) (HCC)    Discharge Instructions:  Allergies as of 03/07/2020      Reactions   Flomax [tamsulosin Hcl]       Medication List    TAKE these medications   acetaminophen 500 MG tablet Commonly known as: TYLENOL Take 500-1,000 mg by mouth every 8 (eight) hours as needed for mild pain or moderate pain.   aspirin 81 MG tablet Take 1 tablet (81 mg total) by mouth daily.   atorvastatin 20 MG tablet Commonly known as: LIPITOR Take 20 mg by mouth daily.   [employer] D3 50 MCG (2000 UT) Caps Generic drug: Cholecalciferol Take 2,000 Units by mouth daily.   dabigatran 150 MG Caps capsule Commonly known as: PRADAXA Take 1 capsule (150 mg  total) by mouth 2 (two) times daily.   fludrocortisone 0.1 MG tablet Commonly known as: FLORINEF Take 100 mcg by mouth daily.   furosemide 20 MG tablet Commonly known as: LASIX Take 20 mg by mouth daily.   ipratropium 0.03 % nasal spray Commonly known as: ATROVENT Place 1-2 sprays into both nostrils 2 (two) times daily as needed for rhinitis.   metoprolol succinate 25 MG 24 hr tablet Commonly known as: TOPROL-XL Take 1 tablet (25 mg total) by mouth 2 (two) times daily. This is an increase from 12.5 mg twice daily. What changed:   how  much to take  additional instructions   mexiletine 200 MG capsule Commonly known as: MEXITIL Take 1 capsule (200 mg total) by mouth 3 (three) times daily. This is an increase from 2 times daily. What changed:   when to take this  additional instructions   nitroGLYCERIN 0.4 MG SL tablet Commonly known as: NITROSTAT Place 1 tablet (0.4 mg total) under the tongue every 5 (five) minutes as needed for chest pain.   Potassium Chloride ER 20 MEQ Tbcr Take 40 mEq by mouth daily.   Trelegy Ellipta 100-62.5-25 MCG/INH Aepb Generic drug: Fluticasone-Umeclidin-Vilant Inhale 1 puff into the lungs daily as needed (respiratory problems).   venlafaxine XR 75 MG 24 hr capsule Commonly known as: EFFEXOR-XR Take 75 mg by mouth daily.   vitamin B-12 1000 MCG tablet Commonly known as: CYANOCOBALAMIN Take 1,000 mcg by mouth daily.        Follow-up Information    Etta Quill, MD Follow up in 1 week(s).   Specialty: Cardiology Contact information: [address] [place] [address] [phone]        Gracelyn Nurse, MD. Schedule an appointment as soon as possible for a visit in 1 week(s).   Specialty: Internal Medicine Contact information: [address] [place] [address] [phone]               Allergies  Allergen Reactions  . Flomax [Tamsulosin Hcl]      The results of significant diagnostics from this hospitalization (including imaging, microbiology, ancillary and laboratory) are listed below for reference.   Consultations:   Procedures/Studies: DG Chest Portable 1 View  Result Date: 03/06/2020 CLINICAL DATA:  Status post CPR. EXAM: PORTABLE CHEST 1 VIEW COMPARISON:  Chest x-rays dated 10/14/2019 and 11/02/2018. FINDINGS: Stable cardiomegaly. Pacer pads overlie the LEFT heart shadow. LEFT chest wall pacemaker/ICD apparatus in place. Central pulmonary vascular congestion and mild bilateral interstitial edema. No pleural  effusion or pneumothorax is seen. No osseous fracture or dislocation is identified. IMPRESSION: Cardiomegaly with central pulmonary vascular congestion and mild bilateral interstitial edema indicating CHF/volume overload. No pleural effusion or pneumothorax is seen. Electronically Signed   By: Bary Richard M.D.   On: 03/06/2020 15:05      Labs: BNP (last 3 results) Recent Labs    03/06/20 2355  BNP 729.2*   Basic Metabolic Panel: Recent Labs  Lab 03/06/20 1429 03/06/20 2355 03/07/20 0426  NA 141 143 140  K 3.8 3.6 3.3*  CL 103 103 103  CO2 31 31 29   GLUCOSE 134* 95 92  BUN 11 12 12   CREATININE 0.91 0.92 0.85  CALCIUM 9.3 9.3 8.6*  MG  --  1.8  --   PHOS  --  3.6  --    Liver Function Tests: Recent Labs  Lab 03/06/20 1429 03/07/20 0426  AST 16 14*  ALT 9 9  ALKPHOS  82 71  BILITOT 0.8 0.8  PROT 6.4* 5.6*  ALBUMIN 3.6 3.1*   No results for input(s): LIPASE, AMYLASE in the last 168 hours. No results for input(s): AMMONIA in the last 168 hours. CBC: Recent Labs  Lab 03/06/20 1429  WBC 6.6  NEUTROABS 4.1  HGB 12.5*  HCT 39.5  MCV 94.7  PLT 182   Cardiac Enzymes: No results for input(s): CKTOTAL, CKMB, CKMBINDEX, TROPONINI in the last 168 hours. BNP: Invalid input(s): POCBNP CBG: No results for input(s): GLUCAP in the last 168 hours. D-Dimer No results for input(s): DDIMER in the last 72 hours. Hgb A1c No results for input(s): HGBA1C in the last 72 hours. Lipid Profile No results for input(s): CHOL, HDL, LDLCALC, TRIG, CHOLHDL, LDLDIRECT in the last 72 hours. Thyroid function studies Recent Labs    03/06/20 2355  TSH 0.693   Anemia work up No results for input(s): VITAMINB12, FOLATE, FERRITIN, TIBC, IRON, RETICCTPCT in the last 72 hours. Urinalysis    Component Value Date/Time   COLORURINE YELLOW (A) 10/22/2018 1208   APPEARANCEUR Cloudy (A) 11/23/2019 1400   LABSPEC 1.013 10/22/2018 1208   PHURINE 7.0 10/22/2018 1208   GLUCOSEU Negative  11/23/2019 1400   HGBUR MODERATE (A) 10/22/2018 1208   BILIRUBINUR Negative 11/23/2019 1400   KETONESUR NEGATIVE 10/22/2018 1208   PROTEINUR 2+ (A) 11/23/2019 1400   PROTEINUR 30 (A) 10/22/2018 1208   NITRITE Negative 11/23/2019 1400   NITRITE NEGATIVE 10/22/2018 1208   LEUKOCYTESUR Trace (A) 11/23/2019 1400   LEUKOCYTESUR TRACE (A) 10/22/2018 1208   Sepsis Labs Invalid input(s): PROCALCITONIN,  WBC,  LACTICIDVEN Microbiology No results found for this or any previous visit (from the past 240 hour(s)).   Total time spend on discharging this patient, including the last patient exam, discussing the hospital stay, instructions for ongoing care as it relates to all pertinent caregivers, as well as preparing the medical discharge records, prescriptions, and/or referrals as applicable, is 45 minutes.    Darlin Priestly, MD  [facility] 03/07/2020, 9:47 AM

## 2020-03-07 NOTE — Care Management CC44 (Signed)
Condition Code 44 Documentation Completed  Patient Details  Name: SHIZUO BISKUP MRN: [id number] Date of Birth: 09-Jan-1946   Condition Code 44 given:  Yes Patient signature on Condition Code 44 notice:  Yes Documentation of 2 MD's agreement:  Yes Code 44 added to claim:  Yes    Joseph Art, LCSWA 03/07/2020, 10:36 AM

## 2020-03-07 NOTE — Progress Notes (Signed)
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  He was out walking his dog when he suddenly felt dizzy and developed chest pain.  Due to progressive chest pain, EMS was called, pulses weren't able to be obtained, and they performed CPR for a few seconds.  Patient has ICD in place which did not fire - confirmed by interrogation in the ED. device did not recognize the tachycardia as V. tach and did not antitachycardia pacing or defibrillator.  He is followed closely at [facility].  He is currently on mexiletine 200mg  BID and metoprolol 12.5mg  BID.  Amiodarone was discontinued in the past due to pulmonary opacities.  Echocardiogram in August of 2020 revealed normal LV systolic function with an EF estimated between 55-60% with mild LVH; left atria was mild to moderately dilated, right atria was mildly dilated; aortic valve was mildly thickened.  Recommendation had been to increase the mexiletine to 200 mg 3 times daily which the patient did for a period of time and then went back to twice daily.  03/06/20: Darius Miller is currently lying in bed, in no acute distress.  He denies any symptoms at present including chest pain, chest pressure, palpitations, shortness of breath, lower extremity swelling, orthopnea, PND, dizziness, or lightheadedness.  03/07/20 doing well this morning.  No further arrhythmia on telemetry review.  Remains in atrial fibrillation with no sustained  ventricular arrhythmia.  Discussed with his electrophysiology team at [facility].  They agreed with recommendations of increasing mexiletine to 200 mg 3 times daily and increasing Toprol-XL to 25 mg twice daily.  They will see him back in the office next week for adjustment of his defibrillator parameters and for further medical management.  Okay for discharge today from a cardiac standpoint. Subjective     Inpatient Medications    . aspirin EC  81 mg Oral Daily  . atorvastatin  20 mg Oral Daily  . dabigatran  150 mg Oral BID  . fludrocortisone  100 mcg Oral Daily  . furosemide  20 mg Oral Daily  . metoprolol succinate  12.5 mg Oral BID  . mexiletine  200 mg Oral BID  . potassium chloride  40 mEq Oral BID  . venlafaxine XR  75 mg Oral Daily  . vitamin B-12  1,000 mcg Oral Daily    Vital Signs    Vitals:   03/07/20 0115 03/07/20 0233 03/07/20 0343 03/07/20 0632  BP: 137/84 (!) 131/105 (!) 145/102 130/88  Pulse: 69 (!) 51 (!) 46 86  Resp: 12 (!) 21 20 15   Temp:      TempSrc:      SpO2: 90% 91% 91% 91%  Weight:      Height:        Intake/Output Summary (Last 24 hours) at 03/07/2020 0809 Last data filed at 03/06/2020 1750 Gross per 24 hour  Intake --  Output 1050 ml  Net -1050 ml   Filed Weights   03/06/20 1439  Weight: 73 kg  Physical Exam    GEN: Well nourished, well developed, in no acute distress.  HEENT: normal.  Neck: Supple, no JVD, carotid bruits, or masses. Cardiac: Irregular regular rhythm.  Respiratory:  Respirations regular and unlabored, clear to auscultation bilaterally. GI: Soft, nontender, nondistended, BS + x 4. MS: no deformity or atrophy. Skin: warm and dry, no rash. Neuro:  Strength and sensation are intact. Psych: Normal affect.  Labs    CBC Recent Labs    03/06/20 1429  WBC 6.6  NEUTROABS 4.1  HGB 12.5*  HCT 39.5  MCV 94.7  PLT 182   Basic Metabolic Panel Recent Labs    16/10/96 2355 03/07/20 0426  NA 143 140  K 3.6  3.3*  CL 103 103  CO2 31 29  GLUCOSE 95 92  BUN 12 12  CREATININE 0.92 0.85  CALCIUM 9.3 8.6*  MG 1.8  --   PHOS 3.6  --    Liver Function Tests Recent Labs    03/06/20 1429 03/07/20 0426  AST 16 14*  ALT 9 9  ALKPHOS 82 71  BILITOT 0.8 0.8  PROT 6.4* 5.6*  ALBUMIN 3.6 3.1*   No results for input(s): LIPASE, AMYLASE in the last 72 hours. Cardiac Enzymes No results for input(s): CKTOTAL, CKMB, CKMBINDEX, TROPONINI in the last 72 hours. BNP Recent Labs    03/06/20 2355  BNP 729.2*   D-Dimer No results for input(s): DDIMER in the last 72 hours. Hemoglobin A1C No results for input(s): HGBA1C in the last 72 hours. Fasting Lipid Panel No results for input(s): CHOL, HDL, LDLCALC, TRIG, CHOLHDL, LDLDIRECT in the last 72 hours. Thyroid Function Tests Recent Labs    03/06/20 2355  TSH 0.693    Telemetry    Atrial fibrillation with variable but fairly well controlled ventricular response  ECG    A. fib with variable ventricular response  Radiology    DG Chest Portable 1 View  Result Date: 03/06/2020 CLINICAL DATA:  Status post CPR. EXAM: PORTABLE CHEST 1 VIEW COMPARISON:  Chest x-rays dated 10/14/2019 and 11/02/2018. FINDINGS: Stable cardiomegaly. Pacer pads overlie the LEFT heart shadow. LEFT chest wall pacemaker/ICD apparatus in place. Central pulmonary vascular congestion and mild bilateral interstitial edema. No pleural effusion or pneumothorax is seen. No osseous fracture or dislocation is identified. IMPRESSION: Cardiomegaly with central pulmonary vascular congestion and mild bilateral interstitial edema indicating CHF/volume overload. No pleural effusion or pneumothorax is seen. Electronically Signed   By: Bary Richard M.D.   On: 03/06/2020 15:05    Assessment & Plan    1.  Syncope              -Pacemaker/ICD interrogated per Dr. Lady Gary in the ED             -Episode of wide-complex probable ventricular tachycardia the etiology of his syncope.  This was not  recognized V. tach by his device.,                2.  History of ventricular tachycardia with ICD in place             -Probable ventricular tachycardia although wife recognizes of SVT.  3.  Atrial fibrillation              -Rate currently controlled             -Continue anticoagulation with Pradaxa   4.  Coronary artery disease s/p PCI             -  Syncopal episode likely secondary to VT, not ACS; no evidence of myocardial infarction.  Mildly elevated-continue clonidine secondary to demand.  As per above.  Okay for discharge from cardiac standpoint on mexiletine 200 mg mg 3 times daily, increasing his Toprol to 25 mg twice daily in addition to the remainder of his home medications.  He will follow up with [facility] next week  5.  Mitral valve disease s/p repair              -Valvuloplasty in 2016 at [facility], Iantha Fallen A. Eloyse Causey MD 03/07/2020, 8:09 AM  Pager: [phone]

## 2020-03-07 NOTE — Progress Notes (Signed)
HOSPITAL MEDICINE OVERNIGHT EVENT NOTE    Notified by nursing that patient has developed midsternal chest discomfort, sore in quality.  Sublingual nitroglycerin was administered with only modest improvement in chest discomfort.  Patient refusing any additional doses of nitroglycerin.  Serial troponins have been reviewed, they are increasing slightly from 17 earlier in the day, to 34 and now to 99.  Repeat EKG reveals no ST segment change.  Chart reviewed, chest discomfort may be musculoskeletal from administration of CPR in the past 24 hours.  We will attempt to milligrams IV morphine and continue to monitor closely on telemetry.  Continuing to cycle cardiac enzymes.  Marinda Elk  MD [facility]

## 2020-03-07 NOTE — ED Notes (Signed)
Pt refused second sublingual nitroglycerin.

## 2020-03-07 NOTE — Care Management Obs Status (Signed)
MEDICARE OBSERVATION STATUS NOTIFICATION   Patient Details  Name: Darius Miller MRN: [id number] Date of Birth: 1946-01-08   Medicare Observation Status Notification Given:  Yes    Joseph Art, LCSWA 03/07/2020, 10:36 AM

## 2020-03-07 NOTE — ED Notes (Signed)
Pt repositioned in bed, given pillow 

## 2020-03-07 NOTE — ED Notes (Addendum)
Spoke with Shalhoub MD regarding pt complaint of chest pain and increasing troponin levels. Give second dose of sublingual nitro and reassess and give third sublingual nitro if needed per MD. Repeat troponin level at 0500 ordered.

## 2020-04-09 ENCOUNTER — Encounter: Payer: Self-pay | Admitting: Urology

## 2020-04-09 ENCOUNTER — Ambulatory Visit
Admission: RE | Admit: 2020-04-09 | Discharge: 2020-04-09 | Disposition: A | Discharge status: Home / Self Care | Payer: Medicare HMO | Source: Ambulatory Visit | Attending: Urology | Admitting: Urology

## 2020-04-09 ENCOUNTER — Other Ambulatory Visit: Payer: Self-pay

## 2020-04-09 ENCOUNTER — Ambulatory Visit (INDEPENDENT_AMBULATORY_CARE_PROVIDER_SITE_OTHER): Payer: Medicare HMO | Admitting: Urology

## 2020-04-09 ENCOUNTER — Ambulatory Visit
Admission: RE | Admit: 2020-04-09 | Discharge: 2020-04-09 | Disposition: A | Discharge status: Home / Self Care | Payer: Medicare HMO | Attending: Urology | Admitting: Urology

## 2020-04-09 VITALS — BP 119/79 | HR 72 | Ht 71.0 in | Wt 166.6 lb

## 2020-04-09 DIAGNOSIS — R109 Unspecified abdominal pain: Secondary | ICD-10-CM | POA: Diagnosis not present

## 2020-04-09 DIAGNOSIS — R310 Gross hematuria: Secondary | ICD-10-CM

## 2020-04-09 DIAGNOSIS — N200 Calculus of kidney: Secondary | ICD-10-CM | POA: Insufficient documentation

## 2020-04-09 MED ORDER — HYDROCODONE-ACETAMINOPHEN 5-325 MG PO TABS: 1.0000 | ORAL_TABLET | Freq: Four times a day (QID) | ORAL | 0 refills | Status: DC | PRN

## 2020-04-09 NOTE — Progress Notes (Addendum)
04/09/2020 1:10 PM   Darius Miller 03-07-1946 [id number]  Referring provider: Gracelyn Nurse, MD [address] [place],  [place] [address]  Chief Complaint  Patient presents with  . Nephrolithiasis  . Hematuria   Urological history: 1. High risk hematuria - former smoker - previously been followed by urology in [place] and underwent cystoscopy in 2016 which showed a small median lobe of the prostate and no bladder mucosal abnormalities.  CT in 2016 did show bilateral renal calculi - CTU 10/25/2019 bilateral renal calculi are again seen measuring up to 13 mm. A 10 mm calculus is seen in the left renal pelvis. No evidence of ureteral calculi or hydronephrosis. Small renal cysts are again seen bilaterally, however there is no evidence of renal masses. No masses are seen involving the ureters or urinary bladder. Diffuse bladder wall thickening is seen, likely due to chronic bladder outlet obstruction given enlarged prostate - cysto 11/2019 moderately enlarged prostate with hypervascularity.  Mild elevation bladder neck - UA > 30 RBC's   2. Nephrolithiasis - underwent an open pyelolithotomy in 2011 for a 3-4 cm left renal calculus after failed PCNL - CTU 10/2019 bilateral renal calculi are again seen measuring up to 13 mm. A 10 mm calculus is seen in the left renal pelvis   HPI: Darius Miller is a 75 y.o. male with a recent hospitalization for syncopal event when his current ICD did not recognize a series of V. tach and ICD replacement who presents with blood in the urine and back pain.  He states he has been having episodes of gross hematuria in the morning associated with left mid back pain that started 2 days ago.  The pain radiates into the left waist into the left groin.  8 out of 10 pain which responded to wife's Vicodin.  Patient denies any modifying or aggravating factors.  Patient denies any dysuria or suprapubic/flank pain.  Patient denies any fevers, chills, nausea  or vomiting.   UA > 30 RBC's.  KUB bilateral nephrolithiasis   PMH: Past Medical History:  Diagnosis Date  . Atrial fibrillation (HCC)   . Coronary artery disease  s/p RCA stents   . History of mitral valve repair   . Hypertension   . 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 Kidney stones   . Stroke (HCC)   . Ventricular tachycardia [facility])     Surgical History: Past Surgical History:  Procedure Laterality Date  . CARDIAC CATHETERIZATION    . CORONARY ANGIOPLASTY    . Coronary stent 2009  2009  . ICD IMPLANT    . MITRAL VALVE REPAIR  October 16, 2014    Home Medications:  Allergies as of 04/09/2020      Reactions   Flomax [tamsulosin Hcl]       Medication List       Accurate as of April 09, 2020 11:59 PM. If you have any questions, ask your nurse or doctor.        STOP taking these medications   acetaminophen 500 MG tablet Commonly known as: TYLENOL Stopped by: Michiel Cowboy, PA-C     TAKE these medications   aspirin 81 MG tablet Take 1 tablet (81 mg total) by mouth daily.   atorvastatin 20 MG tablet Commonly known as: LIPITOR Take 20 mg by mouth daily.   CVS D3 50 MCG (2000 UT) Caps Generic drug: Cholecalciferol Take 2,000 Units by mouth daily.   dabigatran  150 MG Caps capsule Commonly known as: PRADAXA Take 1 capsule (150 mg total) by mouth 2 (two) times daily.   finasteride 5 MG tablet Commonly known as: PROSCAR Take 1 tablet (5 mg total) by mouth daily. Started by: Michiel Cowboy, PA-C   fludrocortisone 0.1 MG tablet Commonly known as: FLORINEF Take 100 mcg by mouth daily.   furosemide 20 MG tablet Commonly known as: LASIX Take 20 mg by mouth daily.   HYDROcodone-acetaminophen 5-325 MG tablet Commonly known as: NORCO/VICODIN Take 1 tablet by mouth every 6 (six) hours as needed for moderate pain. Started by: Michiel Cowboy, PA-C   ipratropium 0.03 % nasal spray Commonly known as:  ATROVENT Place 1-2 sprays into both nostrils 2 (two) times daily as needed for rhinitis.   metoprolol succinate 25 MG 24 hr tablet Commonly known as: TOPROL-XL Take 1 tablet (25 mg total) by mouth 2 (two) times daily. This is an increase from 12.5 mg twice daily.   mexiletine 200 MG capsule Commonly known as: MEXITIL Take 1 capsule (200 mg total) by mouth 3 (three) times daily. This is an increase from 2 times daily.   nitroGLYCERIN 0.4 MG SL tablet Commonly known as: NITROSTAT Place 1 tablet (0.4 mg total) under the tongue every 5 (five) minutes as needed for chest pain.   Potassium Chloride ER 20 MEQ Tbcr Take 40 mEq by mouth daily.   Trelegy Ellipta 100-62.5-25 MCG/INH Aepb Generic drug: Fluticasone-Umeclidin-Vilant Inhale 1 puff into the lungs daily as needed (respiratory problems).   venlafaxine XR 75 MG 24 hr capsule Commonly known as: EFFEXOR-XR Take 75 mg by mouth daily.   vitamin B-12 1000 MCG tablet Commonly known as: CYANOCOBALAMIN Take 1,000 mcg by mouth daily.       Allergies:  Allergies  Allergen Reactions  . Flomax [Tamsulosin Hcl]     Family History: History reviewed. No pertinent family history.  Social History:  reports that he has quit smoking. His smoking use included cigarettes. He has a 40.00 pack-year smoking history. He has never used smokeless tobacco. He reports previous alcohol use. He reports that he does not use drugs.  ROS: Pertinent ROS in HPI  Physical Exam: BP 119/79   Pulse 72   Ht 5\' 11"  (1.803 m)   Wt 166 lb 9.6 oz (75.6 kg)   BMI 23.24 kg/m   Constitutional:  Well nourished. Alert and oriented, No acute distress. HEENT: [place] AT, mask in place.  Trachea midline Cardiovascular: No clubbing, cyanosis, or edema. Respiratory: Normal respiratory effort, no increased work of breathing. Neurologic: Grossly intact, no focal deficits, moving all 4 extremities. Psychiatric: Normal mood and affect.  Laboratory Data: Lab Results   Component Value Date   WBC 6.6 03/06/2020   HGB 12.5 (L) 03/06/2020   HCT 39.5 03/06/2020   MCV 94.7 03/06/2020   PLT 182 03/06/2020    Lab Results  Component Value Date   CREATININE 0.85 03/07/2020    Lab Results  Component Value Date   TSH 0.693 03/06/2020       Component Value Date/Time   CHOL 216 (H) 06/04/2017 0710   HDL 24 (L) 06/04/2017 0710   CHOLHDL 9.0 06/04/2017 0710   VLDL 26 06/04/2017 0710   LDLCALC 166 (H) 06/04/2017 0710    Lab Results  Component Value Date   AST 14 (L) 03/07/2020   Lab Results  Component Value Date   ALT 9 03/07/2020    Urinalysis Component     Latest Ref Rng & Units  04/09/2020  Specific Gravity, UA     1.005 - 1.030 1.020  pH, UA     5.0 - 7.5 6.0  Color, UA     Yellow Orange  Appearance Ur     Clear Cloudy (A)  Leukocytes,UA     Negative Trace (A)  Protein,UA     Negative/Trace Trace (A)  Glucose, UA     Negative Negative  Ketones, UA     Negative Negative  RBC, UA     Negative 3+ (A)  Bilirubin, UA     Negative Negative  Urobilinogen, Ur     0.2 - 1.0 mg/dL 0.2  Nitrite, UA     Negative Negative  Microscopic Examination      See below:   Component     Latest Ref Rng & Units 04/09/2020  WBC, UA     0 - 5 /hpf 0-5  RBC     0 - 2 /hpf >30 (A)  Epithelial Cells (non renal)     0 - 10 /hpf None seen  Bacteria, UA      WILL FOLLOW    I have reviewed the labs.   Pertinent Imaging: CLINICAL DATA:  Kidney stone.  EXAM: ABDOMEN - 1 VIEW  COMPARISON:  CT 10/25/2019.  FINDINGS: Surgical clips noted over the pelvis. Rounded radiodensities noted the left upper quadrant, possibly pill fragments. Prominent bilateral renal stones again noted. Renal stones measure up to 9 mm. Similar findings noted on prior CT. No definite ureteral stone noted. Stool noted throughout the colon making evaluation for renal stone disease difficult. Aortoiliac and peripheral vascular calcification. Pacing wires noted over  the lower chest. Bilateral calcified pleural plaques again noted.  IMPRESSION: 1. Prominent bilateral renal stones again noted. Similar findings noted on prior CT. No definite ureteral stone noted. Stool noted throughout the colon making evaluation for renal stone disease difficult.  2.  Calcified pleural plaques again noted.   Electronically Signed   By: Maisie Fus  Register   On: 04/10/2020 12:30 I have independently reviewed the films.    Assessment & Plan:    1. Nephrolithiasis - Abdomen 1 view (KUB); Future - Urinalysis, Complete - CULTURE, URINE COMPREHENSIVE  2. Gross hematuria - Urinalysis, Complete - CULTURE, URINE COMPREHENSIVE  3. Flank pain - STAT CT Renal stone study in the am tomorrow - Script given for Vicodin 5/325, # 10 prn for pain every 6 hours    Return for Tomorrow for CT scan report.  These notes generated with voice recognition software. I apologize for typographical errors.  Michiel Cowboy, PA-C  [facility] [address]  [address] [place], [place] [address] [phone]

## 2020-04-10 ENCOUNTER — Ambulatory Visit (INDEPENDENT_AMBULATORY_CARE_PROVIDER_SITE_OTHER): Payer: Medicare HMO | Admitting: Urology

## 2020-04-10 ENCOUNTER — Ambulatory Visit
Admission: RE | Admit: 2020-04-10 | Discharge: 2020-04-10 | Disposition: A | Discharge status: Home / Self Care | Payer: Medicare HMO | Source: Ambulatory Visit | Attending: Urology | Admitting: Urology

## 2020-04-10 VITALS — BP 123/84 | HR 82 | Ht 71.0 in | Wt 165.0 lb

## 2020-04-10 DIAGNOSIS — N200 Calculus of kidney: Secondary | ICD-10-CM | POA: Diagnosis not present

## 2020-04-10 DIAGNOSIS — R310 Gross hematuria: Secondary | ICD-10-CM | POA: Diagnosis not present

## 2020-04-10 DIAGNOSIS — R109 Unspecified abdominal pain: Secondary | ICD-10-CM | POA: Insufficient documentation

## 2020-04-10 MED ORDER — FINASTERIDE 5 MG PO TABS: 5.0000 mg | ORAL_TABLET | Freq: Every day | ORAL | 3 refills | Status: DC

## 2020-04-11 LAB — URINALYSIS, COMPLETE
Bilirubin, UA: NEGATIVE
Glucose, UA: NEGATIVE
Ketones, UA: NEGATIVE
Nitrite, UA: NEGATIVE
Specific Gravity, UA: 1.02 (ref 1.005–1.030)
Urobilinogen, Ur: 0.2 mg/dL (ref 0.2–1.0)
pH, UA: 6 (ref 5.0–7.5)

## 2020-04-11 LAB — MICROSCOPIC EXAMINATION
Bacteria, UA: NONE SEEN
Epithelial Cells (non renal): NONE SEEN /hpf (ref 0–10)
RBC, Urine: >30 /hpf — AB (ref 0–2)

## 2020-04-13 LAB — CULTURE, URINE COMPREHENSIVE

## 2020-04-15 ENCOUNTER — Encounter: Payer: Self-pay | Admitting: Urology

## 2020-04-15 ENCOUNTER — Telehealth: Payer: Self-pay | Admitting: Family Medicine

## 2020-04-15 NOTE — Telephone Encounter (Signed)
Patient advised of negative urine culture result.

## 2020-04-15 NOTE — Telephone Encounter (Signed)
-----   Message from Harle Battiest, PA-C sent at 04/14/2020  9:16 PM EST ----- Please let Mr. Penley know that his urine culture was negative.

## 2020-04-15 NOTE — Telephone Encounter (Signed)
LMOM informed patient of negative urine results.

## 2020-04-15 NOTE — Progress Notes (Signed)
04/10/2020 8:42 PM   Darius Miller September 11, 1945 [id number]  Referring provider: Gracelyn Nurse, MD [address] [place],  [place] [address]  No chief complaint on file.  Urological history: 1. High risk hematuria - former smoker - previously been followed by urology in [place] and underwent cystoscopy in 2016 which showed a small median lobe of the prostate and no bladder mucosal abnormalities.  CT in 2016 did show bilateral renal calculi - CTU 10/25/2019 bilateral renal calculi are again seen measuring up to 13 mm. A 10 mm calculus is seen in the left renal pelvis. No evidence of ureteral calculi or hydronephrosis. Small renal cysts are again seen bilaterally, however there is no evidence of renal masses. No masses are seen involving the ureters or urinary bladder. Diffuse bladder wall thickening is seen, likely due to chronic bladder outlet obstruction given enlarged prostate - cysto 11/2019 moderately enlarged prostate with hypervascularity.  Mild elevation bladder neck - UA > 30 RBC's   2. Nephrolithiasis - underwent an open pyelolithotomy in 2011 for a 3-4 cm left renal calculus after failed PCNL - CTU 10/2019 bilateral renal calculi are again seen measuring up to 13 mm. A 10 mm calculus is seen in the left renal pelvis   HPI: Darius Miller is a 75 y.o. male who presents today for CT scan report.    Patient underwent CT scan this morning for further evaluation of his gross hematuria associated with left mid back pain.  CT renal stone study did not did not find any hydronephrosis or ureteral stones.    He continues to have the left-sided pain.  The pain is located left lower back that radiates into the left hip area.    Patient denies any modifying or aggravating factors.  Patient denies any dysuria or suprapubic/flank pain.  Patient denies any fevers, chills, nausea or vomiting.   He denies any constipation or diarrhea.     PMH: Past Medical History:   Diagnosis Date  . Atrial fibrillation (HCC)   . Coronary artery disease  s/p RCA stents   . History of mitral valve repair   . Hypertension   . 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 Kidney stones   . Stroke (HCC)   . Ventricular tachycardia [facility])     Surgical History: Past Surgical History:  Procedure Laterality Date  . CARDIAC CATHETERIZATION    . CORONARY ANGIOPLASTY    . Coronary stent 2009  2009  . ICD IMPLANT    . MITRAL VALVE REPAIR  October 16, 2014    Home Medications:  Allergies as of 04/10/2020      Reactions   Flomax [tamsulosin Hcl]       Medication List       Accurate as of April 10, 2020 11:59 PM. If you have any questions, ask your nurse or doctor.        aspirin 81 MG tablet Take 1 tablet (81 mg total) by mouth daily.   atorvastatin 20 MG tablet Commonly known as: LIPITOR Take 20 mg by mouth daily.   CVS D3 50 MCG (2000 UT) Caps Generic drug: Cholecalciferol Take 2,000 Units by mouth daily.   dabigatran 150 MG Caps capsule Commonly known as: PRADAXA Take 1 capsule (150 mg total) by mouth 2 (two) times daily.   finasteride 5 MG tablet Commonly known as: PROSCAR Take 1 tablet (5 mg total) by mouth daily.   fludrocortisone 0.1 MG tablet  Commonly known as: FLORINEF Take 100 mcg by mouth daily.   furosemide 20 MG tablet Commonly known as: LASIX Take 20 mg by mouth daily.   HYDROcodone-acetaminophen 5-325 MG tablet Commonly known as: NORCO/VICODIN Take 1 tablet by mouth every 6 (six) hours as needed for moderate pain.   ipratropium 0.03 % nasal spray Commonly known as: ATROVENT Place 1-2 sprays into both nostrils 2 (two) times daily as needed for rhinitis.   metoprolol succinate 25 MG 24 hr tablet Commonly known as: TOPROL-XL Take 1 tablet (25 mg total) by mouth 2 (two) times daily. This is an increase from 12.5 mg twice daily.   mexiletine 200 MG capsule Commonly known as:  MEXITIL Take 1 capsule (200 mg total) by mouth 3 (three) times daily. This is an increase from 2 times daily.   nitroGLYCERIN 0.4 MG SL tablet Commonly known as: NITROSTAT Place 1 tablet (0.4 mg total) under the tongue every 5 (five) minutes as needed for chest pain.   Potassium Chloride ER 20 MEQ Tbcr Take 40 mEq by mouth daily.   Trelegy Ellipta 100-62.5-25 MCG/INH Aepb Generic drug: Fluticasone-Umeclidin-Vilant Inhale 1 puff into the lungs daily as needed (respiratory problems).   venlafaxine XR 75 MG 24 hr capsule Commonly known as: EFFEXOR-XR Take 75 mg by mouth daily.   vitamin B-12 1000 MCG tablet Commonly known as: CYANOCOBALAMIN Take 1,000 mcg by mouth daily.       Allergies:  Allergies  Allergen Reactions  . Flomax [Tamsulosin Hcl]     Family History: No family history on file.  Social History:  reports that he has quit smoking. His smoking use included cigarettes. He has a 40.00 pack-year smoking history. He has never used smokeless tobacco. He reports previous alcohol use. He reports that he does not use drugs.  ROS: Pertinent ROS in HPI  Physical Exam: BP 123/84   Pulse 82   Ht 5\' 11"  (1.803 m)   Wt 165 lb (74.8 kg)   BMI 23.01 kg/m   Constitutional:  Well nourished. Alert and oriented, No acute distress. HEENT: [place] AT, mask in place.  Trachea midline Cardiovascular: No clubbing, cyanosis, or edema. Respiratory: Normal respiratory effort, no increased work of breathing. Neurologic: Grossly intact, no focal deficits, moving all 4 extremities. Psychiatric: Normal mood and affect.  Laboratory Data: No new labs since yesterday's visit  Pertinent Imaging: CLINICAL DATA:  Central lower back pain for 4 days, gross hematuria.  EXAM: CT ABDOMEN AND PELVIS WITHOUT CONTRAST  TECHNIQUE: Multidetector CT imaging of the abdomen and pelvis was performed following the standard protocol without IV contrast.  COMPARISON:  10/25/2019.  FINDINGS: Lower  chest: Calcified pleural plaques are seen bilaterally. Lung bases are otherwise clear. Heart is enlarged. No pericardial or pleural effusion. Prepericardiac lymph node is not enlarged by CT size criteria.  Hepatobiliary: Liver and gallbladder are unremarkable. No biliary ductal dilatation.  Pancreas: Negative.  Spleen: Negative.  Adrenals/Urinary Tract: Right adrenal gland is unremarkable. Slight thickening of the left adrenal gland. Low-attenuation lesions in the kidneys measure up to 3.9 cm on the right and are likely cysts. Stones are seen in the kidneys bilaterally, measuring up to 12 mm on the left. Ureters are decompressed. There may be ventral bladder wall thickening.  Stomach/Bowel: Moderate hiatal hernia. Stomach, small bowel, appendix and majority of the colon are unremarkable. A knuckle of colon extends into a left lateral abdominal wall hernia.  Vascular/Lymphatic: Atherosclerotic calcification of the aorta. Infrarenal abdominal aorta measures up to 2.9 cm.  No pathologically enlarged lymph nodes.  Reproductive: Prostate is slightly enlarged.  Other: Right inguinal hernia repair. Right inguinal hernia contains fat. No free fluid. Mesenteries and peritoneum are otherwise unremarkable.  Musculoskeletal: Degenerative changes in the spine. No worrisome lytic or sclerotic lesions.  IMPRESSION: 1. Bilateral renal stones.  No obstruction. 2. 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 This recommendation follows ACR consensus guidelines: White Paper of the ACR Incidental Findings Committee II on Vascular Findings. J Am Coll Radiol 2013; 10:789-794. 3. Asbestos related pleural disease. 4. Moderate hiatal hernia.  Right inguinal hernia contains fat. 5. A knuckle of colon extends into a left lateral abdominal wall hernia. 6. Slightly enlarged prostate. There may be an element of bladder outlet obstruction. 7.  Aortic  atherosclerosis (ICD10-I70.0).   Electronically Signed   By: Leanna Battles M.D.   On: 04/10/2020 09:36 I have independently reviewed the films.    Assessment & Plan:    1. Nephrolithiasis -No obstructing stones are seen on today's CT renal stone study.  I explained that a stone would not be the cause of his left-sided pain.  I recommended that he follow-up with his primary care physician for further evaluation of the pain as it may be muscle skeletal and nature.  2. Gross hematuria -Urine culture is still pending at this time -I have suggested the patient start finasteride 5 mg daily as he was found to have a moderately enlarged prostate with hypervascularity and the gross hematuria may be prostatic in nature.   Return for Pending urine culture results.  These notes generated with voice recognition software. I apologize for typographical errors.  Michiel Cowboy, PA-C  [facility] [address]  [address] [place], [place] [address] [phone]

## 2020-04-16 ENCOUNTER — Other Ambulatory Visit: Payer: Self-pay

## 2020-04-16 ENCOUNTER — Encounter: Payer: Self-pay | Admitting: Urology

## 2020-04-16 ENCOUNTER — Ambulatory Visit: Payer: Medicare HMO | Admitting: Urology

## 2020-04-16 ENCOUNTER — Other Ambulatory Visit: Payer: Self-pay | Admitting: Urology

## 2020-04-16 VITALS — BP 120/80 | HR 81 | Ht 71.0 in | Wt 167.0 lb

## 2020-04-16 DIAGNOSIS — G8929 Other chronic pain: Secondary | ICD-10-CM | POA: Diagnosis not present

## 2020-04-16 DIAGNOSIS — R310 Gross hematuria: Secondary | ICD-10-CM

## 2020-04-16 DIAGNOSIS — M5442 Lumbago with sciatica, left side: Secondary | ICD-10-CM | POA: Diagnosis not present

## 2020-04-16 LAB — MICROSCOPIC EXAMINATION: RBC, Urine: >30 /hpf — AB (ref 0–2)

## 2020-04-16 LAB — BLADDER SCAN AMB NON-IMAGING: Scan Result: 63

## 2020-04-16 LAB — URINALYSIS, COMPLETE
Bilirubin, UA: NEGATIVE
Glucose, UA: NEGATIVE
Ketones, UA: NEGATIVE
Nitrite, UA: NEGATIVE
Protein,UA: NEGATIVE
Specific Gravity, UA: 1.02 (ref 1.005–1.030)
Urobilinogen, Ur: 0.2 mg/dL (ref 0.2–1.0)
pH, UA: 7 (ref 5.0–7.5)

## 2020-04-16 NOTE — Progress Notes (Signed)
04/16/2020 2:31 PM   Darius Miller 1945/10/01 [id number]  Referring provider: Gracelyn Nurse, MD [address] [place],  [place] [address]  Chief Complaint  Patient presents with  . Hematuria    HPI: 75 y.o. male presents for follow-up of recurrent gross hematuria.  Prior CT with nonobstructing renal calculi and cystoscopy 11/2019 with BPH and hypervascularity.  No bladder mucosal lesions identified.  He has had several visits with [facility] complaining of recurrent hematuria and left low back pain which starts in the left low back at the waist and radiates into the buttock.  Recently started on finasteride and does feel his hematuria has lessened.  He is on chronic anticoagulant therapy.   PMH: Past Medical History:  Diagnosis Date  . Atrial fibrillation (HCC)   . Coronary artery disease  s/p RCA stents   . History of mitral valve repair   . Hypertension   . 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 Kidney stones   . Stroke (HCC)   . Ventricular tachycardia [facility])     Surgical History: Past Surgical History:  Procedure Laterality Date  . CARDIAC CATHETERIZATION    . CORONARY ANGIOPLASTY    . Coronary stent 2009  2009  . ICD IMPLANT    . MITRAL VALVE REPAIR  October 16, 2014    Home Medications:  Allergies as of 04/16/2020      Reactions   Flomax [tamsulosin Hcl]       Medication List       Accurate as of April 16, 2020  2:31 PM. If you have any questions, ask your nurse or doctor.        aspirin 81 MG tablet Take 1 tablet (81 mg total) by mouth daily.   atorvastatin 20 MG tablet Commonly known as: LIPITOR Take 20 mg by mouth daily.   CVS D3 50 MCG (2000 UT) Caps Generic drug: Cholecalciferol Take 2,000 Units by mouth daily.   dabigatran 150 MG Caps capsule Commonly known as: PRADAXA Take 1 capsule (150 mg total) by mouth 2 (two) times daily.   finasteride 5 MG tablet Commonly known as:  PROSCAR Take 1 tablet (5 mg total) by mouth daily.   fludrocortisone 0.1 MG tablet Commonly known as: FLORINEF Take 100 mcg by mouth daily.   furosemide 20 MG tablet Commonly known as: LASIX Take 20 mg by mouth daily.   HYDROcodone-acetaminophen 5-325 MG tablet Commonly known as: NORCO/VICODIN Take 1 tablet by mouth every 6 (six) hours as needed for moderate pain.   ipratropium 0.03 % nasal spray Commonly known as: ATROVENT Place 1-2 sprays into both nostrils 2 (two) times daily as needed for rhinitis.   metoprolol succinate 25 MG 24 hr tablet Commonly known as: TOPROL-XL Take 1 tablet (25 mg total) by mouth 2 (two) times daily. This is an increase from 12.5 mg twice daily.   mexiletine 200 MG capsule Commonly known as: MEXITIL Take 1 capsule (200 mg total) by mouth 3 (three) times daily. This is an increase from 2 times daily.   nitroGLYCERIN 0.4 MG SL tablet Commonly known as: NITROSTAT Place 1 tablet (0.4 mg total) under the tongue every 5 (five) minutes as needed for chest pain.   Potassium Chloride ER 20 MEQ Tbcr Take 40 mEq by mouth daily.   Trelegy Ellipta 100-62.5-25 MCG/INH Aepb Generic drug: Fluticasone-Umeclidin-Vilant Inhale 1 puff into the lungs daily as needed (respiratory problems).   venlafaxine XR 75 MG 24 hr  capsule Commonly known as: EFFEXOR-XR Take 75 mg by mouth daily.   vitamin B-12 1000 MCG tablet Commonly known as: CYANOCOBALAMIN Take 1,000 mcg by mouth daily.       Allergies:  Allergies  Allergen Reactions  . Flomax [Tamsulosin Hcl]     Family History: No family history on file.  Social History:  reports that he has quit smoking. His smoking use included cigarettes. He has a 40.00 pack-year smoking history. He has never used smokeless tobacco. He reports previous alcohol use. He reports that he does not use drugs.   Physical Exam: BP 120/80   Pulse 81   Ht 5\' 11"  (1.803 m)   Wt 167 lb (75.8 kg)   BMI 23.29 kg/m    Constitutional:  Alert and oriented, No acute distress. HEENT: [place] AT, moist mucus membranes.  Trachea midline, no masses. Cardiovascular: No clubbing, cyanosis, or edema. Respiratory: Normal respiratory effort, no increased work of breathing. Skin: No rashes, bruises or suspicious lesions. Neurologic: Grossly intact, no focal deficits, moving all 4 extremities. Psychiatric: Normal mood and affect.   Assessment & Plan:    1.  Recurrent hematuria  The only significant finding on evaluation was BPH with hypervascularity  Urinary calculi unlikely a source of his gross hematuria  Recommend he continue finasteride and follow-up in 1 month.  For continued gross hematuria we did discuss TURP though he does have significant medical comorbidities.  2.  Left back pain  We discussed his nonobstructing left renal calculus would be unlikely because of pain and his pain is primary located at the waist and radiating into the buttock and is consistent with musculoskeletal pain  He states he recently saw Dr. and an MRI has been performed.  They are awaiting the results.  I spent 30 total minutes on the day of the encounter including pre-visit review of the medical record, face-to-face time with the patient, and post visit ordering of labs/imaging/tests.   Letitia Libra, MD  [facility] [address], [address] [place], [place] [phone]

## 2020-04-18 ENCOUNTER — Encounter: Payer: Self-pay | Admitting: Urology

## 2020-05-15 ENCOUNTER — Other Ambulatory Visit: Payer: Self-pay

## 2020-05-15 ENCOUNTER — Ambulatory Visit: Payer: Medicare HMO | Admitting: Urology

## 2020-05-15 ENCOUNTER — Encounter: Payer: Self-pay | Admitting: Urology

## 2020-05-15 VITALS — BP 137/95 | HR 87 | Ht 71.0 in | Wt 170.0 lb

## 2020-05-15 DIAGNOSIS — R310 Gross hematuria: Secondary | ICD-10-CM | POA: Diagnosis not present

## 2020-05-15 LAB — BLADDER SCAN AMB NON-IMAGING: Scan Result: 31

## 2020-05-15 NOTE — Progress Notes (Signed)
05/15/2020 1:05 PM   Loralyn Freshwater April 20, 1945 [id number]  Referring provider: Gracelyn Nurse, MD [address] [place],  [place] [address]  Chief Complaint  Patient presents with  . Other    HPI: 75 y.o. male presents for follow-up.   Refer to my last note 04/16/2020  Does note decreased hematuria with the addition of finasteride  Back pain has resolved   PMH: Past Medical History:  Diagnosis Date  . Atrial fibrillation (HCC)   . Coronary artery disease  s/p RCA stents   . History of mitral valve repair   . Hypertension   . 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 Kidney stones   . Stroke (HCC)   . Ventricular tachycardia [facility])     Surgical History: Past Surgical History:  Procedure Laterality Date  . CARDIAC CATHETERIZATION    . CORONARY ANGIOPLASTY    . Coronary stent 2009  2009  . ICD IMPLANT    . MITRAL VALVE REPAIR  October 16, 2014    Home Medications:  Allergies as of 05/15/2020      Reactions   Flomax [tamsulosin Hcl]       Medication List       Accurate as of May 15, 2020  1:05 PM. If you have any questions, ask your nurse or doctor.        aspirin 81 MG tablet Take 1 tablet (81 mg total) by mouth daily.   atorvastatin 20 MG tablet Commonly known as: LIPITOR Take 20 mg by mouth daily.   CVS D3 50 MCG (2000 UT) Caps Generic drug: Cholecalciferol Take 2,000 Units by mouth daily.   dabigatran 150 MG Caps capsule Commonly known as: PRADAXA Take 1 capsule (150 mg total) by mouth 2 (two) times daily.   finasteride 5 MG tablet Commonly known as: PROSCAR Take 1 tablet (5 mg total) by mouth daily.   fludrocortisone 0.1 MG tablet Commonly known as: FLORINEF Take 100 mcg by mouth daily.   furosemide 20 MG tablet Commonly known as: LASIX Take 20 mg by mouth daily.   HYDROcodone-acetaminophen 5-325 MG tablet Commonly known as: NORCO/VICODIN Take 1 tablet by mouth every 6 (six)  hours as needed for moderate pain.   ipratropium 0.03 % nasal spray Commonly known as: ATROVENT Place 1-2 sprays into both nostrils 2 (two) times daily as needed for rhinitis.   metoprolol succinate 25 MG 24 hr tablet Commonly known as: TOPROL-XL Take 1 tablet (25 mg total) by mouth 2 (two) times daily. This is an increase from 12.5 mg twice daily.   mexiletine 200 MG capsule Commonly known as: MEXITIL Take 1 capsule (200 mg total) by mouth 3 (three) times daily. This is an increase from 2 times daily.   nitroGLYCERIN 0.4 MG SL tablet Commonly known as: NITROSTAT Place 1 tablet (0.4 mg total) under the tongue every 5 (five) minutes as needed for chest pain.   Potassium Chloride ER 20 MEQ Tbcr Take 40 mEq by mouth daily.   Trelegy Ellipta 100-62.5-25 MCG/INH Aepb Generic drug: Fluticasone-Umeclidin-Vilant Inhale 1 puff into the lungs daily as needed (respiratory problems).   venlafaxine XR 75 MG 24 hr capsule Commonly known as: EFFEXOR-XR Take 75 mg by mouth daily.   vitamin B-12 1000 MCG tablet Commonly known as: CYANOCOBALAMIN Take 1,000 mcg by mouth daily.       Allergies:  Allergies  Allergen Reactions  . Flomax [Tamsulosin Hcl]     Family History: No  family history on file.  Social History:  reports that he has quit smoking. His smoking use included cigarettes. He has a 40.00 pack-year smoking history. He has never used smokeless tobacco. He reports previous alcohol use. He reports that he does not use drugs.   Physical Exam: There were no vitals taken for this visit.  Constitutional:  Alert and oriented, No acute distress. HEENT: [place] AT, moist mucus membranes.  Trachea midline, no masses. Cardiovascular: No clubbing, cyanosis, or edema. Respiratory: Normal respiratory effort, no increased work of breathing.   Assessment & Plan:    1. Gross hematuria  Prior cystoscopy felt consistent with prostatic bleeding  Hematuria has decreased on  finasteride  Instructed to call in 1 month if hematuria is still present and will repeat cystoscopy  Follow-up visit 6 months  Bladder scan PVR 31 mL   Riki Altes, MD  [facility] [address] [place], [place] [address] [phone]

## 2020-07-25 ENCOUNTER — Other Ambulatory Visit: Payer: Self-pay | Admitting: Pulmonary Disease

## 2020-07-25 DIAGNOSIS — J9611 Chronic respiratory failure with hypoxia: Secondary | ICD-10-CM

## 2020-07-26 ENCOUNTER — Ambulatory Visit: Payer: Medicare HMO

## 2020-08-10 IMAGING — CT CT ABDOMEN AND PELVIS WITHOUT AND WITH CONTRAST
2 of 12 series · 9 of 46 positions shown, 15 images · IV contrast (omnipaque)
Comparison: 02/05/2018 renal sonogram.

CLINICAL DATA: Hematuria.  Abdominal distension.

EXAM:
CT ABDOMEN AND PELVIS WITHOUT AND WITH CONTRAST
TECHNIQUE: Multidetector CT imaging of the abdomen and pelvis was performed
following the standard protocol before and following the bolus
administration of intravenous contrast.
CONTRAST:  125mL OMNIPAQUE IOHEXOL 300 MG/ML  SOLN

[Series 2: axial pre · axial · non-contrast · 0.72mm/px · z∈[+1167,+1527]mm · 7 of 97 slices shown, 12 images]
[im 13/97  soft-tissue]
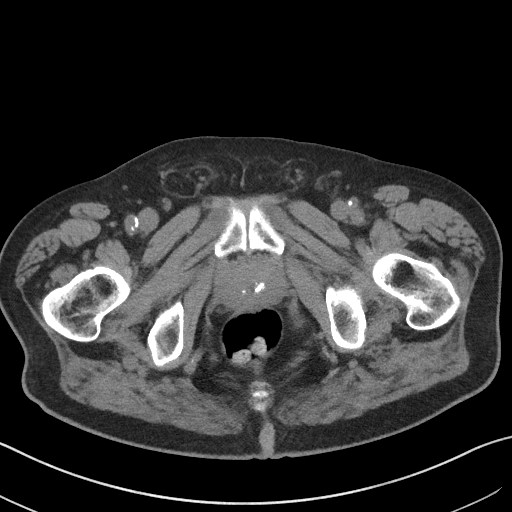
[im 13/97  bone]
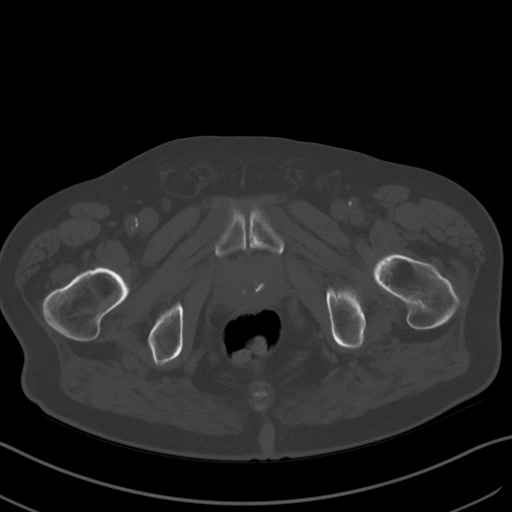
[im 25/97  soft-tissue]
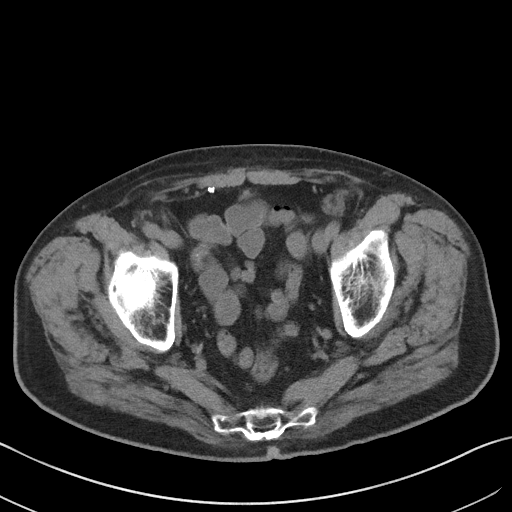
[im 37/97  soft-tissue]
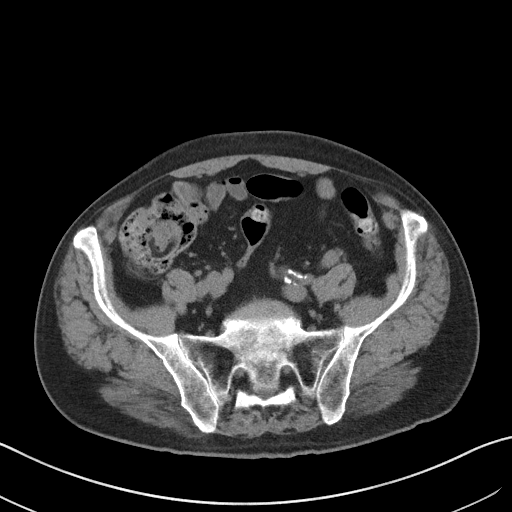
[im 49/97  soft-tissue]
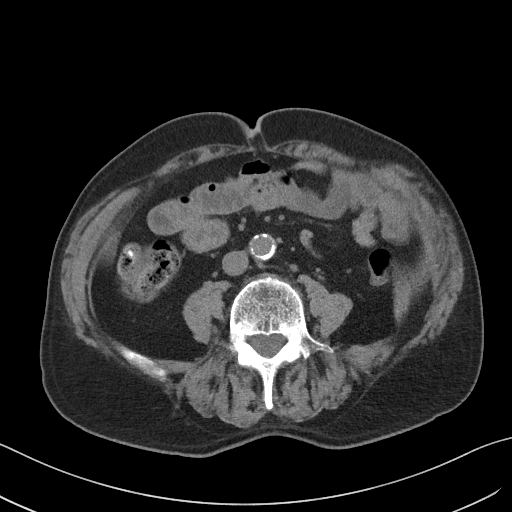
[im 49/97  lung]
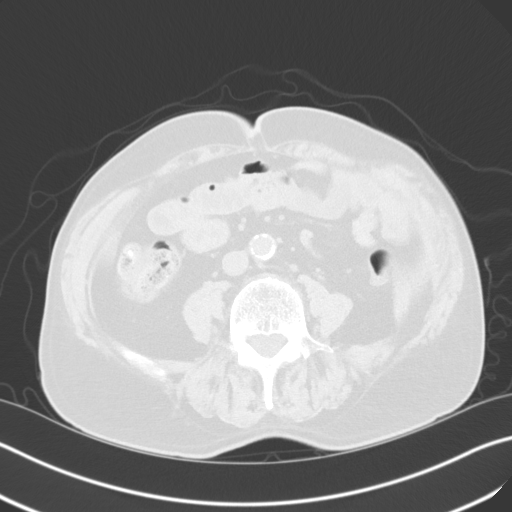
[im 61/97  soft-tissue]
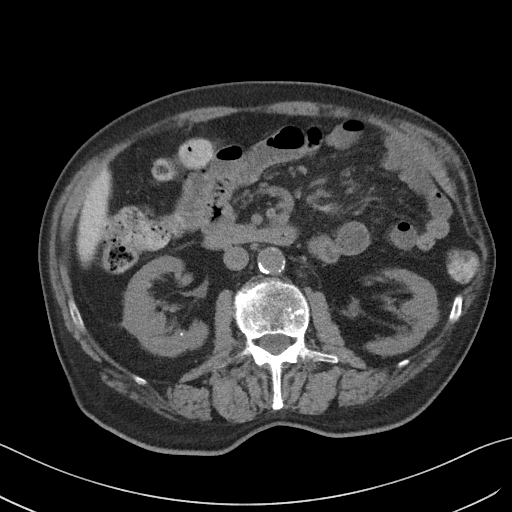
[im 61/97  lung]
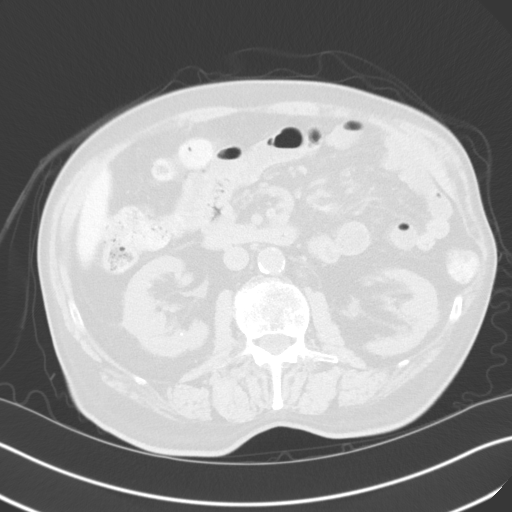
[im 73/97  soft-tissue]
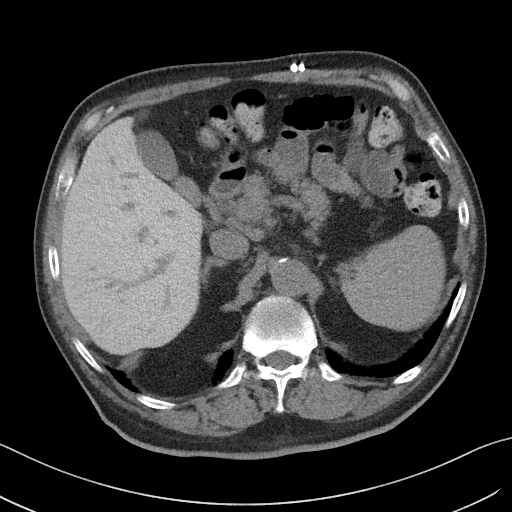
[im 73/97  lung]
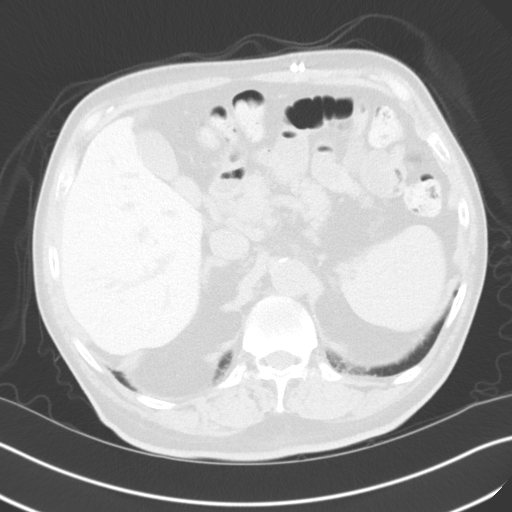
[im 85/97  soft-tissue]
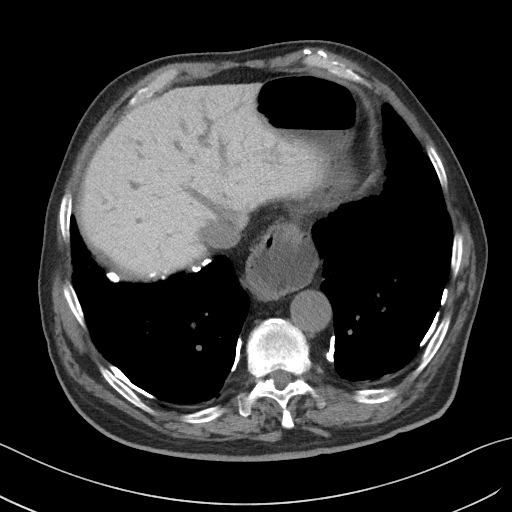
[im 85/97  lung]
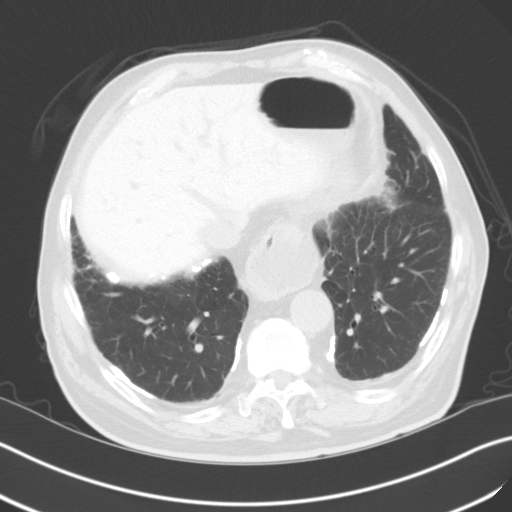

[Series 7: coronal pre · coronal · non-contrast · 0.77mm/px · 2 of 108 slices shown, 3 images]
[im 36/108  soft-tissue]
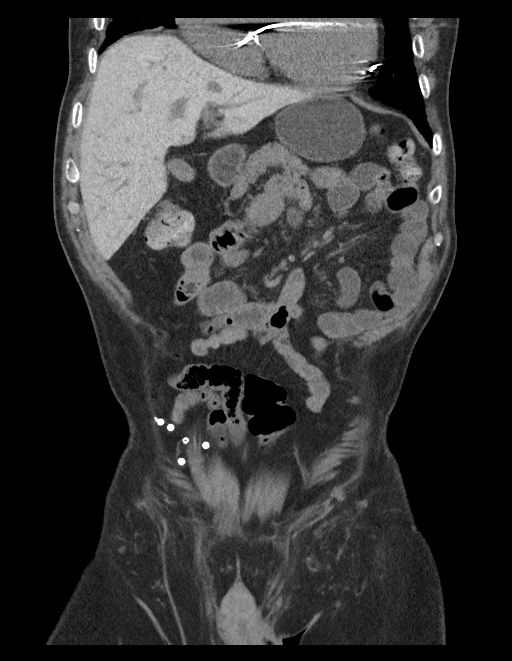
[im 36/108  bone]
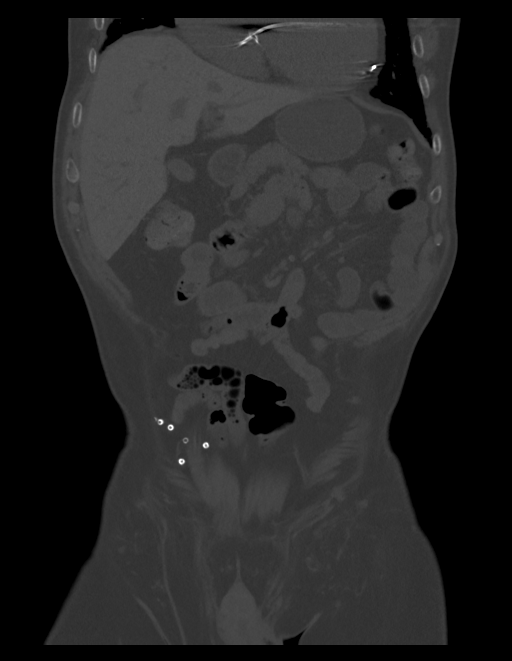
[im 72/108  soft-tissue]
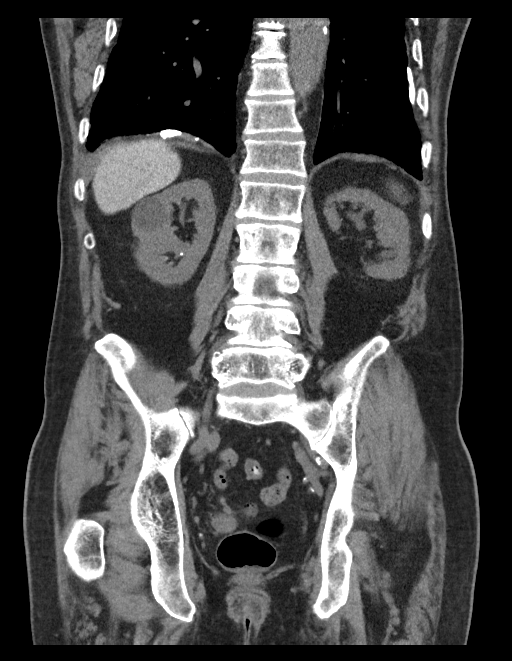

[9 of 46 positions shown; findings below may reference images not displayed]

FINDINGS: Lower chest: Extensive calcified pleural plaques throughout both
lung bases. Cardiomegaly. ICD leads are seen terminating in the
right ventricular apex and coronary sinus. Coronary atherosclerosis.

Hepatobiliary: Uniformly hyperdense liver parenchyma. Normal liver
size. No definite liver surface. No liver masses. Normal gallbladder
with no radiopaque cholelithiasis. No biliary ductal dilatation.

Pancreas: Normal, with no mass or duct dilation.

Spleen: Normal size. No mass.

Adrenals/Urinary Tract: Nonspecific mild thickening of both adrenal
glands without discrete adrenal nodules. Coarse calcification within
the left adrenal gland is compatible with nonspecific remote insult.
At least 4 nonobstructing right renal stones, largest 13 mm in the
interpolar right kidney with overlying renal cortical scarring 2
nonobstructing lower left renal stones, largest 7 mm. No
hydronephrosis. Normal caliber ureters, with no ureteral stones.
Simple 3.7 cm upper right renal cyst. Small simple lower left renal
cysts, largest 1.7 cm. Numerous subcentimeter hypodense renal
cortical lesions scattered in both kidneys are too small to
characterize and require no follow-up. On delayed imaging, there is
no urothelial wall thickening and there are no filling defects in
the opacified portions of the bilateral collecting systems or
ureters, noting limited evaluation of the pelvic segment the right
ureter due to non opacification. No bladder stones, masses or
diverticula. Mass-effect on the bladder base by the enlarged median
lobe of the prostate. Diffuse bladder wall thickening. No
significant bladder distention.

Stomach/Bowel: Moderate hiatal hernia. Otherwise normal stomach.
Normal caliber small bowel with no small bowel wall thickening.
Normal appendix. Minimal sigmoid diverticulosis, with no large bowel
wall thickening or significant pericolonic fat stranding.

Vascular/Lymphatic: Atherosclerotic abdominal aorta with 3.0 cm
infrarenal abdominal aortic aneurysm. Patent portal, splenic,
hepatic and renal veins. No pathologically enlarged lymph nodes in
the abdomen or pelvis.

Reproductive: Moderately enlarged prostate with nonspecific internal
prostatic calcifications.

Other: No pneumoperitoneum. No ascites. There is a small 3.2 x 1.4 x
2.8 cm fluid collection in the anterior left pelvis deep to the
internal left inguinal ring with tiny eccentric calcification
(series 2/image 70), presumably chronic seroma or chronic hematoma.
Status post right inguinal hernia mesh repair. Asymmetric fat in the
right inguinal canal without definite recurrent hernia.

Musculoskeletal: No aggressive appearing focal osseous lesions.
Moderate lumbar spondylosis.
IMPRESSION: 1. Nonobstructing bilateral nephrolithiasis. No hydronephrosis. No
ureteral or bladder stones.
2. Simple bilateral renal cysts. No suspicious renal cortical
masses. No evidence of urothelial lesions, with limitations as
described.
3. Moderately enlarged prostate with mass-effect on the bladder base
by the enlarged median lobe of the prostate.
4. Nonspecific diffuse bladder wall thickening, probably due to
chronic bladder outlet obstruction.
5. Extensive calcified pleural plaques at both lung bases,
compatible with asbestos related pleural disease.
6. Uniformly hyperdense liver parenchyma, nonspecific, most commonly
due to amiodarone therapy.
7. Cardiomegaly.
8. Moderate hiatal hernia.
9. Minimal sigmoid diverticulosis.
10. Infrarenal 3.0 cm Abdominal Aortic Aneurysm (DZE1N-N4H.9).
Recommend follow-up aortic ultrasound in 3 years. This
recommendation follows ACR consensus guidelines: White Paper of the
ACR Incidental Findings Committee II on Vascular Findings. [HOSPITAL] 3097; [DATE].
11.  Aortic Atherosclerosis (DZE1N-P4Q.Q).

## 2020-10-19 IMAGING — DX CHEST  1 VIEW
1 series · 1 of 1 positions shown · non-contrast
Comparison: CTA chest same day

CLINICAL DATA: Shortness of breath

EXAM:
CHEST  1 VIEW

[chest ap]
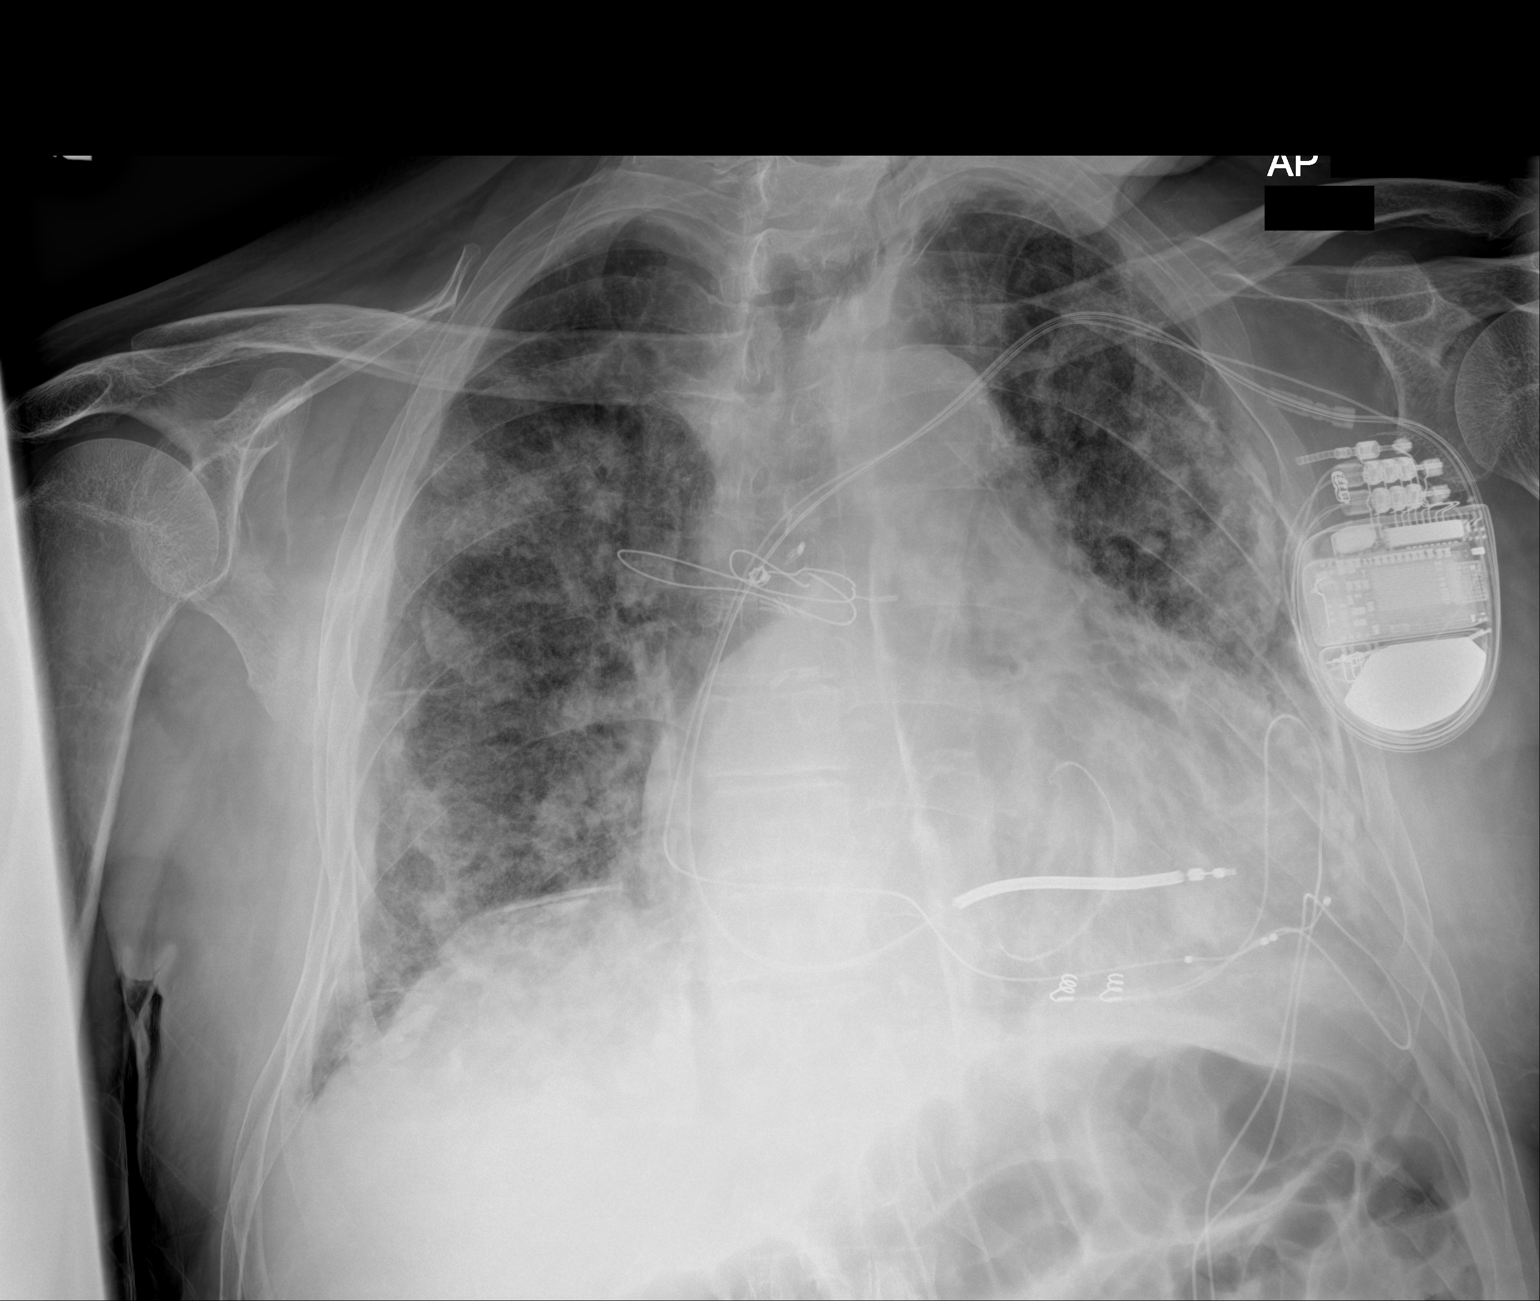

[1 of 1 positions shown; findings below may reference images not displayed]

FINDINGS: Redemonstration of multifocal areas of airspace disease throughout
both lungs. There is diffuse pleural thickening with calcified
pleural plaques in the bases and medially. No pneumothorax. Suspect
trace effusions.

Pacer/defibrillator battery pack in the soft tissues of the left
chest wall with leads in the right atrium, cardiac apex and coronary
sinus. Sternotomy wires noted in the mid chest. Additional pacer
leads course towards the abdomen.
IMPRESSION: Multifocal areas of airspace disease throughout both lungs, either
multifocal pneumonia or diffuse alveolar edema.

Calcified pleural plaques

Cardiomegaly.

Trace bilateral effusions.

## 2020-10-23 IMAGING — DX PORTABLE CHEST - 1 VIEW
1 series · 1 of 1 positions shown · non-contrast
Comparison: 10/27/2018

CLINICAL DATA: Acute respiratory failure (HCC) 10/27/2018

EXAM:
PORTABLE CHEST 1 VIEW

[chest ap]
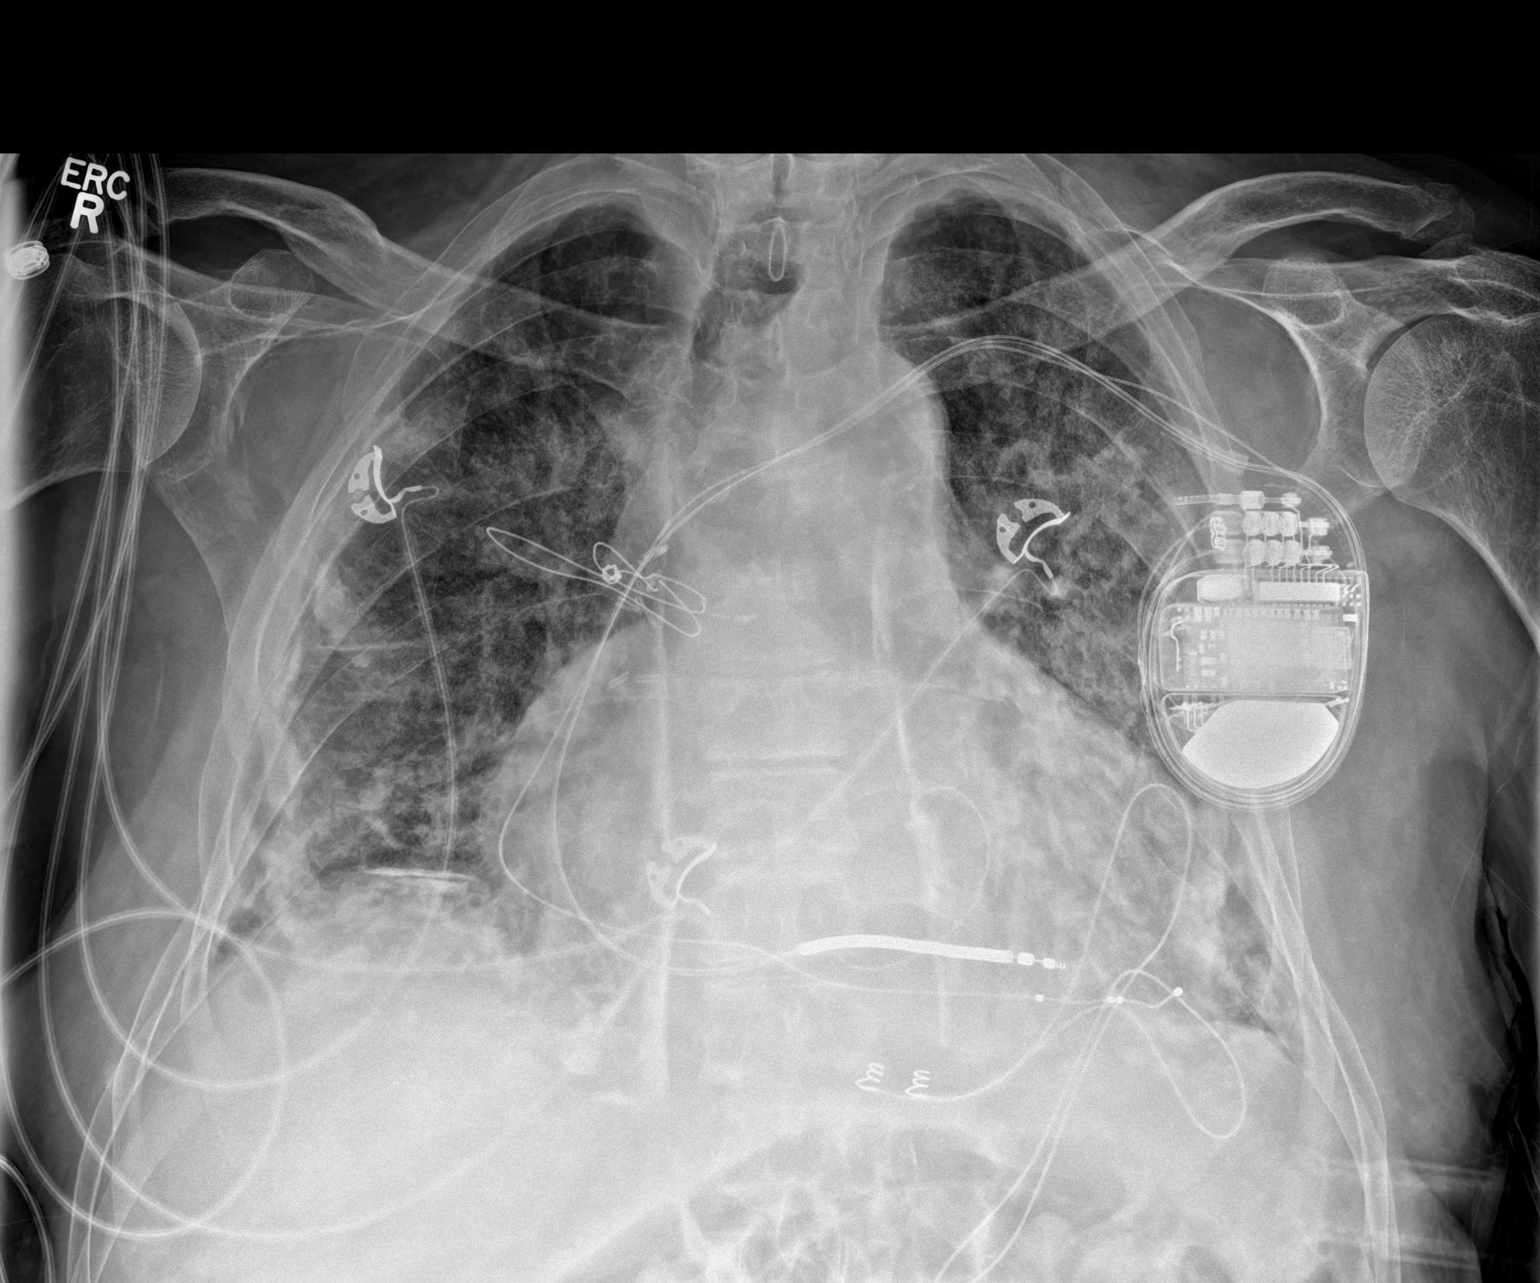

[1 of 1 positions shown; findings below may reference images not displayed]

FINDINGS: Patient has LEFT-sided AICD with leads to the coronary sinus and
RIGHT ventricle. The heart is enlarged and stable in configuration.
Stable calcified pleural plaques. There are patchy airspace filling
opacities throughout the lungs bilaterally, stable in appearance. No
pneumothorax.
IMPRESSION: Stable appearance of the multiple pulmonary opacities.

## 2020-11-15 ENCOUNTER — Encounter: Payer: Self-pay | Admitting: Urology

## 2020-11-15 ENCOUNTER — Other Ambulatory Visit: Payer: Self-pay

## 2020-11-15 ENCOUNTER — Ambulatory Visit: Payer: Medicare HMO | Admitting: Urology

## 2020-11-15 VITALS — BP 105/71 | HR 88 | Ht 71.0 in | Wt 175.0 lb

## 2020-11-15 DIAGNOSIS — N200 Calculus of kidney: Secondary | ICD-10-CM

## 2020-11-15 DIAGNOSIS — N401 Enlarged prostate with lower urinary tract symptoms: Secondary | ICD-10-CM

## 2020-11-15 LAB — BLADDER SCAN AMB NON-IMAGING: SCA Result: 87

## 2020-11-15 NOTE — Progress Notes (Signed)
11/15/2020 12:39 PM   Loralyn Freshwater 06-Apr-1945 [id number]  Referring provider: Gracelyn Nurse, MD [address] [place],  [place] [address]  Chief Complaint  Patient presents with   Other    Urologic history: 1.  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  No bladder mucosal lesions  2.  Nephrolithiasis Bilateral, nonobstructing renal calculi Open pyelolithotomy 2011 for a 3-4 cm left renal calculus after failed PCNL   HPI: 75 y.o. male presents for 73-month follow-up.  At last visit he was started on finasteride and hematuria had improved on 1 month follow-up He is no longer taking finasteride States his urine is brownish in the morning but clears as he hydrates Has not seen red/pink/burgundy colored urine   PMH: Past Medical History:  Diagnosis Date   Atrial fibrillation (HCC)    Coronary artery disease  s/p RCA stents    History of mitral valve repair    Hypertension    ICD (implantable cardioverter-defibrillator), biventricular, Medtronic NO  atrial lead    Kidney stones    Stroke [facility])    Ventricular tachycardia [facility])     Surgical History: Past Surgical History:  Procedure Laterality Date   CARDIAC CATHETERIZATION     CORONARY ANGIOPLASTY     Coronary stent 2009  2009   ICD IMPLANT     MITRAL VALVE REPAIR  October 16, 2014    Home Medications:  Allergies as of 11/15/2020       Reactions   Flomax [tamsulosin Hcl]         Medication List        Accurate as of November 15, 2020 12:39 PM. If you have any questions, ask your nurse or doctor.          aspirin 81 MG tablet Take 1 tablet (81 mg total) by mouth daily.   atorvastatin 20 MG tablet Commonly known as: LIPITOR Take 20 mg by mouth  daily.   CVS D3 50 MCG (2000 UT) Caps Generic drug: Cholecalciferol Take 2,000 Units by mouth daily.   dabigatran 150 MG Caps capsule Commonly known as: PRADAXA Take 1 capsule (150 mg total) by mouth 2 (two) times daily.   finasteride 5 MG tablet Commonly known as: PROSCAR Take 1 tablet (5 mg total) by mouth daily.   fludrocortisone 0.1 MG tablet Commonly known as: FLORINEF Take 100 mcg by mouth daily.   furosemide 20 MG tablet Commonly known as: LASIX Take 20 mg by mouth daily.   HYDROcodone-acetaminophen 5-325 MG tablet Commonly known as: NORCO/VICODIN Take 1 tablet by mouth every 6 (six) hours as needed for moderate pain.   ipratropium 0.03 % nasal spray Commonly known as: ATROVENT Place 1-2 sprays into both nostrils 2 (two) times daily as needed for rhinitis.   metoprolol succinate 25 MG 24 hr tablet Commonly known as: TOPROL-XL Take 1 tablet (25 mg total) by mouth 2 (two) times daily. This is an increase from 12.5 mg twice daily.   mexiletine 200 MG capsule Commonly known as: MEXITIL Take 1 capsule (200 mg total) by mouth 3 (three) times daily. This is an increase from 2 times daily.   nitroGLYCERIN 0.4 MG SL tablet Commonly known as: NITROSTAT Place 1 tablet (0.4 mg total) under the tongue every 5 (five) minutes as needed for chest pain.  Potassium Chloride ER 20 MEQ Tbcr Take 40 mEq by mouth daily.   Trelegy Ellipta 100-62.5-25 MCG/INH Aepb Generic drug: Fluticasone-Umeclidin-Vilant Inhale 1 puff into the lungs daily as needed (respiratory problems).   venlafaxine XR 75 MG 24 hr capsule Commonly known as: EFFEXOR-XR Take 75 mg by mouth daily.   vitamin B-12 1000 MCG tablet Commonly known as: CYANOCOBALAMIN Take 1,000 mcg by mouth daily.        Allergies:  Allergies  Allergen Reactions   Flomax [Tamsulosin Hcl]     Family History: No family history on file.  Social History:  reports that he has quit smoking. His smoking use included  cigarettes. He has a 40.00 pack-year smoking history. He has never used smokeless tobacco. He reports that he does not currently use alcohol. He reports that he does not use drugs.   Physical Exam: BP 105/71   Pulse 88   Ht 5\' 11"  (1.803 m)   Wt 175 lb (79.4 kg)   BMI 24.41 kg/m   Constitutional:  Alert and oriented, No acute distress. HEENT: [place] AT, moist mucus membranes.  Trachea midline, no masses. Cardiovascular: No clubbing, cyanosis, or edema. Respiratory: Normal respiratory effort, no increased work of breathing. Neurologic: Grossly intact, no focal deficits, moving all 4 extremities. Psychiatric: Normal mood and affect.   Assessment & Plan:    1.  BPH with LUTS Stable Bladder scan PVR 87 mL  2.  Hematuria Improved, brownish urine and a.m. may be more related to hydration  3.  Nephrolithiasis Asymptomatic  Follow-up 6 months or earlier for worsening voiding symptoms/recurrent gross hematuria   , MD  [facility] [address] [place], [place] [phone]

## 2020-11-18 ENCOUNTER — Encounter: Payer: Self-pay | Admitting: Urology

## 2021-05-16 ENCOUNTER — Ambulatory Visit
Admission: RE | Admit: 2021-05-16 | Discharge: 2021-05-16 | Disposition: A | Discharge status: Home / Self Care | Payer: Medicare HMO | Source: Ambulatory Visit | Attending: Urology | Admitting: Urology

## 2021-05-16 ENCOUNTER — Ambulatory Visit: Payer: Medicare HMO | Admitting: Urology

## 2021-05-16 ENCOUNTER — Other Ambulatory Visit: Payer: Self-pay

## 2021-05-16 VITALS — BP 126/79 | HR 84 | Ht 71.0 in | Wt 183.0 lb

## 2021-05-16 DIAGNOSIS — N200 Calculus of kidney: Secondary | ICD-10-CM

## 2021-05-16 DIAGNOSIS — N401 Enlarged prostate with lower urinary tract symptoms: Secondary | ICD-10-CM

## 2021-05-16 DIAGNOSIS — R310 Gross hematuria: Secondary | ICD-10-CM

## 2021-05-16 LAB — BLADDER SCAN AMB NON-IMAGING: Scan Result: 0

## 2021-05-16 NOTE — Progress Notes (Signed)
? ?05/16/2021 ?12:09 PM  ? ?Darius Miller ?01/02/1946 ?[id number] ? ?Referring provider: Baxter Hire, MD ?[address] ?[place],  [place] [address] ? ?Chief Complaint  ?Patient presents with  ? Benign Prostatic Hypertrophy  ? ? ?Urologic history: ?1.  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  No bladder mucosal lesions ? ?2.  Nephrolithiasis ?Bilateral, nonobstructing renal calculi ?Open pyelolithotomy 2011 for a 3-4 cm left renal calculus after failed PCNL ? ? ?HPI: ?76 y.o. male presents for 61-month follow-up. ? ?Since last visit notes occasional hematuria ?Notes occasional dysuria and urinary frequency ?Occasional flank pain but no renal colic ?No longer taking finasteride ? ? ?PMH: ?Past Medical History:  ?Diagnosis Date  ? Atrial fibrillation ([place])   ? Coronary artery disease  s/p RCA stents   ? History of mitral valve repair   ? Hypertension   ? ICD (implantable cardioverter-defibrillator), biventricular, Medtronic NO  atrial lead   ? Kidney stones   ? Stroke [facility])   ? Ventricular tachycardia ([place])   ? ? ?Surgical History: ?Past Surgical History:  ?Procedure Laterality Date  ? CARDIAC CATHETERIZATION    ? CORONARY ANGIOPLASTY    ? Coronary stent 2009  2009  ? ICD IMPLANT    ? MITRAL VALVE REPAIR  October 16, 2014  ? ? ?Home Medications:  ?Allergies as of 05/16/2021   ? ?   Reactions  ? Flomax [tamsulosin Hcl]   ? ?  ? ?  ?Medication List  ?  ? ?  ? Accurate as of May 16, 2021 12:09 PM. If you have any questions, ask your nurse or doctor.  ?  ?  ? ?  ? ?aspirin 81 MG tablet ?Take 1 tablet (81 mg total) by mouth daily. ?  ?atorvastatin 20 MG tablet ?Commonly known as: LIPITOR ?Take 20 mg by mouth daily. ?   ?CVS D3 50 MCG (2000 UT) Caps ?Generic drug: Cholecalciferol ?Take 2,000 Units by mouth daily. ?  ?dabigatran 150 MG Caps capsule ?Commonly known as: PRADAXA ?Take 1 capsule (150 mg total) by mouth 2 (two) times daily. ?  ?finasteride 5 MG tablet ?Commonly known as: PROSCAR ?Take 1 tablet (5 mg total) by mouth daily. ?  ?fludrocortisone 0.1 MG tablet ?Commonly known as: FLORINEF ?Take 100 mcg by mouth daily. ?  ?furosemide 20 MG tablet ?Commonly known as: LASIX ?Take 20 mg by mouth daily. ?  ?HYDROcodone-acetaminophen 5-325 MG tablet ?Commonly known as: NORCO/VICODIN ?Take 1 tablet by mouth every 6 (six) hours as needed for moderate pain. ?  ?ipratropium 0.03 % nasal spray ?Commonly known as: ATROVENT ?Place 1-2 sprays into both nostrils 2 (two) times daily as needed for rhinitis. ?  ?metoprolol succinate 25 MG 24 hr tablet ?Commonly known as: TOPROL-XL ?Take 1 tablet (25 mg total) by mouth 2 (two) times daily. This is an increase from 12.5 mg twice daily. ?  ?mexiletine 200 MG capsule ?Commonly known as: MEXITIL ?Take 1 capsule (200 mg total) by mouth 3 (three) times daily. This is an increase from 2 times daily. ?  ?nitroGLYCERIN 0.4 MG SL tablet ?Commonly known as: NITROSTAT ?Place 1 tablet (0.4 mg total) under the tongue every 5 (five) minutes as needed for chest pain. ?  ?Potassium Chloride ER 20 MEQ  Tbcr ?Take 40 mEq by mouth daily. ?  ?Trelegy Ellipta 100-62.5-25 MCG/ACT Aepb ?Generic drug: Fluticasone-Umeclidin-Vilant ?Inhale 1 puff into the lungs daily as needed (respiratory problems). ?  ?venlafaxine XR 75 MG 24 hr capsule ?Commonly known as: EFFEXOR-XR ?Take 75 mg by mouth daily. ?  ?vitamin B-12 1000 MCG tablet ?Commonly known as: CYANOCOBALAMIN ?Take 1,000 mcg by mouth daily. ?  ? ?  ? ? ?Allergies:  ?Allergies  ?Allergen Reactions  ? Flomax [Tamsulosin Hcl]   ? ? ?Family History: ?No family history on file. ? ?Social History:  reports that he has quit smoking. His smoking use included cigarettes. He  has a 40.00 pack-year smoking history. He has never used smokeless tobacco. He reports that he does not currently use alcohol. He reports that he does not use drugs. ? ? ?Physical Exam: ?BP 126/79   Pulse 84   Ht 5\' 11"  (1.803 m)   Wt 183 lb (83 kg)   BMI 25.52 kg/m?   ?Constitutional:  Alert and oriented, No acute distress. ?HEENT: [place] AT, moist mucus membranes.  Trachea midline, no masses. ?Cardiovascular: No clubbing, cyanosis, or edema. ?Respiratory: Normal respiratory effort, no increased work of breathing. ?Neurologic: Grossly intact, no focal deficits, moving all 4 extremities. ?Psychiatric: Normal mood and affect. ? ? ?Assessment & Plan:   ? ?1.  BPH with LUTS ?Stable ?Bladder scan PVR 0 mL ? ?2.  Hematuria ?UA today >30 RBC ?We discussed potential causes including BPH, nephrolithiasis and exacerbated by Pradaxa.  Last evaluation negative for any evidence of urologic tumor however consider reimaging/cystoscopy  ? ?3.  Nephrolithiasis ?KUB today, will call with results ? ? ?Abbie Sons, MD ? ?[facility] ?[address], [address] ?[place], [place] [address] ?[phone] ? ? ?

## 2021-05-17 ENCOUNTER — Telehealth: Payer: Self-pay | Admitting: Urology

## 2021-05-17 DIAGNOSIS — N401 Enlarged prostate with lower urinary tract symptoms: Secondary | ICD-10-CM

## 2021-05-17 DIAGNOSIS — R310 Gross hematuria: Secondary | ICD-10-CM

## 2021-05-17 DIAGNOSIS — N200 Calculus of kidney: Secondary | ICD-10-CM

## 2021-05-17 LAB — URINALYSIS, COMPLETE
Bilirubin, UA: NEGATIVE
Glucose, UA: NEGATIVE
Ketones, UA: NEGATIVE
Nitrite, UA: NEGATIVE
Specific Gravity, UA: 1.02 (ref 1.005–1.030)
Urobilinogen, Ur: 1 mg/dL (ref 0.2–1.0)
pH, UA: 7 (ref 5.0–7.5)

## 2021-05-17 LAB — MICROSCOPIC EXAMINATION: RBC, Urine: >30 /hpf — AB (ref 0–2)

## 2021-05-17 NOTE — Telephone Encounter (Signed)
CT showed stable renal calculi.  Since he has continued gross hematuria recommend follow-up CT urogram and cystoscopy to make sure there have been no changes since his last evaluation.  CTU order entered.  Please schedule cystoscopy ?

## 2021-05-20 NOTE — Telephone Encounter (Signed)
Notified patient as instructed, patient pleased. Discussed follow-up appointments, patient agrees  

## 2021-05-21 ENCOUNTER — Encounter: Payer: Self-pay | Admitting: Urology

## 2021-06-13 ENCOUNTER — Other Ambulatory Visit: Payer: Self-pay

## 2021-06-13 ENCOUNTER — Ambulatory Visit
Admission: RE | Admit: 2021-06-13 | Discharge: 2021-06-13 | Disposition: A | Discharge status: Home / Self Care | Payer: Medicare HMO | Source: Ambulatory Visit | Attending: Urology | Admitting: Urology

## 2021-06-13 DIAGNOSIS — N200 Calculus of kidney: Secondary | ICD-10-CM | POA: Insufficient documentation

## 2021-06-13 DIAGNOSIS — N401 Enlarged prostate with lower urinary tract symptoms: Secondary | ICD-10-CM | POA: Insufficient documentation

## 2021-06-13 DIAGNOSIS — R310 Gross hematuria: Secondary | ICD-10-CM | POA: Insufficient documentation

## 2021-06-13 MED ORDER — IOHEXOL 300 MG/ML  SOLN
125.0000 mL | Freq: Once | INTRAMUSCULAR | Status: AC | PRN
  Administered 2021-06-13: 125 mL via INTRAVENOUS

## 2021-06-23 ENCOUNTER — Encounter: Payer: Self-pay | Admitting: Urology

## 2021-06-23 ENCOUNTER — Other Ambulatory Visit: Payer: Medicare HMO | Admitting: Urology

## 2021-09-19 ENCOUNTER — Other Ambulatory Visit: Payer: Self-pay

## 2021-09-19 ENCOUNTER — Emergency Department: Payer: Medicare HMO

## 2021-09-19 ENCOUNTER — Emergency Department
Admission: EM | Admit: 2021-09-19 | Discharge: 2021-09-19 | Disposition: A | Discharge status: Home / Self Care | Payer: Medicare HMO | Attending: Emergency Medicine | Admitting: Emergency Medicine

## 2021-09-19 DIAGNOSIS — S0512XA Contusion of eyeball and orbital tissues, left eye, initial encounter: Secondary | ICD-10-CM | POA: Diagnosis not present

## 2021-09-19 DIAGNOSIS — S0083XA Contusion of other part of head, initial encounter: Secondary | ICD-10-CM | POA: Insufficient documentation

## 2021-09-19 DIAGNOSIS — S63502A Unspecified sprain of left wrist, initial encounter: Secondary | ICD-10-CM | POA: Diagnosis not present

## 2021-09-19 DIAGNOSIS — Z7901 Long term (current) use of anticoagulants: Secondary | ICD-10-CM | POA: Insufficient documentation

## 2021-09-19 DIAGNOSIS — W19XXXA Unspecified fall, initial encounter: Secondary | ICD-10-CM | POA: Diagnosis not present

## 2021-09-19 DIAGNOSIS — Y9301 Activity, walking, marching and hiking: Secondary | ICD-10-CM | POA: Insufficient documentation

## 2021-09-19 DIAGNOSIS — W01198A Fall on same level from slipping, tripping and stumbling with subsequent striking against other object, initial encounter: Secondary | ICD-10-CM | POA: Insufficient documentation

## 2021-09-19 DIAGNOSIS — S6992XA Unspecified injury of left wrist, hand and finger(s), initial encounter: Secondary | ICD-10-CM | POA: Diagnosis present

## 2021-09-19 DIAGNOSIS — S0990XA Unspecified injury of head, initial encounter: Secondary | ICD-10-CM | POA: Diagnosis present

## 2021-09-19 NOTE — ED Provider Notes (Signed)
[facility] Provider Note    Event Date/Time   First MD Initiated Contact with Patient 09/19/21 1949     (approximate)   History   Fall   HPI  Darius Miller is a 76 y.o. male who presents to the emergency department today after suffering a fall.  The patient says that he was walking his dog when he thinks his feet caught a planter.  He did fall and hit his left wrist and his face.  He denies loss of consciousness.  Is complaining primarily of pain to the left wrist.  States that he is on Eliquis. Denies any blurry vision or double vision.    Physical Exam   Triage Vital Signs: ED Triage Vitals  Enc Vitals Group     BP 09/19/21 1549 109/75     Pulse Rate 09/19/21 1549 68     Resp 09/19/21 1549 18     Temp 09/19/21 1549 97.6 F (36.4 C)     Temp Source 09/19/21 1549 Oral     SpO2 09/19/21 1549 100 %     Weight 09/19/21 1545 180 lb (81.6 kg)     Height 09/19/21 1545 5\' 11"  (1.803 m)     Head Circumference --      Peak Flow --      Pain Score 09/19/21 1544 4     Pain Loc --      Pain Edu? --      Excl. in GC? --     Most recent vital signs: Vitals:   09/19/21 2110 09/19/21 2115  BP: 102/75 107/80  Pulse: 73   Resp: 18   Temp: 97.7 F (36.5 C)   SpO2: 98%     General: Awake, alert, oriented. CV:  Good peripheral perfusion. Regular rate and rhythm. Resp:  Normal effort. Lungs clear Abd:  No distention.  Other:  Bruising noted around the left eye. EOMI. Slight swelling and bruising to the left wrist. No significant tenderness over snuff box.   ED Results / Procedures / Treatments   Labs (all labs ordered are listed, but only abnormal results are displayed) Labs Reviewed - No data to display   EKG  None   RADIOLOGY I independently interpreted and visualized the left hip x ray. My interpretation: No acute fracture or dislocation Radiology interpretation:  IMPRESSION:  No acute fracture or dislocation.    I independently  interpreted and visualized the CT head. My interpretation: No bleed. Radiology interpretation:  IMPRESSION:  No acute intracranial findings are seen in noncontrast CT brain.  Atrophy. Small vessel disease.    Chronic sinusitis.    I independently interpreted and visualized the CT cervical spine. My interpretation: No acute abnormality Radiology interpretation:  IMPRESSION:  Degenerative change in the cervical spine without acute fracture or  subluxation.    I independently interpreted and visualized the left wrist. My interpretation: No acute osseous abnormality Radiology interpretation:  IMPRESSION:  Negative.     PROCEDURES:  Critical Care performed: No  Procedures   MEDICATIONS ORDERED IN ED: Medications - No data to display   IMPRESSION / MDM / ASSESSMENT AND PLAN / ED COURSE  I reviewed the triage vital signs and the nursing notes.                              Differential diagnosis includes, but is not limited to, intracranial bleed, fracture/dislocation  Patient's presentation is most  consistent with acute presentation with potential threat to life or bodily function.  Patient presented to the emergency department today after suffering a mechanical fall.  Patient is on Eliquis.  CT head and cervical spine without concerning bleed or acute osseous abnormality.  Additionally x-rays of the left wrist and left hip did not show any acute abnormality.  Discussed these findings with the patient.  We will plan on discharging home.   FINAL CLINICAL IMPRESSION(S) / ED DIAGNOSES   Final diagnoses:  Fall, initial encounter  Sprain of left wrist, initial encounter     Rx / DC Orders   ED Discharge Orders     None        Note:  This document was prepared using Dragon voice recognition software and may include unintentional dictation errors.    Phineas Semen, MD 09/19/21 2232

## 2021-09-19 NOTE — Discharge Instructions (Signed)
Please seek medical attention for any high fevers, chest pain, shortness of breath, change in behavior, persistent vomiting, bloody stool or any other new or concerning symptoms.  

## 2021-09-19 NOTE — ED Triage Notes (Addendum)
Pt in from home; pt went to [facility] after fall today; pt hit head on cement per pt; pt's L eye severely bruised; denies HA, blurred vision; c/o L wrist pain; A&OX4. Pt confirms is on eliquis.

## 2022-02-18 ENCOUNTER — Ambulatory Visit
Admission: RE | Admit: 2022-02-18 | Discharge: 2022-02-18 | Disposition: A | Discharge status: Home / Self Care | Payer: Medicare HMO | Source: Ambulatory Visit | Attending: Urology | Admitting: Urology

## 2022-02-18 ENCOUNTER — Encounter: Payer: Self-pay | Admitting: Urology

## 2022-02-18 ENCOUNTER — Ambulatory Visit: Payer: Medicare HMO | Admitting: Urology

## 2022-02-18 VITALS — BP 86/58 | HR 87 | Ht 71.0 in | Wt 125.0 lb

## 2022-02-18 DIAGNOSIS — N201 Calculus of ureter: Secondary | ICD-10-CM

## 2022-02-18 DIAGNOSIS — R310 Gross hematuria: Secondary | ICD-10-CM | POA: Diagnosis not present

## 2022-02-18 DIAGNOSIS — N200 Calculus of kidney: Secondary | ICD-10-CM

## 2022-02-18 DIAGNOSIS — M5489 Other dorsalgia: Secondary | ICD-10-CM | POA: Diagnosis not present

## 2022-02-18 NOTE — Progress Notes (Signed)
02/18/2022 12:24 PM   Darius Miller 12-06-1945 [id number]  Referring provider: Gracelyn Nurse, MD [address] [place],  [place] [address]  Chief Complaint  Patient presents with   Hematuria    Urologic history: 1.  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  No bladder mucosal lesions   2.  Nephrolithiasis Bilateral, nonobstructing renal calculi Open pyelolithotomy 2011 for a 3-4 cm left renal calculus after failed PCNL  HPI: 76 y.o. male called for an acute visit for right back pain.  Last office visit March 2023; CTU 06/13/2021 showed bilateral, nonobstructing renal calculi and bilateral benign renal cysts 1 month history of right low back pain.  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)    Ventricular tachycardia [facility])     Surgical History: Past Surgical History:  Procedure Laterality Date   CARDIAC CATHETERIZATION     CORONARY ANGIOPLASTY     Coronary stent 2009  2009   ICD IMPLANT     MITRAL VALVE REPAIR  October 16, 2014    Home Medications:  Allergies as of 02/18/2022       Reactions   Flomax [tamsulosin Hcl]         Medication List        Accurate as of  February 18, 2022 12:24 PM. If you have any questions, ask your nurse or doctor.          aspirin 81 MG tablet Take 1 tablet (81 mg total) by mouth daily.   atorvastatin 20 MG tablet Commonly known as: LIPITOR Take 20 mg by mouth daily.   CVS D3 50 MCG (2000 UT) Caps Generic drug: Cholecalciferol Take 2,000 Units by mouth daily.   cyanocobalamin 1000 MCG tablet Commonly known as: VITAMIN B12 Take 1,000 mcg by mouth daily.   dabigatran 150 MG Caps capsule Commonly known as: PRADAXA Take 1 capsule (150 mg total) by mouth 2 (two) times daily.   finasteride 5 MG tablet Commonly known as: PROSCAR Take 1 tablet (5 mg total) by mouth daily.   fludrocortisone 0.1 MG tablet Commonly known as: FLORINEF Take 100 mcg by mouth daily.   furosemide 20 MG tablet Commonly known as: LASIX Take 20 mg by mouth daily.   HYDROcodone-acetaminophen 5-325 MG tablet Commonly known as: NORCO/VICODIN Take 1 tablet by mouth every 6 (six) hours as needed for moderate pain.   ipratropium 0.03 % nasal spray Commonly known as: ATROVENT Place 1-2 sprays into both nostrils 2 (two) times daily as needed for rhinitis.   metoprolol succinate 25 MG 24 hr tablet Commonly known as: TOPROL-XL Take 1 tablet (25 mg total) by mouth 2 (two) times daily. This is an increase from 12.5 mg twice daily.   mexiletine 200 MG  capsule Commonly known as: MEXITIL Take 1 capsule (200 mg total) by mouth 3 (three) times daily. This is an increase from 2 times daily.   nitroGLYCERIN 0.4 MG SL tablet Commonly known as: NITROSTAT Place 1 tablet (0.4 mg total) under the tongue every 5 (five) minutes as needed for chest pain.   Potassium Chloride ER 20 MEQ Tbcr Take 40 mEq by mouth daily.   Trelegy Ellipta 100-62.5-25 MCG/ACT Aepb Generic drug: Fluticasone-Umeclidin-Vilant Inhale 1 puff into the lungs daily as needed (respiratory problems).   venlafaxine XR 75 MG 24 hr capsule Commonly known as: EFFEXOR-XR Take 75  mg by mouth daily.        Allergies:  Allergies  Allergen Reactions   Flomax [Tamsulosin Hcl]     Family History: No family history on file.  Social History:  reports that he has quit smoking. His smoking use included cigarettes. He has a 40.00 pack-year smoking history. He has never used smokeless tobacco. He reports that he does not currently use alcohol. He reports that he does not use drugs.   Physical Exam: BP (!) 86/58   Pulse 87   Ht 5\' 11"  (1.803 m)   Wt 125 lb (56.7 kg)   BMI 17.43 kg/m   Constitutional:  Alert and oriented, No acute distress. HEENT: [place] AT Respiratory: Normal respiratory effort, no increased work of breathing. GI: Abdomen is soft, nontender, nondistended, no abdominal masses GU: No CVA tenderness Skin: No rashes, bruises or suspicious lesions. Neurologic: Grossly intact, no focal deficits, moving all 4 extremities. Psychiatric: Normal mood and affect.  Laboratory Data:  Urinalysis Dipstick/microscopy negative    Assessment & Plan:    1. Nephrolithiasis Right back pain.  Although his pain is lower than typical for stone KUB was ordered to assess for ureteral stone migration.  If a calculus is not visualized would recommend noncontrast CT  2.  Hematuria Recent episode gross hematuria.  Cystoscopy was recommended at time of recent CT urogram though it looks like it was never scheduled.  Schedule cystoscopy after KUB and/or CT reviewed   , MD  [facility] [address] [place], [place] [phone]

## 2022-02-20 ENCOUNTER — Other Ambulatory Visit: Payer: Self-pay | Admitting: Urology

## 2022-02-20 ENCOUNTER — Encounter: Payer: Self-pay | Admitting: *Deleted

## 2022-02-20 DIAGNOSIS — M545 Low back pain, unspecified: Secondary | ICD-10-CM

## 2022-02-20 DIAGNOSIS — N200 Calculus of kidney: Secondary | ICD-10-CM

## 2022-03-05 ENCOUNTER — Other Ambulatory Visit (INDEPENDENT_AMBULATORY_CARE_PROVIDER_SITE_OTHER): Payer: Self-pay | Admitting: Vascular Surgery

## 2022-03-05 DIAGNOSIS — I739 Peripheral vascular disease, unspecified: Secondary | ICD-10-CM

## 2022-03-10 ENCOUNTER — Ambulatory Visit (INDEPENDENT_AMBULATORY_CARE_PROVIDER_SITE_OTHER): Payer: Medicare HMO

## 2022-03-10 ENCOUNTER — Ambulatory Visit (INDEPENDENT_AMBULATORY_CARE_PROVIDER_SITE_OTHER): Payer: Medicare HMO | Admitting: Vascular Surgery

## 2022-03-10 ENCOUNTER — Encounter (INDEPENDENT_AMBULATORY_CARE_PROVIDER_SITE_OTHER): Payer: Self-pay | Admitting: Vascular Surgery

## 2022-03-10 VITALS — BP 96/71 | HR 74 | Resp 16 | Ht 71.0 in

## 2022-03-10 DIAGNOSIS — E785 Hyperlipidemia, unspecified: Secondary | ICD-10-CM

## 2022-03-10 DIAGNOSIS — I739 Peripheral vascular disease, unspecified: Secondary | ICD-10-CM | POA: Diagnosis not present

## 2022-03-10 DIAGNOSIS — I5022 Chronic systolic (congestive) heart failure: Secondary | ICD-10-CM

## 2022-03-10 DIAGNOSIS — I70213 Atherosclerosis of native arteries of extremities with intermittent claudication, bilateral legs: Secondary | ICD-10-CM | POA: Diagnosis not present

## 2022-03-10 NOTE — Progress Notes (Unsigned)
Patient ID: Darius Miller, male   DOB: 05-Dec-1945, 78 y.o.   MRN: [id number]  Chief Complaint  Patient presents with   New Patient (Initial Visit)    Ref Excell Seltzer consult PVD, concern for circulation as it appears diminshed    HPI Darius Miller is a 77 y.o. male.  I am asked to see the patient by Dr. Excell Seltzer for evaluation of PAD.  Patient was noted to have poorly palpable pedal pulses particularly in the left lower extremity on recent exam.  He does describe pain with activity.  This is generally a cramping and tiredness in his legs worse on the left than the right.  This happens at short to medium distances of walking.  No open wounds.  He denies any pain at rest.  No pain that wakes him from sleep.  He has got a previous history of coronary disease and has had coronary interventions in the past.  He also has a history of stroke and cardiac arrhythmias.   Noninvasive studies were performed today showing a right ABI of 1.18 with biphasic waveforms but a reduced digital pressure of 62.  On the left, his ABI was 0.77 with monophasic waveforms.   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  atrial lead    Kidney stones    Stroke [facility])    Ventricular tachycardia (HCC)     Past Surgical History:  Procedure Laterality Date   CARDIAC CATHETERIZATION     CORONARY ANGIOPLASTY     Coronary stent 2009  2009   ICD IMPLANT     MITRAL VALVE REPAIR  October 16, 2014     Family History  Problem Relation Age of Onset   Hypertension Mother    Heart disease Father    Hypertension Father    Heart attack Father       Social History   Tobacco Use   Smoking status: Former    Packs/day: 2.00    Years: 20.00    Total pack years: 40.00    Types: Cigarettes   Smokeless tobacco: Never  Substance Use  Topics   Alcohol use: Not Currently    Alcohol/week: 0.0 standard drinks of alcohol   Drug use: Never     Allergies  Allergen Reactions   Amiodarone Other (See Comments)    Weakness, pulmonary opacities   Flomax [Tamsulosin Hcl]     Current Outpatient Medications  Medication Sig Dispense Refill   aspirin 81 MG tablet Take 1 tablet (81 mg total) by mouth daily. 30 tablet    atorvastatin (LIPITOR) 20 MG tablet Take 20 mg by mouth daily.  3   CVS D3 50 MCG (2000 UT) CAPS Take 2,000 Units by mouth daily.     dabigatran (PRADAXA) 150 MG CAPS capsule Take 1 capsule (150 mg total) by mouth 2 (two) times daily. 60 capsule    ELIQUIS 5 MG TABS tablet Take 5 mg by mouth 2 (two) times daily.     finasteride (PROSCAR) 5 MG tablet Take 1 tablet (5 mg total) by mouth daily. 30 tablet 3   fludrocortisone (FLORINEF) 0.1 MG tablet Take 100 mcg by mouth daily.     furosemide (LASIX) 20 MG tablet Take 20 mg by mouth daily.     HYDROcodone-acetaminophen (NORCO/VICODIN) 5-325  MG tablet Take 1 tablet by mouth every 6 (six) hours as needed for moderate pain. 10 tablet 0   ipratropium (ATROVENT) 0.03 % nasal spray Place 1-2 sprays into both nostrils 2 (two) times daily as needed for rhinitis.     nitroGLYCERIN (NITROSTAT) 0.4 MG SL tablet Place 1 tablet (0.4 mg total) under the tongue every 5 (five) minutes as needed for chest pain.  12   Potassium Chloride ER 20 MEQ TBCR Take 40 mEq by mouth daily.     TRELEGY ELLIPTA 100-62.5-25 MCG/INH AEPB Inhale 1 puff into the lungs daily as needed (respiratory problems).     venlafaxine XR (EFFEXOR-XR) 75 MG 24 hr capsule Take 75 mg by mouth daily.      vitamin B-12 (CYANOCOBALAMIN) 1000 MCG tablet Take 1,000 mcg by mouth daily.     metoprolol succinate (TOPROL-XL) 25 MG 24 hr tablet Take 1 tablet (25 mg total) by mouth 2 (two) times daily. This is an increase from 12.5 mg twice daily. 60 tablet 0   mexiletine (MEXITIL) 200 MG capsule Take 1 capsule (200 mg total) by  mouth 3 (three) times daily. This is an increase from 2 times daily. 90 capsule 0   No current facility-administered medications for this visit.      REVIEW OF SYSTEMS (Negative unless checked)  Constitutional: [] Weight loss  [] Fever  [] Chills Cardiac: [] Chest pain   [] Chest pressure   [x] Palpitations   [] Shortness of breath when laying flat   [] Shortness of breath at rest   [] Shortness of breath with exertion. Vascular:  [x] Pain in legs with walking   [] Pain in legs at rest   [] Pain in legs when laying flat   [x] Claudication   [] Pain in feet when walking  [] Pain in feet at rest  [] Pain in feet when laying flat   [] History of DVT   [] Phlebitis   [] Swelling in legs   [] Varicose veins   [] Non-healing ulcers Pulmonary:   [] Uses home oxygen   [] Productive cough   [] Hemoptysis   [] Wheeze  [] COPD   [] Asthma Neurologic:  [] Dizziness  [] Blackouts   [] Seizures   [x] History of stroke   [] History of TIA  [] Aphasia   [] Temporary blindness   [] Dysphagia   [] Weakness or numbness in arms   [] Weakness or numbness in legs Musculoskeletal:  [x] Arthritis   [] Joint swelling   [] Joint pain   [] Low back pain Hematologic:  [] Easy bruising  [] Easy bleeding   [] Hypercoagulable state   [] Anemic  [] Hepatitis Gastrointestinal:  [] Blood in stool   [] Vomiting blood  [] Gastroesophageal reflux/heartburn   [] Abdominal pain Genitourinary:  [] Chronic kidney disease   [] Difficult urination  [] Frequent urination  [] Burning with urination   [] Hematuria Skin:  [] Rashes   [] Ulcers   [] Wounds Psychological:  [] History of anxiety   []  History of major depression.    Physical Exam BP 96/71 (BP Location: Left Arm)   Pulse 74   Resp 16   Ht 5\' 11"  (1.803 m)   BMI 17.43 kg/m  Gen:  WD/WN, NAD. Appears younger than stated age. Head: [place]/AT, No temporalis wasting. Ear/Nose/Throat: Hearing grossly intact, nares w/o erythema or drainage, oropharynx w/o Erythema/Exudate Eyes: Conjunctiva clear, sclera non-icteric  Neck: trachea  midline.  No JVD.  Pulmonary:  Good air movement, respirations not labored, no use of accessory muscles  Cardiac: irregular Vascular:  Vessel Right Left  Radial Palpable Palpable  DP 1+ 1+  PT 2+ 1+   Gastrointestinal:. No masses, surgical incisions, or scars. Musculoskeletal: M/S 5/5 throughout.  Extremities without ischemic changes.  No deformity or atrophy. No edema. Neurologic: Sensation grossly intact in extremities.  Symmetrical.  Speech is fluent. Motor exam as listed above. Psychiatric: Judgment intact, Mood & affect appropriate for pt's clinical situation. Dermatologic: No rashes or ulcers noted.  No cellulitis or open wounds.    Radiology Abdomen 1 view (KUB)  Result Date: 02/19/2022 CLINICAL DATA:  Renal stones EXAM: ABDOMEN - 1 VIEW COMPARISON:  Previous studies including radiograph done on 05/16/2021 and CT done on 06/13/2021 FINDINGS: Bowel gas pattern is nonspecific. There are few left renal stones measuring up to 12 mm. There is 12 mm calculus in the right kidney. There are other possible smaller bilateral renal stones. There is evidence of previous right inguinal hernia repair. Degenerative changes are noted in lumbar spine. Dense pleural calcifications are noted in lower lung fields. IMPRESSION: Bilateral renal stones with no significant interval change. Electronically Signed   By: Elmer Picker M.D.   On: 02/19/2022 20:43    Labs No results found for this or any previous visit (from the past 2160 hour(s)).  Assessment/Plan:  Atherosclerosis of native arteries of extremity with intermittent claudication (HCC) Noninvasive studies were performed today showing a right ABI of 1.18 with biphasic waveforms but a reduced digital pressure of 62.  On the left, his ABI was 0.77 with monophasic waveforms. We had a long discussion today regarding peripheral arterial disease pathophysiology and treatment options.  He is already on appropriate  medical regimen with anticoagulation just for his heart, aspirin, and a statin agent.  He is fairly active.  I have discussed with him options which would be intervention versus a dedicated exercise regimen and his current medications.  I discussed he does not have any limb threatening symptoms at this point.  I discussed the risk and benefits of any procedures that would be performed.  At this point, the patient wants to try to increase his activity to build collaterals and continue his current medications which sounds reasonable.  Will do a follow-up in 3 to 4 months with noninvasive studies.  He will contact our office with worsening problems in the interim.  Chronic systolic heart failure ([place]) Follows with cardiology.  On anticoagulation.  Hyperlipidemia lipid control important in reducing the progression of atherosclerotic disease. Continue statin therapy      Leotis Pain 03/11/2022, 2:20 PM   This note was created with Dragon medical transcription system.  Any errors from dictation are unintentional.

## 2022-03-11 DIAGNOSIS — E785 Hyperlipidemia, unspecified: Secondary | ICD-10-CM | POA: Insufficient documentation

## 2022-03-11 DIAGNOSIS — I70219 Atherosclerosis of native arteries of extremities with intermittent claudication, unspecified extremity: Secondary | ICD-10-CM | POA: Insufficient documentation

## 2022-03-11 NOTE — Assessment & Plan Note (Signed)
Noninvasive studies were performed today showing a right ABI of 1.18 with biphasic waveforms but a reduced digital pressure of 62.  On the left, his ABI was 0.77 with monophasic waveforms. We had a long discussion today regarding peripheral arterial disease pathophysiology and treatment options.  He is already on appropriate medical regimen with anticoagulation just for his heart, aspirin, and a statin agent.  He is fairly active.  I have discussed with him options which would be intervention versus a dedicated exercise regimen and his current medications.  I discussed he does not have any limb threatening symptoms at this point.  I discussed the risk and benefits of any procedures that would be performed.  At this point, the patient wants to try to increase his activity to build collaterals and continue his current medications which sounds reasonable.  Will do a follow-up in 3 to 4 months with noninvasive studies.  He will contact our office with worsening problems in the interim.

## 2022-03-11 NOTE — Assessment & Plan Note (Signed)
lipid control important in reducing the progression of atherosclerotic disease. Continue statin therapy  

## 2022-03-11 NOTE — Assessment & Plan Note (Signed)
Follows with cardiology.  On anticoagulation. 

## 2022-03-11 NOTE — Patient Instructions (Signed)
Peripheral Vascular Disease  Peripheral vascular disease (PVD) is a disease of the blood vessels that carry blood from the heart to the rest of the body. PVD is also called peripheral artery disease (PAD) or poor circulation. PVD affects most of the body. But it affects the legs and feet the most. PVD can lead to acute limb ischemia. This happens when there is a sudden stop of blood flow to an arm or leg. This is a medical emergency. What are the causes? The most common cause of PVD is a buildup of a fatty substance (plaque) inside your arteries. This decreases blood flow. Plaque can break off and block blood in a smaller artery. This can lead to acute limb ischemia. Other common causes of PVD include: Blood clots inside the blood vessels. Injuries to blood vessels. Irritation and swelling of blood vessels. Sudden tightening of the blood vessel (spasms). What increases the risk? A family history of PVD. Medical conditions, including: High cholesterol. Diabetes. High blood pressure. Heart disease. Past problems with blood clots. Past injury, such as burns or a broken bone. Other conditions, such as: Buerger's disease. This is caused by swollen or irritated blood vessels in your hands and feet. Arthritis. Birth defects that affect the arteries in your legs. Kidney disease. Using tobacco or nicotine products. Not getting enough exercise. Being very overweight (obese). Being 50 years old or older. What are the signs or symptoms? Cramps in your butt, legs, and feet. Pain and weakness in your legs when you are active that goes away when you rest. Leg pain when at rest. Leg numbness, tingling, or weakness. Coldness in a leg or foot, especially when compared with the other leg or foot. Skin or hair changes. These can include: Hair loss. Shiny skin. Pale or bluish skin. Thick toenails. Being unable to get or keep an erection. Tiredness (fatigue). Weak pulse or no pulse in the  feet. Wounds and sores on the toes, feet, or legs. These take longer to heal. How is this treated? Underlying causes are treated first. Other conditions, like diabetes, high cholesterol, and blood pressure, are also treated. Treatment may include: Lifestyle changes, such as: Quitting smoking. Getting regular exercise. Having a diet low in fat and cholesterol. Not drinking alcohol. Taking medicines, such as: Blood thinners. Medicines to improve blood flow. Medicines to improve your blood cholesterol. Procedures to: Open the arteries and restore blood flow. Insert a small mesh tube (stent) to keep a blocked vessel open. Create a new path for blood to flow to the body (peripheral bypass). Remove dead tissue from a wound. Remove an affected leg or arm. Follow these instructions at home: Medicines Take over-the-counter and prescription medicines only as told by your doctor. If you are taking blood thinners: Talk with your doctor before you take any medicines that have aspirin, or NSAIDs, such as ibuprofen. Take medicines exactly as told. Take them at the same time each day. Avoid doing things that could hurt or bruise you. Take action to prevent falls. Wear an alert bracelet or carry a card that shows you are taking blood thinners. Lifestyle     Get regular exercise. Ask your doctor about how to stay active. Talk with your doctor about keeping a healthy weight. If needed, ask about losing weight. Eat a diet that is low in fat and cholesterol. If you need help, talk with your doctor. Do not drink alcohol. Do not smoke or use any products that contain nicotine or tobacco. If you need help   quitting, ask your doctor. General instructions Take good care of your feet. To do this: Wear shoes that fit well and feel good. Check your feet often for any cuts or sores. Get a flu shot (influenza vaccine) each year. Keep all follow-up visits. Where to find more information Society for  Vascular Surgery: vascular.org American Heart Association: heart.org National Heart, Lung, and Blood Institute: nhlbi.nih.gov Contact a doctor if: You have cramps in your legs when you walk. You have leg pain when you rest. Your leg or foot feels cold. Your skin changes. You cannot get or keep an erection. You have cuts or sores on your legs or feet that do not heal. Get help right away if: You have sudden changes in the color and feeling of your arms or legs, such as: Your arm or leg turns cold, numb, and blue. Your arm or leg becomes red, warm, swollen, painful, or numb. You have any signs of a stroke. "BE FAST" is an easy way to remember the main warning signs: B - Balance. Dizziness, sudden trouble walking, or loss of balance. E - Eyes. Trouble seeing or a change in how you see. F - Face. Sudden weakness or loss of feeling of the face. The face or eyelid may droop on one side. A - Arms. Weakness or loss of feeling in an arm. This happens all of a sudden and most often on one side of the body. S - Speech. Sudden trouble speaking, slurred speech, or trouble understanding what people say. T - Time. Time to call emergency services. Write down what time symptoms started. You have other signs of a stroke, such as: A sudden, very bad headache with no known cause. Feeling like you may vomit (nausea). Vomiting. A seizure. You have chest pain or trouble breathing. These symptoms may be an emergency. Get help right away. Call your local emergency services (911 in the [place] Do not wait to see if the symptoms will go away. Do not drive yourself to the hospital. Summary Peripheral vascular disease (PVD) is a disease of the blood vessels. PVD affects the legs and feet the most. Symptoms may include leg pain or leg numbness, tingling, and weakness. Treatment may include lifestyle changes, medicines, and procedures. This information is not intended to replace advice given to you by your health  care provider. Make sure you discuss any questions you have with your health care provider. Document Revised: 08/28/2019 Document Reviewed: 08/28/2019 Elsevier Patient Education  2023 [employer] Inc.  

## 2022-03-25 ENCOUNTER — Ambulatory Visit
Admission: RE | Admit: 2022-03-25 | Discharge: 2022-03-25 | Disposition: A | Discharge status: Home / Self Care | Payer: Medicare HMO | Source: Ambulatory Visit | Attending: Urology | Admitting: Urology

## 2022-03-25 DIAGNOSIS — N200 Calculus of kidney: Secondary | ICD-10-CM | POA: Diagnosis present

## 2022-03-25 DIAGNOSIS — M545 Low back pain, unspecified: Secondary | ICD-10-CM | POA: Diagnosis present

## 2022-03-26 ENCOUNTER — Telehealth: Payer: Self-pay | Admitting: *Deleted

## 2022-03-26 NOTE — Telephone Encounter (Signed)
-----  Message from Abbie Sons, MD sent at 03/26/2022  3:50 PM EST ----- CT showed nonobstructing renal calculi and no obstructing stones in the ureter.  His right back pain would be musculoskeletal.  Since he did have an episode of gross hematuria recommend scheduling cystoscopy

## 2022-03-26 NOTE — Telephone Encounter (Signed)
Notified patient as instructed, patient pleased. Discussed follow-up appointments, patient agrees  

## 2022-04-27 ENCOUNTER — Encounter: Payer: Self-pay | Admitting: Urology

## 2022-04-27 ENCOUNTER — Ambulatory Visit: Payer: Medicare HMO | Admitting: Urology

## 2022-04-27 VITALS — BP 96/62 | HR 81 | Ht 71.0 in | Wt 174.0 lb

## 2022-04-27 DIAGNOSIS — R310 Gross hematuria: Secondary | ICD-10-CM

## 2022-04-27 DIAGNOSIS — N200 Calculus of kidney: Secondary | ICD-10-CM

## 2022-04-27 LAB — MICROSCOPIC EXAMINATION

## 2022-04-27 NOTE — Progress Notes (Signed)
   04/27/22  CC:  Chief Complaint  Patient presents with   Cysto    HPI:  Blood pressure 96/62, pulse 81, height 5' 11"$  (1.803 m), weight 174 lb (78.9 kg). NED. A&Ox3.   No respiratory distress   Abd soft, NT, ND Normal phallus with bilateral descended testicles  Cystoscopy Procedure Note  Patient identification was confirmed, informed consent was obtained, and patient was prepped using Betadine solution.  Lidocaine jelly was administered per urethral meatus.     Pre-Procedure: - Inspection reveals a normal caliber ureteral meatus.  Procedure: The flexible cystoscope was introduced without difficulty - No urethral strictures/lesions are present. - {Blank multiple:19197::"Enlarged","Surgically absent","Normal"} prostate *** - {Blank multiple:19197::"Normal","Elevated","Tight"} bladder neck - Bilateral ureteral orifices identified - Bladder mucosa  reveals no ulcers, tumors, or lesions - No bladder stones - No trabeculation  Retroflexion shows ***   Post-Procedure: - Patient tolerated the procedure well  Assessment/ Plan:   No follow-ups on file.  Abbie Sons, MD

## 2022-04-28 LAB — MICROSCOPIC EXAMINATION: RBC, Urine: >30 /hpf — AB (ref 0–2)

## 2022-04-28 LAB — URINALYSIS, COMPLETE
Bilirubin, UA: NEGATIVE
Glucose, UA: NEGATIVE
Ketones, UA: NEGATIVE
Nitrite, UA: NEGATIVE
Specific Gravity, UA: 1.015 (ref 1.005–1.030)
Urobilinogen, Ur: 0.2 mg/dL (ref 0.2–1.0)
pH, UA: 7 (ref 5.0–7.5)

## 2022-06-16 ENCOUNTER — Ambulatory Visit (INDEPENDENT_AMBULATORY_CARE_PROVIDER_SITE_OTHER): Payer: Medicare HMO

## 2022-06-16 ENCOUNTER — Encounter (INDEPENDENT_AMBULATORY_CARE_PROVIDER_SITE_OTHER): Payer: Self-pay | Admitting: Vascular Surgery

## 2022-06-16 ENCOUNTER — Ambulatory Visit (INDEPENDENT_AMBULATORY_CARE_PROVIDER_SITE_OTHER): Payer: Medicare HMO | Admitting: Vascular Surgery

## 2022-06-16 VITALS — BP 103/74 | HR 75 | Resp 16 | Wt 169.4 lb

## 2022-06-16 DIAGNOSIS — I10 Essential (primary) hypertension: Secondary | ICD-10-CM | POA: Insufficient documentation

## 2022-06-16 DIAGNOSIS — I70213 Atherosclerosis of native arteries of extremities with intermittent claudication, bilateral legs: Secondary | ICD-10-CM

## 2022-06-16 DIAGNOSIS — E785 Hyperlipidemia, unspecified: Secondary | ICD-10-CM | POA: Diagnosis not present

## 2022-06-16 NOTE — Assessment & Plan Note (Signed)
blood pressure control important in reducing the progression of atherosclerotic disease. On appropriate oral medications.  

## 2022-06-16 NOTE — Assessment & Plan Note (Signed)
ABIs today were mildly reduced at 0.96 on the right and 0.82 on the left.  Duplex showed 2 areas of hemodynamically significant stenosis on the right, 1 in the proximal SFA and 1 in the distal SFA.  The left SFA is occluded. Given his worsening symptoms on the right leg, I think proceeding with intervention is a reasonable option if he desires.  The patient strongly desirous of this would like [place] to plan an angiogram of the right lower extremity with intent to treat.  This would likely be SFA interventions and potentially tibial interventions.  I have discussed the pathophysiology and natural history of peripheral arterial disease.  Should he have a reasonably good result, in the future, we can certainly consider an angiogram and possible intervention on the left leg for his chronic occlusion of the SFA as well.  I discussed the risks and benefits of the procedure.  Patient voices understanding and desires to proceed with right lower extremity angiogram with possible intervention

## 2022-06-16 NOTE — Progress Notes (Unsigned)
MRN : [id number]  Darius Miller is a 77 y.o. (1946-02-09) male who presents with chief complaint of  Chief Complaint  Patient presents with   Follow-up    Ultrasound follow up  .  History of Present Illness: Patient returns today in follow up of atherosclerosis with claudication symptoms.  The patient reports worsening claudication symptoms in both lower extremities.  His right bothers him more than the left.  He says his walking distances have shortened and he is more limited than he was previously.  He denies any open wounds or infection.  No current symptoms of rest pain.  ABIs today were mildly reduced at 0.96 on the right and 0.82 on the left.  Duplex showed 2 areas of hemodynamically significant stenosis on the right, 1 in the proximal SFA and 1 in the distal SFA.  The left SFA is occluded.  Current Outpatient Medications  Medication Sig Dispense Refill   allopurinol (ZYLOPRIM) 100 MG tablet Take 100 mg by mouth daily.     aspirin 81 MG tablet Take 1 tablet (81 mg total) by mouth daily. 30 tablet    [employer] D3 50 MCG (2000 UT) CAPS Take 2,000 Units by mouth daily.     dabigatran (PRADAXA) 150 MG CAPS capsule Take 1 capsule (150 mg total) by mouth 2 (two) times daily. 60 capsule    ELIQUIS 5 MG TABS tablet Take 5 mg by mouth 2 (two) times daily.     finasteride (PROSCAR) 5 MG tablet Take 1 tablet (5 mg total) by mouth daily. 30 tablet 3   fludrocortisone (FLORINEF) 0.1 MG tablet Take 100 mcg by mouth daily.     furosemide (LASIX) 20 MG tablet Take 20 mg by mouth daily.     HYDROcodone-acetaminophen (NORCO/VICODIN) 5-325 MG tablet Take 1 tablet by mouth every 6 (six) hours as needed for moderate pain. 10 tablet 0   ipratropium (ATROVENT) 0.03 % nasal spray Place 1-2 sprays into both nostrils 2 (two) times daily as needed for rhinitis.     metoprolol succinate (TOPROL-XL) 25 MG 24 hr tablet Take 1 tablet (25 mg total) by mouth 2 (two) times daily. This is an increase from 12.5 mg twice  daily. 60 tablet 0   nitroGLYCERIN (NITROSTAT) 0.4 MG SL tablet Place 1 tablet (0.4 mg total) under the tongue every 5 (five) minutes as needed for chest pain.  12   Potassium Chloride ER 20 MEQ TBCR Take 40 mEq by mouth daily.     rosuvastatin (CRESTOR) 10 MG tablet Take 10 mg by mouth daily.     TRELEGY ELLIPTA 100-62.5-25 MCG/INH AEPB Inhale 1 puff into the lungs daily as needed (respiratory problems).     venlafaxine XR (EFFEXOR-XR) 75 MG 24 hr capsule Take 75 mg by mouth daily.      vitamin B-12 (CYANOCOBALAMIN) 1000 MCG tablet Take 1,000 mcg by mouth daily.     atorvastatin (LIPITOR) 20 MG tablet Take 20 mg by mouth daily. (Patient not taking: Reported on 06/16/2022)  3   mexiletine (MEXITIL) 200 MG capsule Take 1 capsule (200 mg total) by mouth 3 (three) times daily. This is an increase from 2 times daily. 90 capsule 0   No current facility-administered medications for this visit.    Past Medical History:  Diagnosis Date   Atrial fibrillation    Coronary artery disease  s/p RCA stents    History of mitral valve repair    Hypertension    ICD (implantable cardioverter-defibrillator), biventricular,  [employer] NO  atrial lead    Kidney stones    Stroke    Ventricular tachycardia     Past Surgical History:  Procedure Laterality Date   CARDIAC CATHETERIZATION     CORONARY ANGIOPLASTY     Coronary stent 2009  2009   ICD IMPLANT     MITRAL VALVE REPAIR  October 16, 2014     Social History   Tobacco Use   Smoking status: Former    Packs/day: 2.00    Years: 20.00    Additional pack years: 0.00    Total pack years: 40.00    Types: Cigarettes   Smokeless tobacco: Never  Substance Use Topics   Alcohol use: Not Currently    Alcohol/week: 0.0 standard drinks of alcohol   Drug use: Never       Family History  Problem Relation Age of Onset   Hypertension Mother    Heart disease Father    Hypertension Father    Heart attack Father      Allergies  Allergen Reactions    Amiodarone Other (See Comments)    Weakness, pulmonary opacities   Flomax [Tamsulosin Hcl]     REVIEW OF SYSTEMS (Negative unless checked)   Constitutional: [] Weight loss  [] Fever  [] Chills Cardiac: [] Chest pain   [] Chest pressure   [x] Palpitations   [] Shortness of breath when laying flat   [] Shortness of breath at rest   [] Shortness of breath with exertion. Vascular:  [x] Pain in legs with walking   [] Pain in legs at rest   [] Pain in legs when laying flat   [x] Claudication   [] Pain in feet when walking  [] Pain in feet at rest  [] Pain in feet when laying flat   [] History of DVT   [] Phlebitis   [] Swelling in legs   [] Varicose veins   [] Non-healing ulcers Pulmonary:   [] Uses home oxygen   [] Productive cough   [] Hemoptysis   [] Wheeze  [] COPD   [] Asthma Neurologic:  [] Dizziness  [] Blackouts   [] Seizures   [x] History of stroke   [] History of TIA  [] Aphasia   [] Temporary blindness   [] Dysphagia   [] Weakness or numbness in arms   [] Weakness or numbness in legs Musculoskeletal:  [x] Arthritis   [] Joint swelling   [] Joint pain   [] Low back pain Hematologic:  [] Easy bruising  [] Easy bleeding   [] Hypercoagulable state   [] Anemic  [] Hepatitis Gastrointestinal:  [] Blood in stool   [] Vomiting blood  [] Gastroesophageal reflux/heartburn   [] Abdominal pain Genitourinary:  [] Chronic kidney disease   [] Difficult urination  [] Frequent urination  [] Burning with urination   [] Hematuria Skin:  [] Rashes   [] Ulcers   [] Wounds Psychological:  [] History of anxiety   []  History of major depression.  Physical Examination  BP 103/74 (BP Location: Left Arm)   Pulse 75   Resp 16   Wt 169 lb 6.4 oz (76.8 kg)   BMI 23.63 kg/m  Gen:  WD/WN, NAD Head: [place]/AT, No temporalis wasting. Ear/Nose/Throat: Hearing grossly intact, nares w/o erythema or drainage Eyes: Conjunctiva clear. Sclera non-icteric Neck: Supple.  Trachea midline Pulmonary:  Good air movement, no use of accessory muscles.  Cardiac: RRR, no JVD Vascular:   Vessel Right Left  Radial Palpable Palpable                          PT 1+ Palpable 1+ Palpable  DP 1+ Palpable Trace Palpable   Gastrointestinal: soft, non-tender/non-distended. No guarding/reflex.  Musculoskeletal: M/S 5/5 throughout.  No deformity or atrophy. No edema. Neurologic: Sensation grossly intact in extremities.  Symmetrical.  Speech is fluent.  Psychiatric: Judgment intact, Mood & affect appropriate for pt's clinical situation. Dermatologic: No rashes or ulcers noted.  No cellulitis or open wounds.      Labs Recent Results (from the past 2160 hour(s))  Urinalysis, Complete     Status: Abnormal   Collection Time: 04/27/22  2:24 PM  Result Value Ref Range   Specific Gravity, UA 1.015 1.005 - 1.030   pH, UA 7.0 5.0 - 7.5   Color, UA Yellow Yellow   Appearance Ur Hazy (A) Clear   Leukocytes,UA Trace (A) Negative   Protein,UA 1+ (A) Negative/Trace   Glucose, UA Negative Negative   Ketones, UA Negative Negative   RBC, UA 3+ (A) Negative   Bilirubin, UA Negative Negative   Urobilinogen, Ur 0.2 0.2 - 1.0 mg/dL   Nitrite, UA Negative Negative   Microscopic Examination See below:   Microscopic Examination     Status: Abnormal   Collection Time: 04/27/22  2:24 PM   Urine  Result Value Ref Range   WBC, UA 6-10 (A) 0 - 5 /hpf   RBC, Urine >30 (A) 0 - 2 /hpf   Epithelial Cells (non renal) 0-10 0 - 10 /hpf   Mucus, UA Present (A) Not Estab.   Bacteria, UA Few None seen/Few    Radiology No results found.  Assessment/Plan Chronic systolic heart failure (HCC) Follows with cardiology.  On anticoagulation.  Hyperlipidemia lipid control important in reducing the progression of atherosclerotic disease. Continue statin therapy   Hypertension blood pressure control important in reducing the progression of atherosclerotic disease. On appropriate oral medications.   Atherosclerosis of native arteries of extremity with intermittent claudication (HCC) ABIs today  were mildly reduced at 0.96 on the right and 0.82 on the left.  Duplex showed 2 areas of hemodynamically significant stenosis on the right, 1 in the proximal SFA and 1 in the distal SFA.  The left SFA is occluded. Given his worsening symptoms on the right leg, I think proceeding with intervention is a reasonable option if he desires.  The patient strongly desirous of this would like [place] to plan an angiogram of the right lower extremity with intent to treat.  This would likely be SFA interventions and potentially tibial interventions.  I have discussed the pathophysiology and natural history of peripheral arterial disease.  Should he have a reasonably good result, in the future, we can certainly consider an angiogram and possible intervention on the left leg for his chronic occlusion of the SFA as well.  I discussed the risks and benefits of the procedure.  Patient voices understanding and desires to proceed with right lower extremity angiogram with possible intervention    Festus Barren, MD  06/16/2022 3:49 PM    This note was created with Dragon medical transcription system.  Any errors from dictation are purely unintentional

## 2022-06-16 NOTE — H&P (View-Only) (Signed)
  MRN : [id number]  Darius Miller is a 77 y.o. (05/12/1945) male who presents with chief complaint of  Chief Complaint  Patient presents with   Follow-up    Ultrasound follow up  .  History of Present Illness: Patient returns today in follow up of atherosclerosis with claudication symptoms.  The patient reports worsening claudication symptoms in both lower extremities.  His right bothers him more than the left.  He says his walking distances have shortened and he is more limited than he was previously.  He denies any open wounds or infection.  No current symptoms of rest pain.  ABIs today were mildly reduced at 0.96 on the right and 0.82 on the left.  Duplex showed 2 areas of hemodynamically significant stenosis on the right, 1 in the proximal SFA and 1 in the distal SFA.  The left SFA is occluded.  Current Outpatient Medications  Medication Sig Dispense Refill   allopurinol (ZYLOPRIM) 100 MG tablet Take 100 mg by mouth daily.     aspirin 81 MG tablet Take 1 tablet (81 mg total) by mouth daily. 30 tablet    [employer] D3 50 MCG (2000 UT) CAPS Take 2,000 Units by mouth daily.     dabigatran (PRADAXA) 150 MG CAPS capsule Take 1 capsule (150 mg total) by mouth 2 (two) times daily. 60 capsule    ELIQUIS 5 MG TABS tablet Take 5 mg by mouth 2 (two) times daily.     finasteride (PROSCAR) 5 MG tablet Take 1 tablet (5 mg total) by mouth daily. 30 tablet 3   fludrocortisone (FLORINEF) 0.1 MG tablet Take 100 mcg by mouth daily.     furosemide (LASIX) 20 MG tablet Take 20 mg by mouth daily.     HYDROcodone-acetaminophen (NORCO/VICODIN) 5-325 MG tablet Take 1 tablet by mouth every 6 (six) hours as needed for moderate pain. 10 tablet 0   ipratropium (ATROVENT) 0.03 % nasal spray Place 1-2 sprays into both nostrils 2 (two) times daily as needed for rhinitis.     metoprolol succinate (TOPROL-XL) 25 MG 24 hr tablet Take 1 tablet (25 mg total) by mouth 2 (two) times daily. This is an increase from 12.5 mg twice  daily. 60 tablet 0   nitroGLYCERIN (NITROSTAT) 0.4 MG SL tablet Place 1 tablet (0.4 mg total) under the tongue every 5 (five) minutes as needed for chest pain.  12   Potassium Chloride ER 20 MEQ TBCR Take 40 mEq by mouth daily.     rosuvastatin (CRESTOR) 10 MG tablet Take 10 mg by mouth daily.     TRELEGY ELLIPTA 100-62.5-25 MCG/INH AEPB Inhale 1 puff into the lungs daily as needed (respiratory problems).     venlafaxine XR (EFFEXOR-XR) 75 MG 24 hr capsule Take 75 mg by mouth daily.      vitamin B-12 (CYANOCOBALAMIN) 1000 MCG tablet Take 1,000 mcg by mouth daily.     atorvastatin (LIPITOR) 20 MG tablet Take 20 mg by mouth daily. (Patient not taking: Reported on 06/16/2022)  3   mexiletine (MEXITIL) 200 MG capsule Take 1 capsule (200 mg total) by mouth 3 (three) times daily. This is an increase from 2 times daily. 90 capsule 0   No current facility-administered medications for this visit.    Past Medical History:  Diagnosis Date   Atrial fibrillation    Coronary artery disease  s/p RCA stents    History of mitral valve repair    Hypertension    ICD (implantable cardioverter-defibrillator), biventricular,   Medtronic NO  atrial lead    Kidney stones    Stroke    Ventricular tachycardia     Past Surgical History:  Procedure Laterality Date   CARDIAC CATHETERIZATION     CORONARY ANGIOPLASTY     Coronary stent 2009  2009   ICD IMPLANT     MITRAL VALVE REPAIR  October 16, 2014     Social History   Tobacco Use   Smoking status: Former    Packs/day: 2.00    Years: 20.00    Additional pack years: 0.00    Total pack years: 40.00    Types: Cigarettes   Smokeless tobacco: Never  Substance Use Topics   Alcohol use: Not Currently    Alcohol/week: 0.0 standard drinks of alcohol   Drug use: Never       Family History  Problem Relation Age of Onset   Hypertension Mother    Heart disease Father    Hypertension Father    Heart attack Father      Allergies  Allergen Reactions    Amiodarone Other (See Comments)    Weakness, pulmonary opacities   Flomax [Tamsulosin Hcl]     REVIEW OF SYSTEMS (Negative unless checked)   Constitutional: []Weight loss  []Fever  []Chills Cardiac: []Chest pain   []Chest pressure   [x]Palpitations   []Shortness of breath when laying flat   []Shortness of breath at rest   []Shortness of breath with exertion. Vascular:  [x]Pain in legs with walking   []Pain in legs at rest   []Pain in legs when laying flat   [x]Claudication   []Pain in feet when walking  []Pain in feet at rest  []Pain in feet when laying flat   []History of DVT   []Phlebitis   []Swelling in legs   []Varicose veins   []Non-healing ulcers Pulmonary:   []Uses home oxygen   []Productive cough   []Hemoptysis   []Wheeze  []COPD   []Asthma Neurologic:  []Dizziness  []Blackouts   []Seizures   [x]History of stroke   []History of TIA  []Aphasia   []Temporary blindness   []Dysphagia   []Weakness or numbness in arms   []Weakness or numbness in legs Musculoskeletal:  [x]Arthritis   []Joint swelling   []Joint pain   []Low back pain Hematologic:  []Easy bruising  []Easy bleeding   []Hypercoagulable state   []Anemic  []Hepatitis Gastrointestinal:  []Blood in stool   []Vomiting blood  []Gastroesophageal reflux/heartburn   []Abdominal pain Genitourinary:  []Chronic kidney disease   []Difficult urination  []Frequent urination  []Burning with urination   []Hematuria Skin:  []Rashes   []Ulcers   []Wounds Psychological:  []History of anxiety   [] History of major depression.  Physical Examination  BP 103/74 (BP Location: Left Arm)   Pulse 75   Resp 16   Wt 169 lb 6.4 oz (76.8 kg)   BMI 23.63 kg/m  Gen:  WD/WN, NAD Head: [place]/AT, No temporalis wasting. Ear/Nose/Throat: Hearing grossly intact, nares w/o erythema or drainage Eyes: Conjunctiva clear. Sclera non-icteric Neck: Supple.  Trachea midline Pulmonary:  Good air movement, no use of accessory muscles.  Cardiac: RRR, no JVD Vascular:   Vessel Right Left  Radial Palpable Palpable                          PT 1+ Palpable 1+ Palpable  DP 1+ Palpable Trace Palpable   Gastrointestinal: soft, non-tender/non-distended. No guarding/reflex.  Musculoskeletal: M/S 5/5 throughout.    No deformity or atrophy. No edema. Neurologic: Sensation grossly intact in extremities.  Symmetrical.  Speech is fluent.  Psychiatric: Judgment intact, Mood & affect appropriate for pt's clinical situation. Dermatologic: No rashes or ulcers noted.  No cellulitis or open wounds.      Labs Recent Results (from the past 2160 hour(s))  Urinalysis, Complete     Status: Abnormal   Collection Time: 04/27/22  2:24 PM  Result Value Ref Range   Specific Gravity, UA 1.015 1.005 - 1.030   pH, UA 7.0 5.0 - 7.5   Color, UA Yellow Yellow   Appearance Ur Hazy (A) Clear   Leukocytes,UA Trace (A) Negative   Protein,UA 1+ (A) Negative/Trace   Glucose, UA Negative Negative   Ketones, UA Negative Negative   RBC, UA 3+ (A) Negative   Bilirubin, UA Negative Negative   Urobilinogen, Ur 0.2 0.2 - 1.0 mg/dL   Nitrite, UA Negative Negative   Microscopic Examination See below:   Microscopic Examination     Status: Abnormal   Collection Time: 04/27/22  2:24 PM   Urine  Result Value Ref Range   WBC, UA 6-10 (A) 0 - 5 /hpf   RBC, Urine >30 (A) 0 - 2 /hpf   Epithelial Cells (non renal) 0-10 0 - 10 /hpf   Mucus, UA Present (A) Not Estab.   Bacteria, UA Few None seen/Few    Radiology No results found.  Assessment/Plan Chronic systolic heart failure (HCC) Follows with cardiology.  On anticoagulation.  Hyperlipidemia lipid control important in reducing the progression of atherosclerotic disease. Continue statin therapy   Hypertension blood pressure control important in reducing the progression of atherosclerotic disease. On appropriate oral medications.   Atherosclerosis of native arteries of extremity with intermittent claudication (HCC) ABIs today  were mildly reduced at 0.96 on the right and 0.82 on the left.  Duplex showed 2 areas of hemodynamically significant stenosis on the right, 1 in the proximal SFA and 1 in the distal SFA.  The left SFA is occluded. Given his worsening symptoms on the right leg, I think proceeding with intervention is a reasonable option if he desires.  The patient strongly desirous of this would like us to plan an angiogram of the right lower extremity with intent to treat.  This would likely be SFA interventions and potentially tibial interventions.  I have discussed the pathophysiology and natural history of peripheral arterial disease.  Should he have a reasonably good result, in the future, we can certainly consider an angiogram and possible intervention on the left leg for his chronic occlusion of the SFA as well.  I discussed the risks and benefits of the procedure.  Patient voices understanding and desires to proceed with right lower extremity angiogram with possible intervention    Elba Dendinger, MD  06/16/2022 3:49 PM    This note was created with Dragon medical transcription system.  Any errors from dictation are purely unintentional 

## 2022-06-16 NOTE — Patient Instructions (Signed)
Angiogram  An angiogram is a procedure used to examine the blood vessels. In this procedure, contrast dye is injected through a soft tube (catheter) into an artery. X-rays are then taken, which show if there is a blockage or problem in a blood vessel. The catheter may be inserted in: The groin area. This is the most common. The fold of the arm, near the elbow. The wrist. Tell a health care provider about: Any allergies you have, including allergies to medicines, shellfish, contrast dye, or iodine. All medicines you are taking, including vitamins, herbs, eye drops, creams, and over-the-counter medicines. Any problems you or family members have had with anesthesia. Any bleeding problems you have. Any surgeries you have had. Any medical conditions you have or have had, including any kidney problems or kidney failure. Whether you are pregnant or may be pregnant. Whether you are breastfeeding. Any condition that may increase your stress and prevent you from lying still. This includes anxiety disorders or chronic pain. What are the risks? Your health care provider will talk with you about risks. These may include: Infection. Bleeding and bruising. Allergic reactions to medicines or dyes, including the contrast dye used. Damage to nearby structures or organs, including damage to blood vessels and kidney damage from the contrast dye. Blood clots that can lead to a stroke or heart attack. Death. What happens before the procedure? When to stop eating and drinking Follow instructions from your health care provider about what you may eat and drink before your procedure. These may include: 8 hours before your procedure Stop eating most foods. Do not eat meat, fried foods, or fatty foods. Eat only light foods, such as toast or crackers. All liquids are okay except energy drinks and alcohol. 6 hours before your procedure Stop eating. Drink only clear liquids, such as water, clear fruit juice,  black coffee, plain tea, and sports drinks. Do not drink energy drinks or alcohol. 2 hours before your procedure Stop drinking all liquids. You may be allowed to take medicines with small sips of water. If you do not follow your health care provider's instructions, your procedure may be delayed or canceled. Medicines Ask your health care provider about: Changing or stopping your regular medicines. These include any diabetes medicines or blood thinners you take. Taking medicines such as aspirin and ibuprofen. These medicines can thin your blood. Do not take them unless your health care provider tells you to. Taking over-the-counter medicines, vitamins, herbs, and supplements. Surgery safety Ask your health care provider: How your insertion site will be marked. What steps will be taken to help prevent infection. These may include: Removing hair at the insertion site. Washing skin with a germ-killing soap. General instructions Do not use any products that contain nicotine or tobacco for at least 4 weeks before the procedure. These products include cigarettes, chewing tobacco, and vaping devices, such as e-cigarettes. If you need help quitting, ask your health care provider. You may have blood samples taken. If you will be going home right after the procedure, plan to have a responsible adult: Take you home from the hospital or clinic. You will not be allowed to drive. Care for you for the time you are told. What happens during the procedure? You will lie on your back on an X-ray table. You may be strapped to the table if it is tilted. An IV will be inserted into one of your veins. Electrodes may be placed on your chest to monitor your heart rate during the procedure.  You will be given one or both of the following: A sedative. This helps you relax. Anesthesia. This will numb the area where the catheter will be inserted. A small incision will be made for catheter insertion. The catheter  will be inserted into an artery using a guide wire. An X-ray procedure (fluoroscopy) will be used to help guide the catheter to the blood vessel to be examined. A contrast dye will then be injected into the catheter, and X-rays will be taken. The contrast will help to show where any narrowing or blockages are located in the blood vessels. You may feel flushed as the contrast dye is injected. Tell your health care provider if you develop chest pain or trouble breathing. After the fluoroscopy is complete, the catheter will be removed. Pressure will be applied to stop bleeding. A closure device may also be used. A bandage (dressing) will be placed over the site where the catheter was inserted. The procedure may vary among health care providers and hospitals. What happens after the procedure? Your blood pressure, heart rate, breathing rate, and blood oxygen level will be monitored until you leave the hospital or clinic. You will be kept in bed lying flat for a period of time. If the catheter was inserted through your leg, you will be instructed not to bend or cross your legs. The insertion area and the pulse in your feet or wrist will be checked often. You will be instructed to drink plenty of fluids. This will help wash the contrast dye out of your body. You may have more blood tests and X-rays. You may also have a test that records the electrical activity of your heart (electrocardiogram, or ECG). If you were given a sedative, do not drive or operate machinery until your health care provider says that it is safe. You may have to avoid lifting. Ask your health care provider how much you can safely lift. It is up to you to get your test results. Ask your health care provider, or the department that is doing the test, when your results will be ready. This information is not intended to replace advice given to you by your health care provider. Make sure you discuss any questions you have with your health  care provider. Document Revised: 09/17/2021 Document Reviewed: 09/17/2021 Elsevier Patient Education  2023 [employer].

## 2022-06-16 NOTE — Assessment & Plan Note (Signed)
lipid control important in reducing the progression of atherosclerotic disease. Continue statin therapy  

## 2022-06-18 ENCOUNTER — Telehealth (INDEPENDENT_AMBULATORY_CARE_PROVIDER_SITE_OTHER): Payer: Self-pay

## 2022-06-18 LAB — VAS US ABI WITH/WO TBI
Left ABI: 0.82
Right ABI: 0.96

## 2022-06-18 NOTE — Telephone Encounter (Signed)
I attempted to contact the patient to schedule him for a RLE angio with Dr. Wyn Quaker. I was unable to leave a message as his voicemail was full. I contacted the spouse and she stated he was sleeping and would call back.

## 2022-06-18 NOTE — Telephone Encounter (Signed)
Patient returned my call and is scheduled with Dr. Wyn Quaker on 06/29/22 with a 6:45 am arrival time to the [facility] for a RLE angio. Pre-procedure instructions were discussed and will be sent to Mychart and mailed.

## 2022-06-25 NOTE — Telephone Encounter (Signed)
ERROR

## 2022-06-29 ENCOUNTER — Other Ambulatory Visit: Payer: Self-pay

## 2022-06-29 ENCOUNTER — Encounter: Payer: Self-pay | Admitting: Vascular Surgery

## 2022-06-29 ENCOUNTER — Encounter
Admission: RE | Disposition: A | Discharge status: Home / Self Care | Payer: Self-pay | Source: Home / Self Care | Attending: Vascular Surgery

## 2022-06-29 ENCOUNTER — Ambulatory Visit
Admission: RE | Admit: 2022-06-29 | Discharge: 2022-06-29 | Disposition: A | Discharge status: Home / Self Care | Payer: Medicare HMO | Attending: Vascular Surgery | Admitting: Vascular Surgery

## 2022-06-29 DIAGNOSIS — I70211 Atherosclerosis of native arteries of extremities with intermittent claudication, right leg: Secondary | ICD-10-CM | POA: Diagnosis not present

## 2022-06-29 DIAGNOSIS — E785 Hyperlipidemia, unspecified: Secondary | ICD-10-CM | POA: Diagnosis not present

## 2022-06-29 DIAGNOSIS — I110 Hypertensive heart disease with heart failure: Secondary | ICD-10-CM | POA: Insufficient documentation

## 2022-06-29 DIAGNOSIS — I5022 Chronic systolic (congestive) heart failure: Secondary | ICD-10-CM | POA: Diagnosis not present

## 2022-06-29 DIAGNOSIS — I70219 Atherosclerosis of native arteries of extremities with intermittent claudication, unspecified extremity: Secondary | ICD-10-CM

## 2022-06-29 DIAGNOSIS — Z87891 Personal history of nicotine dependence: Secondary | ICD-10-CM | POA: Diagnosis not present

## 2022-06-29 HISTORY — PX: LOWER EXTREMITY ANGIOGRAPHY: CATH118251

## 2022-06-29 LAB — BUN: BUN: 19 mg/dL (ref 8–23)

## 2022-06-29 LAB — CREATININE, SERUM
Creatinine, Ser: 1.25 mg/dL — ABNORMAL HIGH (ref 0.61–1.24)
GFR, Estimated: 60 mL/min — ABNORMAL LOW (ref >60)

## 2022-06-29 SURGERY — LOWER EXTREMITY ANGIOGRAPHY
Anesthesia: Moderate Sedation | Site: Leg Lower | Laterality: Right

## 2022-06-29 MED ORDER — HYDRALAZINE HCL 20 MG/ML IJ SOLN: 5.0000 mg | INTRAMUSCULAR | Status: DC | PRN

## 2022-06-29 MED ORDER — ACETAMINOPHEN 325 MG PO TABS: 650.0000 mg | ORAL_TABLET | ORAL | Status: DC | PRN

## 2022-06-29 MED ORDER — SODIUM CHLORIDE 0.9% FLUSH: 3.0000 mL | Freq: Two times a day (BID) | INTRAVENOUS | Status: DC

## 2022-06-29 MED ORDER — METHYLPREDNISOLONE SODIUM SUCC 125 MG IJ SOLR: 125.0000 mg | Freq: Once | INTRAMUSCULAR | Status: DC | PRN

## 2022-06-29 MED ORDER — HEPARIN SODIUM (PORCINE) 1000 UNIT/ML IJ SOLN
INTRAMUSCULAR | Status: DC | PRN
  Administered 2022-06-29: 5000 [IU] via INTRAVENOUS

## 2022-06-29 MED ORDER — DIPHENHYDRAMINE HCL 50 MG/ML IJ SOLN: 50.0000 mg | Freq: Once | INTRAMUSCULAR | Status: DC | PRN

## 2022-06-29 MED ORDER — MIDAZOLAM HCL 2 MG/2ML IJ SOLN
INTRAMUSCULAR | Status: AC
  Filled 2022-06-29: qty 2

## 2022-06-29 MED ORDER — SODIUM CHLORIDE 0.9 % IV SOLN: INTRAVENOUS | Status: DC

## 2022-06-29 MED ORDER — ONDANSETRON HCL 4 MG/2ML IJ SOLN: 4.0000 mg | Freq: Four times a day (QID) | INTRAMUSCULAR | Status: DC | PRN

## 2022-06-29 MED ORDER — NITROGLYCERIN 1 MG/10 ML FOR IR/CATH LAB
INTRA_ARTERIAL | Status: AC
  Filled 2022-06-29: qty 10

## 2022-06-29 MED ORDER — DIPHENHYDRAMINE HCL 50 MG/ML IJ SOLN
INTRAMUSCULAR | Status: AC
  Filled 2022-06-29: qty 1

## 2022-06-29 MED ORDER — MIDAZOLAM HCL 2 MG/ML PO SYRP: 8.0000 mg | ORAL_SOLUTION | Freq: Once | ORAL | Status: DC | PRN

## 2022-06-29 MED ORDER — HYDROMORPHONE HCL 1 MG/ML IJ SOLN: 1.0000 mg | Freq: Once | INTRAMUSCULAR | Status: DC | PRN

## 2022-06-29 MED ORDER — FENTANYL CITRATE (PF) 100 MCG/2ML IJ SOLN
INTRAMUSCULAR | Status: AC
  Filled 2022-06-29: qty 2

## 2022-06-29 MED ORDER — MIDAZOLAM HCL 2 MG/2ML IJ SOLN
INTRAMUSCULAR | Status: DC | PRN
  Administered 2022-06-29: 2 mg via INTRAVENOUS
  Administered 2022-06-29: 1 mg via INTRAVENOUS

## 2022-06-29 MED ORDER — SODIUM CHLORIDE 0.9 % IV SOLN: 250.0000 mL | INTRAVENOUS | Status: DC | PRN

## 2022-06-29 MED ORDER — LABETALOL HCL 5 MG/ML IV SOLN: 10.0000 mg | INTRAVENOUS | Status: DC | PRN

## 2022-06-29 MED ORDER — CEFAZOLIN SODIUM-DEXTROSE 2-4 GM/100ML-% IV SOLN
INTRAVENOUS | Status: AC
  Filled 2022-06-29: qty 100

## 2022-06-29 MED ORDER — HEPARIN SODIUM (PORCINE) 1000 UNIT/ML IJ SOLN
INTRAMUSCULAR | Status: AC
  Filled 2022-06-29: qty 10

## 2022-06-29 MED ORDER — FENTANYL CITRATE PF 50 MCG/ML IJ SOSY
PREFILLED_SYRINGE | INTRAMUSCULAR | Status: AC
  Filled 2022-06-29: qty 1

## 2022-06-29 MED ORDER — NITROGLYCERIN 1 MG/10 ML FOR IR/CATH LAB
INTRA_ARTERIAL | Status: DC | PRN
  Administered 2022-06-29: 300 ug via INTRA_ARTERIAL

## 2022-06-29 MED ORDER — CEFAZOLIN SODIUM-DEXTROSE 2-4 GM/100ML-% IV SOLN
2.0000 g | INTRAVENOUS | Status: AC
  Administered 2022-06-29: 2 g via INTRAVENOUS

## 2022-06-29 MED ORDER — SODIUM CHLORIDE 0.9% FLUSH: 3.0000 mL | INTRAVENOUS | Status: DC | PRN

## 2022-06-29 MED ORDER — FENTANYL CITRATE (PF) 100 MCG/2ML IJ SOLN
INTRAMUSCULAR | Status: DC | PRN
  Administered 2022-06-29 (×2): 50 ug via INTRAVENOUS
  Administered 2022-06-29: 25 ug via INTRAVENOUS

## 2022-06-29 MED ORDER — DIPHENHYDRAMINE HCL 50 MG/ML IJ SOLN
INTRAMUSCULAR | Status: DC | PRN
  Administered 2022-06-29: 25 mg via INTRAVENOUS

## 2022-06-29 MED ORDER — FAMOTIDINE 20 MG PO TABS: 40.0000 mg | ORAL_TABLET | Freq: Once | ORAL | Status: DC | PRN

## 2022-06-29 SURGICAL SUPPLY — 27 items
BALLN LUTONIX 018 4X100X130 (BALLOONS) ×1
BALLN LUTONIX 018 5X150X130 (BALLOONS) ×1
BALLN LUTONIX 018 5X220X130 (BALLOONS) ×1
BALLN LUTONIX 018 5X300X130 (BALLOONS) ×1
BALLN LUTONIX 018 6X220X130 (BALLOONS) ×1
BALLN ULTRVRSE 018 2.5X100X150 (BALLOONS) ×1
BALLOON LUTONIX 018 4X100X130 (BALLOONS) IMPLANT
BALLOON LUTONIX 018 5X150X130 (BALLOONS) IMPLANT
BALLOON LUTONIX 018 5X220X130 (BALLOONS) IMPLANT
BALLOON LUTONIX 018 5X300X130 (BALLOONS) IMPLANT
BALLOON LUTONIX 018 6X220X130 (BALLOONS) IMPLANT
BALLOON ULTRVS 018 2.5X100X150 (BALLOONS) IMPLANT
CATH ANGIO 5F PIGTAIL 65CM (CATHETERS) IMPLANT
CATH VERT 5X100 (CATHETERS) IMPLANT
COVER PROBE ULTRASOUND 5X96 (MISCELLANEOUS) IMPLANT
DEVICE STARCLOSE SE CLOSURE (Vascular Products) IMPLANT
GLIDEWIRE ADV .035X260CM (WIRE) IMPLANT
KIT ENCORE 26 ADVANTAGE (KITS) IMPLANT
PACK ANGIOGRAPHY (CUSTOM PROCEDURE TRAY) ×1 IMPLANT
SHEATH ANL2 6FRX45 HC (SHEATH) IMPLANT
SHEATH BRITE TIP 5FRX11 (SHEATH) IMPLANT
STENT LIFESTENT 6X170X130 (Permanent Stent) IMPLANT
STENT VIABAHN 6X150X120 (Permanent Stent) IMPLANT
SYR MEDRAD MARK 7 150ML (SYRINGE) IMPLANT
TUBING CONTRAST HIGH PRESS 72 (TUBING) IMPLANT
WIRE G V18X300CM (WIRE) IMPLANT
WIRE GUIDERIGHT .035X150 (WIRE) IMPLANT

## 2022-06-29 NOTE — Op Note (Signed)
[facility]  Percutaneous Study/Intervention Procedural Note   Date of Surgery: 06/29/2022  Surgeon(s):Durant Scibilia    Assistants:none  Pre-operative Diagnosis: PAD with claudication RLE  Post-operative diagnosis:  Same  Procedure(s) Performed:             1.  Ultrasound guidance for vascular access left femoral artery             2.  Catheter placement into right common femoral artery from left femoral approach             3.  Aortogram and selective right lower extremity angiogram             4.  Percutaneous transluminal angioplasty of right peroneal artery with 2.5 mm diameter angioplasty balloon             5.  Percutaneous transluminal angioplasty of right tibioperoneal trunk and distal popliteal artery with 4 mm diameter Lutonix drug-coated angioplasty balloon  6.  Percutaneous transluminal angioplasty of the right SFA and popliteal arteries with 5 mm diameter by 30 and 5 mm diameter by 22 cm length Lutonix drug-coated angioplasty balloons  7.  Stent placement x 2 to the right SFA and most proximal popliteal artery with 6 mm diameter by 15 cm length Viabahn stent and 6 mm diameter by 17 cm length life stent             8.  StarClose closure device left femoral artery  EBL: 10 cc  Contrast: 65 cc  Fluoro Time: 8 minutes  Moderate Conscious Sedation Time: approximately 67 minutes using 2 mg of Versed and 100 mcg of Fentanyl              Indications:  Patient is a 77 y.o.male with disabling claudication symptoms of the right lower extremity. The patient has noninvasive study showing reduced ABI with multiple areas of stenosis on duplex. The patient is brought in for angiography for further evaluation and potential treatment.  Risks and benefits are discussed and informed consent is obtained.   Procedure:  The patient was identified and appropriate procedural time out was performed.  The patient was then placed supine on the table and prepped and draped in  the usual sterile fashion. Moderate conscious sedation was administered during a face to face encounter with the patient throughout the procedure with my supervision of the RN administering medicines and monitoring the patient's vital signs, pulse oximetry, telemetry and mental status throughout from the start of the procedure until the patient was taken to the recovery room. Ultrasound was used to evaluate the left common femoral artery.  It was patent .  A digital ultrasound image was acquired.  A Seldinger needle was used to access the left common femoral artery under direct ultrasound guidance and a permanent image was performed.  A 0.035 J wire was advanced without resistance and a 5Fr sheath was placed.  Pigtail catheter was placed into the aorta and an AP aortogram was performed. This demonstrated normal renal arteries and normal aorta and iliac segments without significant stenosis. I then crossed the aortic bifurcation and advanced to the right femoral head. Selective right lower extremity angiogram was then performed. This demonstrated fairly normal common femoral artery with a small and somewhat diseased profunda femoris artery.  The SFA and popliteal arteries were diffusely diseased with multiple areas of greater than 80% stenosis throughout.  The mid popliteal artery had about a 90% stenosis.  The tibioperoneal trunk had about a 70%  stenosis.  The proximal peroneal artery had about a 90% stenosis.  The posterior tibial artery was large and continuous to the foot.  The anterior tibial artery was chronically occluded without distal reconstitution. It was felt that it was in the patient's best interest to proceed with intervention after these images to avoid a second procedure and a larger amount of contrast and fluoroscopy based off of the findings from the initial angiogram. The patient was systemically heparinized and a 6 [place] Ansell sheath was then placed over the [employer] wire. I then used a  Kumpe catheter and the advantage wire to navigate through the SFA and popliteal lesions without difficulty.  Once I got down to the tibioperoneal trunk, I exchanged for a V18 wire and cross the lesion in the tibioperoneal trunk and the additional lesion in the proximal peroneal artery without difficulty and parked the wire at the ankle.  I then proceeded with treatment of the multiple levels of stenosis.  The peroneal artery was addressed with a 2.5 mm diameter by 10 cm length angioplasty balloon inflated to 10 atm for 1 minute.  The tibioperoneal trunk and most distal popliteal artery were addressed with a 4 mm diameter by 10 cm length Lutonix drug-coated angioplasty balloon inflated to 8 atm for 1 minute.  The SFA throughout its entirety in the popliteal artery to the midsegment were then addressed with a 5 mm diameter by 30 and a 5 mm diameter by 22 cm length Lutonix drug-coated angioplasty balloon.  These inflations were 6 to 8 atm for 1 minute.  Completion imaging showed multiple areas in the SFA and most proximal popliteal artery with greater than 50% residual stenosis that needed stents.  The mid and distal popliteal artery had less than 20% stenosis.  The tibioperoneal trunk had about a 10% stenosis in the proximal peroneal artery had about a 15 to 20% stenosis.  I elected to place a Viabahn stent distally and the more flexible portion at Hunter's canal and a 6 mm diameter by 15 cm length Viabahn stent was deployed in this location.  In the proximal to mid SFA, there were more collaterals and I placed a 6 mm diameter by 17 cm length life stent in this location.  These were postdilated with 5 mm diameter Lutonix drug-coated balloon distally and 6 mm diameter Lutonix drug-coated balloon proximally.  Completion imaging showed marked improvement with less than 10% residual stenosis after stent placement. I elected to terminate the procedure. The sheath was removed and StarClose closure device was deployed in the  left femoral artery with excellent hemostatic result. The patient was taken to the recovery room in stable condition having tolerated the procedure well.  Findings:               Aortogram:  This demonstrated normal renal arteries and normal aorta and iliac segments without significant stenosis.             Right Lower Extremity:  This demonstrated fairly normal common femoral artery with a small and somewhat diseased profunda femoris artery.  The SFA and popliteal arteries were diffusely diseased with multiple areas of greater than 80% stenosis throughout.  The mid popliteal artery had about a 90% stenosis.  The tibioperoneal trunk had about a 70% stenosis.  The proximal peroneal artery had about a 90% stenosis.  The posterior tibial artery was large and continuous to the foot.  The anterior tibial artery was chronically occluded without distal reconstitution.   Disposition: Patient  was taken to the recovery room in stable condition having tolerated the procedure well.  Complications: None  Festus Barren 06/29/2022 9:40 AM   This note was created with Dragon Medical transcription system. Any errors in dictation are purely unintentional.

## 2022-06-29 NOTE — Progress Notes (Signed)
Dr. Wyn Quaker at bedside, speaking with pt. And his wife re: procedural results. Both verbalized understanding of conversation. MD states pt. May resume Eliquis this evening: pt. Verbally agrees to restart tonight.

## 2022-06-29 NOTE — Interval H&P Note (Signed)
History and Physical Interval Note:  06/29/2022 8:11 AM  Darius Miller  has presented today for surgery, with the diagnosis of RLE Angio  BARD   ASO w claudication.  The various methods of treatment have been discussed with the patient and family. After consideration of risks, benefits and other options for treatment, the patient has consented to  Procedure(s): Lower Extremity Angiography (Right) as a surgical intervention.  The patient's history has been reviewed, patient examined, no change in status, stable for surgery.  I have reviewed the patient's chart and labs.  Questions were answered to the patient's satisfaction.     Festus Barren

## 2022-07-24 ENCOUNTER — Other Ambulatory Visit (INDEPENDENT_AMBULATORY_CARE_PROVIDER_SITE_OTHER): Payer: Self-pay | Admitting: Vascular Surgery

## 2022-07-24 DIAGNOSIS — I739 Peripheral vascular disease, unspecified: Secondary | ICD-10-CM

## 2022-07-29 ENCOUNTER — Ambulatory Visit (INDEPENDENT_AMBULATORY_CARE_PROVIDER_SITE_OTHER): Payer: Medicare HMO

## 2022-07-29 ENCOUNTER — Ambulatory Visit (INDEPENDENT_AMBULATORY_CARE_PROVIDER_SITE_OTHER): Payer: Medicare HMO | Admitting: Nurse Practitioner

## 2022-07-29 ENCOUNTER — Encounter (INDEPENDENT_AMBULATORY_CARE_PROVIDER_SITE_OTHER): Payer: Self-pay | Admitting: Nurse Practitioner

## 2022-07-29 VITALS — BP 115/83 | HR 80 | Resp 16 | Wt 167.6 lb

## 2022-07-29 DIAGNOSIS — I70213 Atherosclerosis of native arteries of extremities with intermittent claudication, bilateral legs: Secondary | ICD-10-CM | POA: Diagnosis not present

## 2022-07-29 DIAGNOSIS — Z9889 Other specified postprocedural states: Secondary | ICD-10-CM

## 2022-07-29 DIAGNOSIS — I739 Peripheral vascular disease, unspecified: Secondary | ICD-10-CM | POA: Diagnosis not present

## 2022-07-29 DIAGNOSIS — I10 Essential (primary) hypertension: Secondary | ICD-10-CM | POA: Diagnosis not present

## 2022-07-29 DIAGNOSIS — E785 Hyperlipidemia, unspecified: Secondary | ICD-10-CM | POA: Diagnosis not present

## 2022-07-30 ENCOUNTER — Encounter (INDEPENDENT_AMBULATORY_CARE_PROVIDER_SITE_OTHER): Payer: Self-pay | Admitting: Nurse Practitioner

## 2022-07-30 LAB — VAS US ABI WITH/WO TBI
Left ABI: 0.92
Right ABI: 1.15

## 2022-07-30 NOTE — Progress Notes (Signed)
Subjective:    Patient ID: Darius Miller, male    DOB: Mar 16, 1945, 77 y.o.   MRN: [id number] Chief Complaint  Patient presents with   Follow-up    [facility] 4 week with ABI    The patient returns to the office for followup and review status post angiogram with intervention on 06/29/2022.   Procedure: Procedure(s) Performed:             1.  Ultrasound guidance for vascular access left femoral artery             2.  Catheter placement into right common femoral artery from left femoral approach             3.  Aortogram and selective right lower extremity angiogram             4.  Percutaneous transluminal angioplasty of right peroneal artery with 2.5 mm diameter angioplasty balloon             5.  Percutaneous transluminal angioplasty of right tibioperoneal trunk and distal popliteal artery with 4 mm diameter Lutonix drug-coated angioplasty balloon             6.  Percutaneous transluminal angioplasty of the right SFA and popliteal arteries with 5 mm diameter by 30 and 5 mm diameter by 22 cm length Lutonix drug-coated angioplasty balloons             7.  Stent placement x 2 to the right SFA and most proximal popliteal artery with 6 mm diameter by 15 cm length Viabahn stent and 6 mm diameter by 17 cm length life stent             8.  StarClose closure device left femoral artery   The patient notes improvement in the lower extremity symptoms. No interval shortening of the patient's claudication distance or rest pain symptoms. No new ulcers or wounds have occurred since the last visit.  The claudication symptoms of the right are much improved but he still has some in the involved.  However he recently injured his leg has a large hematoma near the ankle area this may be contributing  There have been no significant changes to the patient's overall health care.  No documented history of amaurosis fugax or recent TIA symptoms. There are no recent neurological changes noted. No documented history of  DVT, PE or superficial thrombophlebitis. The patient denies recent episodes of angina or shortness of breath.   ABI's Rt=1.15 and Lt=0.92  (previous ABI's Rt=0.96 and Lt=0.82) Duplex US of the bilateral tibial vessels reveals biphasic waveforms with slightly dampened bilaterally. Big improvement from previous studies with TBI 0 bilaterally.    Review of Systems  All other systems reviewed and are negative.      Objective:   Physical Exam Vitals reviewed.  HENT:     Head: Normocephalic.  Cardiovascular:     Rate and Rhythm: Normal rate.     Pulses:          Dorsalis pedis pulses are detected w/ Doppler on the right side and detected w/ Doppler on the left side.       Posterior tibial pulses are detected w/ Doppler on the right side and detected w/ Doppler on the left side.  Pulmonary:     Effort: Pulmonary effort is normal.  Skin:    General: Skin is warm and dry.  Neurological:     Mental Status: He is alert and oriented to person, place,  and time.  Psychiatric:        Mood and Affect: Mood normal.        Behavior: Behavior normal.        Thought Content: Thought content normal.        Judgment: Judgment normal.     BP 115/83 (BP Location: Left Arm)   Pulse 80   Resp 16   Wt 167 lb 9.6 oz (76 kg)   BMI 23.38 kg/m   Past Medical History:  Diagnosis Date   Atrial fibrillation (HCC)    Coronary artery disease  s/p RCA stents    History of mitral valve repair    Hypertension    ICD (implantable cardioverter-defibrillator), biventricular, Medtronic NO  atrial lead    Kidney stones    Stroke [facility])    Ventricular tachycardia (HCC)     Social History   Socioeconomic History   Marital status: Single    Spouse name: Not on file   Number of children: Not on file   Years of education: Not on file   Highest education level: Not on file  Occupational History   Not on file  Tobacco Use   Smoking status: Former    Packs/day: 2.00    Years: 20.00    Additional  pack years: 0.00    Total pack years: 40.00    Types: Cigarettes    Quit date: 25    Years since quitting: 44.4   Smokeless tobacco: Never  Substance and Sexual Activity   Alcohol use: Not Currently    Alcohol/week: 0.0 standard drinks of alcohol   Drug use: Never   Sexual activity: Not on file  Other Topics Concern   Not on file  Social History Narrative   Not on file   Social Determinants of Health   Financial Resource Strain: Not on file  Food Insecurity: Not on file  Transportation Needs: Not on file  Physical Activity: Not on file  Stress: Not on file  Social Connections: Not on file  Intimate Partner Violence: Not on file    Past Surgical History:  Procedure Laterality Date   CARDIAC CATHETERIZATION     CORONARY ANGIOPLASTY     Coronary stent 2009  2009   ICD IMPLANT     LOWER EXTREMITY ANGIOGRAPHY Right 06/29/2022   Procedure: Lower Extremity Angiography;  Surgeon: Annice Needy, MD;  Location: [facility] INVASIVE CV LAB;  Service: Cardiovascular;  Laterality: Right;   MITRAL VALVE REPAIR  October 16, 2014    Family History  Problem Relation Age of Onset   Hypertension Mother    Heart disease Father    Hypertension Father    Heart attack Father     Allergies  Allergen Reactions   Doxycycline Other (See Comments)    Blurred vision   Amiodarone Other (See Comments)    Weakness, pulmonary opacities   Flomax [Tamsulosin Hcl]        Latest Ref Rng & Units 03/06/2020    2:29 PM 10/14/2019    1:56 AM 11/09/2018    5:57 AM  CBC  WBC 4.0 - 10.5 K/uL 6.6  7.6  11.1   Hemoglobin 13.0 - 17.0 g/dL 16.1  09.6  04.5   Hematocrit 39.0 - 52.0 % 39.5  40.4  40.4   Platelets 150 - 400 K/uL 182  190  237       CMP     Component Value Date/Time   NA 140 03/07/2020 0426   K 3.3 (  L) 03/07/2020 0426   CL 103 03/07/2020 0426   CO2 29 03/07/2020 0426   GLUCOSE 92 03/07/2020 0426   BUN 19 06/29/2022 0744   CREATININE 1.25 (H) 06/29/2022 0744   CALCIUM 8.6 (L) 03/07/2020  0426   PROT 5.6 (L) 03/07/2020 0426   ALBUMIN 3.1 (L) 03/07/2020 0426   AST 14 (L) 03/07/2020 0426   ALT 9 03/07/2020 0426   ALKPHOS 71 03/07/2020 0426   BILITOT 0.8 03/07/2020 0426   GFRNONAA 60 (L) 06/29/2022 0744   GFRAA >60 10/14/2019 0156     VAS [place] ABI WITH/WO TBI  Result Date: 06/18/2022  LOWER EXTREMITY DOPPLER STUDY Patient Name:  GARLEN NAFFZIGER  Date of Exam:   06/16/2022 Medical Rec #: [id number]       Accession #:    [id number] Date of Birth: 1946-01-29       Patient Gender: M Patient Age:   54 years Exam Location:  [facility] Procedure:      VAS [place] ABI WITH/WO TBI Referring Phys: Festus Barren --------------------------------------------------------------------------------  Indications: Claudication. High Risk Factors: Hypertension, hyperlipidemia, past history of smoking.  Performing Technologist: Hardie Lora RVT  Examination Guidelines: A complete evaluation includes at minimum, Doppler waveform signals and systolic blood pressure reading at the level of bilateral brachial, anterior tibial, and posterior tibial arteries, when vessel segments are accessible. Bilateral testing is considered an integral part of a complete examination. Photoelectric Plethysmograph (PPG) waveforms and toe systolic pressure readings are included as required and additional duplex testing as needed. Limited examinations for reoccurring indications may be performed as noted.  ABI Findings: +---------+------------------+-----+--------+--------+ Right    Rt Pressure (mmHg)IndexWaveformComment  +---------+------------------+-----+--------+--------+ Brachial 117                                     +---------+------------------+-----+--------+--------+ PTA      114               0.96 biphasic         +---------+------------------+-----+--------+--------+ DP       105               0.88 biphasic         +---------+------------------+-----+--------+--------+ Great Toe0                  0.00                  +---------+------------------+-----+--------+--------+ +---------+------------------+-----+--------+-------+ Left     Lt Pressure (mmHg)IndexWaveformComment +---------+------------------+-----+--------+-------+ Brachial 119                                    +---------+------------------+-----+--------+-------+ PTA      97                0.82 biphasic        +---------+------------------+-----+--------+-------+ DP       90                0.76 biphasic        +---------+------------------+-----+--------+-------+ Great Toe0                 0.00                 +---------+------------------+-----+--------+-------+ +-------+-----------+-----------+------------+------------+ ABI/TBIToday's ABIToday's TBIPrevious ABIPrevious TBI +-------+-----------+-----------+------------+------------+ Right  0.96       0.00  1.18        0.57         +-------+-----------+-----------+------------+------------+ Left   0.82       0.00       0.77        0.57         +-------+-----------+-----------+------------+------------+ Bilateral ABIs appear essentially unchanged compared to prior study on 03/10/2022.  Summary: Right: Resting right ankle-brachial index is within normal range. The right toe-brachial index is abnormal. PPG tracings appear dampened. Left: Resting left ankle-brachial index indicates mild left lower extremity arterial disease. The left toe-brachial index is abnormal. PPG tracings appear dampened. *See table(s) above for measurements and observations.  Electronically signed by Festus Barren MD on 06/18/2022 at 8:55:44 AM.    Final        Assessment & Plan:   1. Atherosclerosis of native artery of both lower extremities with intermittent claudication (HCC) Recommend:  The patient is status post successful angiogram with intervention.  The patient reports that the claudication symptoms and leg pain has improved.   The patient denies lifestyle  limiting changes at this point in time.  No further invasive studies, angiography or surgery at this time The patient should continue walking and begin a more formal exercise program.  The patient should continue antiplatelet therapy and aggressive treatment of the lipid abnormalities  Continued surveillance is indicated as atherosclerosis is likely to progress with time.    Patient should undergo noninvasive studies as ordered. The patient will follow up with me to review the studies.   2. Hyperlipidemia, unspecified hyperlipidemia type Continue statin as ordered and reviewed, no changes at this time  3. Primary hypertension Continue antihypertensive medications as already ordered, these medications have been reviewed and there are no changes at this time.   Current Outpatient Medications on File Prior to Visit  Medication Sig Dispense Refill   allopurinol (ZYLOPRIM) 100 MG tablet Take 100 mg by mouth daily.     aspirin 81 MG tablet Take 1 tablet (81 mg total) by mouth daily. 30 tablet    atorvastatin (LIPITOR) 20 MG tablet Take 20 mg by mouth daily.  3   [employer] D3 50 MCG (2000 UT) CAPS Take 2,000 Units by mouth daily.     ELIQUIS 5 MG TABS tablet Take 5 mg by mouth 2 (two) times daily.     finasteride (PROSCAR) 5 MG tablet Take 1 tablet (5 mg total) by mouth daily. 30 tablet 3   furosemide (LASIX) 20 MG tablet Take 20 mg by mouth daily.     HYDROcodone-acetaminophen (NORCO/VICODIN) 5-325 MG tablet Take 1 tablet by mouth every 6 (six) hours as needed for moderate pain. 10 tablet 0   ipratropium (ATROVENT) 0.03 % nasal spray Place 1-2 sprays into both nostrils 2 (two) times daily as needed for rhinitis.     Potassium Chloride ER 20 MEQ TBCR Take 40 mEq by mouth daily.     rosuvastatin (CRESTOR) 10 MG tablet Take 10 mg by mouth daily.     TRELEGY ELLIPTA 100-62.5-25 MCG/INH AEPB Inhale 1 puff into the lungs daily as needed (respiratory problems).     venlafaxine XR (EFFEXOR-XR) 75 MG 24  hr capsule Take 75 mg by mouth daily.      vitamin B-12 (CYANOCOBALAMIN) 1000 MCG tablet Take 1,000 mcg by mouth daily.     dabigatran (PRADAXA) 150 MG CAPS capsule Take 1 capsule (150 mg total) by mouth 2 (two) times daily. (Patient not taking: Reported on 06/29/2022) 60 capsule  fludrocortisone (FLORINEF) 0.1 MG tablet Take 100 mcg by mouth daily. (Patient not taking: Reported on 06/29/2022)     metoprolol succinate (TOPROL-XL) 25 MG 24 hr tablet Take 1 tablet (25 mg total) by mouth 2 (two) times daily. This is an increase from 12.5 mg twice daily. 60 tablet 0   mexiletine (MEXITIL) 200 MG capsule Take 1 capsule (200 mg total) by mouth 3 (three) times daily. This is an increase from 2 times daily. 90 capsule 0   nitroGLYCERIN (NITROSTAT) 0.4 MG SL tablet Place 1 tablet (0.4 mg total) under the tongue every 5 (five) minutes as needed for chest pain. (Patient not taking: Reported on 06/29/2022)  12   No current facility-administered medications on file prior to visit.    There are no Patient Instructions on file for this visit. No follow-ups on file.   Georgiana Spinner, NP

## 2022-09-07 DEATH — deceased

## 2022-10-28 ENCOUNTER — Other Ambulatory Visit (INDEPENDENT_AMBULATORY_CARE_PROVIDER_SITE_OTHER): Payer: Self-pay | Admitting: Nurse Practitioner

## 2022-10-28 DIAGNOSIS — Z9889 Other specified postprocedural states: Secondary | ICD-10-CM

## 2022-10-29 ENCOUNTER — Encounter (INDEPENDENT_AMBULATORY_CARE_PROVIDER_SITE_OTHER): Payer: Medicare HMO

## 2022-10-29 ENCOUNTER — Ambulatory Visit (INDEPENDENT_AMBULATORY_CARE_PROVIDER_SITE_OTHER): Payer: Medicare HMO | Admitting: Nurse Practitioner

## 2023-04-23 ENCOUNTER — Ambulatory Visit: Payer: Medicare HMO | Admitting: Urology
